# Patient Record
Sex: Female | Born: 1935 | Race: White | Hispanic: No | Smoking: Former smoker
Health system: Southern US, Community
[De-identification: ages and names within clinical notes are randomized; demographics above are authoritative.]

## PROBLEM LIST (undated history)

## (undated) DIAGNOSIS — I509 Heart failure, unspecified: Secondary | ICD-10-CM

## (undated) DIAGNOSIS — F419 Anxiety disorder, unspecified: Secondary | ICD-10-CM

## (undated) DIAGNOSIS — E039 Hypothyroidism, unspecified: Secondary | ICD-10-CM

## (undated) DIAGNOSIS — M1990 Unspecified osteoarthritis, unspecified site: Secondary | ICD-10-CM

## (undated) DIAGNOSIS — C4490 Unspecified malignant neoplasm of skin, unspecified: Secondary | ICD-10-CM

## (undated) DIAGNOSIS — J449 Chronic obstructive pulmonary disease, unspecified: Secondary | ICD-10-CM

## (undated) DIAGNOSIS — M109 Gout, unspecified: Secondary | ICD-10-CM

## (undated) DIAGNOSIS — I4891 Unspecified atrial fibrillation: Secondary | ICD-10-CM

## (undated) DIAGNOSIS — K219 Gastro-esophageal reflux disease without esophagitis: Secondary | ICD-10-CM

## (undated) DIAGNOSIS — N35919 Unspecified urethral stricture, male, unspecified site: Secondary | ICD-10-CM

## (undated) DIAGNOSIS — I10 Essential (primary) hypertension: Secondary | ICD-10-CM

## (undated) DIAGNOSIS — E785 Hyperlipidemia, unspecified: Secondary | ICD-10-CM

## (undated) DIAGNOSIS — F32A Depression, unspecified: Secondary | ICD-10-CM

## (undated) DIAGNOSIS — N390 Urinary tract infection, site not specified: Secondary | ICD-10-CM

## (undated) DIAGNOSIS — F329 Major depressive disorder, single episode, unspecified: Secondary | ICD-10-CM

## (undated) HISTORY — DX: Unspecified osteoarthritis, unspecified site: M19.90

## (undated) HISTORY — DX: Essential (primary) hypertension: I10

## (undated) HISTORY — DX: Major depressive disorder, single episode, unspecified: F32.9

## (undated) HISTORY — PX: OTHER SURGICAL HISTORY: SHX169

## (undated) HISTORY — DX: Hyperlipidemia, unspecified: E78.5

## (undated) HISTORY — DX: Unspecified malignant neoplasm of skin, unspecified: C44.90

## (undated) HISTORY — DX: Hypothyroidism, unspecified: E03.9

## (undated) HISTORY — DX: Anxiety disorder, unspecified: F41.9

## (undated) HISTORY — PX: PARTIAL HYSTERECTOMY: SHX80

## (undated) HISTORY — DX: Heart failure, unspecified: I50.9

## (undated) HISTORY — DX: Chronic obstructive pulmonary disease, unspecified: J44.9

## (undated) HISTORY — PX: CHOLECYSTECTOMY: SHX55

## (undated) HISTORY — DX: Depression, unspecified: F32.A

## (undated) HISTORY — DX: Gastro-esophageal reflux disease without esophagitis: K21.9

---

## 2004-10-16 ENCOUNTER — Ambulatory Visit: Payer: Self-pay | Admitting: Anesthesiology

## 2005-03-26 ENCOUNTER — Ambulatory Visit: Payer: Self-pay | Admitting: Internal Medicine

## 2006-12-02 ENCOUNTER — Emergency Department: Payer: Self-pay | Admitting: Unknown Physician Specialty

## 2006-12-02 ENCOUNTER — Other Ambulatory Visit: Payer: Self-pay

## 2009-02-13 ENCOUNTER — Ambulatory Visit: Payer: Self-pay | Admitting: Internal Medicine

## 2009-07-25 ENCOUNTER — Emergency Department: Payer: Self-pay | Admitting: Emergency Medicine

## 2011-01-08 ENCOUNTER — Ambulatory Visit: Payer: Self-pay | Admitting: Gastroenterology

## 2011-01-09 LAB — PATHOLOGY REPORT

## 2011-07-30 ENCOUNTER — Emergency Department: Payer: Self-pay | Admitting: Emergency Medicine

## 2011-08-03 ENCOUNTER — Ambulatory Visit: Payer: Self-pay | Admitting: Internal Medicine

## 2011-08-13 ENCOUNTER — Inpatient Hospital Stay: Payer: Self-pay | Admitting: Internal Medicine

## 2011-08-31 ENCOUNTER — Ambulatory Visit: Payer: Self-pay | Admitting: Internal Medicine

## 2011-09-03 ENCOUNTER — Ambulatory Visit: Payer: Self-pay | Admitting: Internal Medicine

## 2012-01-14 ENCOUNTER — Other Ambulatory Visit: Payer: Self-pay | Admitting: Gastroenterology

## 2012-01-14 LAB — CLOSTRIDIUM DIFFICILE BY PCR

## 2012-01-15 LAB — WBCS, STOOL

## 2012-03-10 ENCOUNTER — Ambulatory Visit: Payer: Self-pay | Admitting: Gastroenterology

## 2012-03-11 LAB — PATHOLOGY REPORT

## 2012-06-30 ENCOUNTER — Ambulatory Visit: Payer: Self-pay | Admitting: Internal Medicine

## 2012-07-15 ENCOUNTER — Ambulatory Visit: Payer: Self-pay | Admitting: Internal Medicine

## 2012-07-15 LAB — CBC CANCER CENTER
Basophil #: 0.1 x10 3/mm (ref 0.0–0.1)
Eosinophil #: 0.3 x10 3/mm (ref 0.0–0.7)
HCT: 31.7 % — ABNORMAL LOW (ref 35.0–47.0)
Lymphocyte #: 2.5 x10 3/mm (ref 1.0–3.6)
Lymphocyte %: 23 %
MCHC: 31.5 g/dL — ABNORMAL LOW (ref 32.0–36.0)
Monocyte %: 5 %
Neutrophil #: 7.4 x10 3/mm — ABNORMAL HIGH (ref 1.4–6.5)
Neutrophil %: 69.1 %
Platelet: 297 x10 3/mm (ref 150–440)
RBC: 3.59 10*6/uL — ABNORMAL LOW (ref 3.80–5.20)
RDW: 17.1 % — ABNORMAL HIGH (ref 11.5–14.5)
WBC: 10.8 x10 3/mm (ref 3.6–11.0)

## 2012-07-15 LAB — RETICULOCYTES: Absolute Retic Count: 0.0541 10*6/uL (ref 0.023–0.096)

## 2012-07-15 LAB — IRON AND TIBC
Iron Bind.Cap.(Total): 374 ug/dL (ref 250–450)
Iron: 33 ug/dL — ABNORMAL LOW (ref 50–170)
Unbound Iron-Bind.Cap.: 341 ug/dL

## 2012-08-02 ENCOUNTER — Ambulatory Visit: Payer: Self-pay | Admitting: Internal Medicine

## 2012-09-02 ENCOUNTER — Ambulatory Visit: Payer: Self-pay | Admitting: Internal Medicine

## 2012-12-31 ENCOUNTER — Ambulatory Visit: Payer: Self-pay | Admitting: Internal Medicine

## 2013-08-30 ENCOUNTER — Other Ambulatory Visit: Payer: Self-pay | Admitting: Gastroenterology

## 2013-08-31 ENCOUNTER — Ambulatory Visit: Payer: Self-pay | Admitting: Gastroenterology

## 2013-10-16 ENCOUNTER — Inpatient Hospital Stay: Payer: Self-pay | Admitting: Internal Medicine

## 2013-10-16 LAB — IRON AND TIBC
Iron Bind.Cap.(Total): 393 ug/dL (ref 250–450)
Iron Saturation: 7 %
Unbound Iron-Bind.Cap.: 367 ug/dL

## 2013-10-16 LAB — PRO B NATRIURETIC PEPTIDE: B-Type Natriuretic Peptide: 270 pg/mL (ref 0–450)

## 2013-10-16 LAB — COMPREHENSIVE METABOLIC PANEL
Albumin: 3.3 g/dL — ABNORMAL LOW (ref 3.4–5.0)
Anion Gap: 4 — ABNORMAL LOW (ref 7–16)
BUN: 19 mg/dL — ABNORMAL HIGH (ref 7–18)
Bilirubin,Total: 0.3 mg/dL (ref 0.2–1.0)
Calcium, Total: 9.7 mg/dL (ref 8.5–10.1)
Chloride: 99 mmol/L (ref 98–107)
Co2: 33 mmol/L — ABNORMAL HIGH (ref 21–32)
Creatinine: 1.24 mg/dL (ref 0.60–1.30)
EGFR (African American): 49 — ABNORMAL LOW
Glucose: 102 mg/dL — ABNORMAL HIGH (ref 65–99)
Osmolality: 274 (ref 275–301)
SGOT(AST): 14 U/L — ABNORMAL LOW (ref 15–37)
Sodium: 136 mmol/L (ref 136–145)
Total Protein: 7.9 g/dL (ref 6.4–8.2)

## 2013-10-16 LAB — CBC
HCT: 25 % — ABNORMAL LOW (ref 35.0–47.0)
HGB: 7.8 g/dL — ABNORMAL LOW (ref 12.0–16.0)
MCH: 26.1 pg (ref 26.0–34.0)
MCV: 83 fL (ref 80–100)
RDW: 17.3 % — ABNORMAL HIGH (ref 11.5–14.5)

## 2013-10-16 LAB — FERRITIN: Ferritin (ARMC): 9 ng/mL (ref 8–388)

## 2013-10-16 LAB — TROPONIN I: Troponin-I: <0.02 ng/mL

## 2013-10-17 LAB — CBC WITH DIFFERENTIAL/PLATELET
Basophil #: 0 10*3/uL (ref 0.0–0.1)
Eosinophil #: 0 10*3/uL (ref 0.0–0.7)
Eosinophil %: 0 %
HCT: 23.1 % — ABNORMAL LOW (ref 35.0–47.0)
HGB: 7.4 g/dL — ABNORMAL LOW (ref 12.0–16.0)
Lymphocyte #: 1.2 10*3/uL (ref 1.0–3.6)
MCH: 26.1 pg (ref 26.0–34.0)
MCHC: 32 g/dL (ref 32.0–36.0)
Monocyte #: 0.1 x10 3/mm — ABNORMAL LOW (ref 0.2–0.9)
Monocyte %: 0.6 %
Neutrophil #: 13.8 10*3/uL — ABNORMAL HIGH (ref 1.4–6.5)
Platelet: 353 10*3/uL (ref 150–440)
RDW: 17.7 % — ABNORMAL HIGH (ref 11.5–14.5)
WBC: 15.2 10*3/uL — ABNORMAL HIGH (ref 3.6–11.0)

## 2013-10-17 LAB — BASIC METABOLIC PANEL
Anion Gap: 7 (ref 7–16)
BUN: 19 mg/dL — ABNORMAL HIGH (ref 7–18)
Calcium, Total: 9.9 mg/dL (ref 8.5–10.1)
Co2: 30 mmol/L (ref 21–32)
EGFR (African American): 57 — ABNORMAL LOW
Glucose: 144 mg/dL — ABNORMAL HIGH (ref 65–99)
Osmolality: 273 (ref 275–301)
Potassium: 4.5 mmol/L (ref 3.5–5.1)
Sodium: 134 mmol/L — ABNORMAL LOW (ref 136–145)

## 2013-10-18 LAB — BASIC METABOLIC PANEL
Anion Gap: 4 — ABNORMAL LOW (ref 7–16)
Chloride: 94 mmol/L — ABNORMAL LOW (ref 98–107)
Co2: 32 mmol/L (ref 21–32)
EGFR (African American): 48 — ABNORMAL LOW
EGFR (Non-African Amer.): 41 — ABNORMAL LOW
Glucose: 164 mg/dL — ABNORMAL HIGH (ref 65–99)
Osmolality: 271 (ref 275–301)
Potassium: 4 mmol/L (ref 3.5–5.1)
Sodium: 130 mmol/L — ABNORMAL LOW (ref 136–145)

## 2013-10-18 LAB — CBC WITH DIFFERENTIAL/PLATELET
Eosinophil #: 0 10*3/uL (ref 0.0–0.7)
HCT: 22.8 % — ABNORMAL LOW (ref 35.0–47.0)
HGB: 6.8 g/dL — ABNORMAL LOW (ref 12.0–16.0)
Lymphocyte #: 1.8 10*3/uL (ref 1.0–3.6)
MCV: 82 fL (ref 80–100)
Monocyte #: 0.6 x10 3/mm (ref 0.2–0.9)
Neutrophil %: 86.5 %
RDW: 17.5 % — ABNORMAL HIGH (ref 11.5–14.5)
WBC: 18.3 10*3/uL — ABNORMAL HIGH (ref 3.6–11.0)

## 2013-10-18 LAB — IRON AND TIBC
Iron Bind.Cap.(Total): 331 ug/dL (ref 250–450)
Iron Saturation: 10 %
Iron: 32 ug/dL — ABNORMAL LOW (ref 50–170)
Unbound Iron-Bind.Cap.: 299 ug/dL

## 2013-10-18 LAB — FOLATE: Folic Acid: 10 ng/mL (ref 3.1–100.0)

## 2013-10-19 LAB — RETICULOCYTES
Absolute Retic Count: 0.069 10*6/uL (ref 0.019–0.186)
Reticulocyte: 2.18 % (ref 0.4–3.1)

## 2013-10-19 LAB — BASIC METABOLIC PANEL
Calcium, Total: 9.3 mg/dL (ref 8.5–10.1)
Chloride: 95 mmol/L — ABNORMAL LOW (ref 98–107)
Co2: 31 mmol/L (ref 21–32)
EGFR (African American): 51 — ABNORMAL LOW
Glucose: 172 mg/dL — ABNORMAL HIGH (ref 65–99)
Osmolality: 277 (ref 275–301)
Potassium: 3.9 mmol/L (ref 3.5–5.1)

## 2013-10-19 LAB — URINALYSIS, COMPLETE
Glucose,UR: NEGATIVE mg/dL (ref 0–75)
Nitrite: NEGATIVE
Protein: NEGATIVE
Specific Gravity: 1.006 (ref 1.003–1.030)
WBC UR: 8 /HPF (ref 0–5)

## 2013-10-19 LAB — CBC WITH DIFFERENTIAL/PLATELET
Basophil #: 0 10*3/uL (ref 0.0–0.1)
Basophil %: 0.1 %
Eosinophil #: 0 10*3/uL (ref 0.0–0.7)
HCT: 26 % — ABNORMAL LOW (ref 35.0–47.0)
HGB: 8.2 g/dL — ABNORMAL LOW (ref 12.0–16.0)
Lymphocyte %: 9.9 %
MCH: 25.9 pg — ABNORMAL LOW (ref 26.0–34.0)
MCHC: 31.5 g/dL — ABNORMAL LOW (ref 32.0–36.0)
MCV: 82 fL (ref 80–100)
Monocyte #: 0.7 x10 3/mm (ref 0.2–0.9)
Neutrophil #: 15.3 10*3/uL — ABNORMAL HIGH (ref 1.4–6.5)
Neutrophil %: 86.3 %
Platelet: 379 10*3/uL (ref 150–440)
RBC: 3.15 10*6/uL — ABNORMAL LOW (ref 3.80–5.20)
WBC: 17.7 10*3/uL — ABNORMAL HIGH (ref 3.6–11.0)

## 2013-10-19 LAB — T4, FREE: Free Thyroxine: 1.1 ng/dL (ref 0.76–1.46)

## 2013-10-20 LAB — CBC WITH DIFFERENTIAL/PLATELET
HCT: 28.7 % — ABNORMAL LOW (ref 35.0–47.0)
Lymphocytes: 17 %
MCH: 26 pg (ref 26.0–34.0)
MCV: 83 fL (ref 80–100)
Monocytes: 6 %
Myelocyte: 2 %
Platelet: 450 10*3/uL — ABNORMAL HIGH (ref 150–440)
RBC: 3.46 10*6/uL — ABNORMAL LOW (ref 3.80–5.20)
WBC: 25.6 10*3/uL — ABNORMAL HIGH (ref 3.6–11.0)

## 2013-10-20 LAB — BASIC METABOLIC PANEL
Anion Gap: 5 — ABNORMAL LOW (ref 7–16)
Calcium, Total: 9.4 mg/dL (ref 8.5–10.1)
Creatinine: 1.14 mg/dL (ref 0.60–1.30)
EGFR (Non-African Amer.): 47 — ABNORMAL LOW
Glucose: 130 mg/dL — ABNORMAL HIGH (ref 65–99)
Osmolality: 285 (ref 275–301)

## 2013-10-21 LAB — CBC WITH DIFFERENTIAL/PLATELET
Basophil #: 0.1 10*3/uL (ref 0.0–0.1)
Eosinophil #: 0 10*3/uL (ref 0.0–0.7)
Eosinophil %: 0.1 %
HCT: 28.3 % — ABNORMAL LOW (ref 35.0–47.0)
HGB: 8.6 g/dL — ABNORMAL LOW (ref 12.0–16.0)
Lymphocyte #: 4.4 10*3/uL — ABNORMAL HIGH (ref 1.0–3.6)
Lymphocyte %: 15.9 %
MCH: 25.3 pg — ABNORMAL LOW (ref 26.0–34.0)
MCHC: 30.2 g/dL — ABNORMAL LOW (ref 32.0–36.0)
MCV: 84 fL (ref 80–100)
Monocyte #: 2.2 x10 3/mm — ABNORMAL HIGH (ref 0.2–0.9)
Monocyte %: 7.8 %
Neutrophil #: 21.1 10*3/uL — ABNORMAL HIGH (ref 1.4–6.5)
Neutrophil %: 75.7 %
RBC: 3.37 10*6/uL — ABNORMAL LOW (ref 3.80–5.20)
WBC: 27.9 10*3/uL — ABNORMAL HIGH (ref 3.6–11.0)

## 2013-10-21 LAB — BASIC METABOLIC PANEL WITH GFR
Anion Gap: 4 — ABNORMAL LOW (ref 7–16)
BUN: 38 mg/dL — ABNORMAL HIGH (ref 7–18)
Calcium, Total: 9.1 mg/dL (ref 8.5–10.1)
Chloride: 100 mmol/L (ref 98–107)
Co2: 32 mmol/L (ref 21–32)
Creatinine: 1.16 mg/dL (ref 0.60–1.30)
EGFR (African American): 53 — ABNORMAL LOW
EGFR (Non-African Amer.): 46 — ABNORMAL LOW
Glucose: 126 mg/dL — ABNORMAL HIGH (ref 65–99)
Osmolality: 283 (ref 275–301)
Potassium: 3.9 mmol/L (ref 3.5–5.1)
Sodium: 136 mmol/L (ref 136–145)

## 2013-10-21 LAB — CULTURE, BLOOD (SINGLE)

## 2013-10-22 DIAGNOSIS — R079 Chest pain, unspecified: Secondary | ICD-10-CM

## 2013-10-22 LAB — TROPONIN I: Troponin-I: <0.02 ng/mL

## 2013-10-22 LAB — CK TOTAL AND CKMB (NOT AT ARMC): CK, Total: 41 U/L (ref 21–215)

## 2013-10-23 LAB — CK TOTAL AND CKMB (NOT AT ARMC)
CK, Total: 33 U/L (ref 21–215)
CK-MB: 0.8 ng/mL (ref 0.5–3.6)

## 2013-10-23 LAB — CBC WITH DIFFERENTIAL/PLATELET
Basophil #: 0.1 10*3/uL (ref 0.0–0.1)
Eosinophil #: 0.3 10*3/uL (ref 0.0–0.7)
Eosinophil %: 0.9 %
MCHC: 30.2 g/dL — ABNORMAL LOW (ref 32.0–36.0)
Monocyte #: 1.8 x10 3/mm — ABNORMAL HIGH (ref 0.2–0.9)
Neutrophil #: 22.3 10*3/uL — ABNORMAL HIGH (ref 1.4–6.5)
RDW: 17.6 % — ABNORMAL HIGH (ref 11.5–14.5)
WBC: 29.3 10*3/uL — ABNORMAL HIGH (ref 3.6–11.0)

## 2013-10-23 LAB — BASIC METABOLIC PANEL
Anion Gap: 2 — ABNORMAL LOW (ref 7–16)
Calcium, Total: 9.1 mg/dL (ref 8.5–10.1)
EGFR (African American): 58 — ABNORMAL LOW
Glucose: 99 mg/dL (ref 65–99)
Potassium: 3.6 mmol/L (ref 3.5–5.1)
Sodium: 136 mmol/L (ref 136–145)

## 2013-10-23 LAB — TROPONIN I
Troponin-I: <0.02 ng/mL
Troponin-I: <0.02 ng/mL

## 2013-10-24 LAB — CBC WITH DIFFERENTIAL/PLATELET
Eosinophil: 2 %
HCT: 24.8 % — ABNORMAL LOW (ref 35.0–47.0)
MCH: 26.6 pg (ref 26.0–34.0)
Monocytes: 5 %
Platelet: 342 10*3/uL (ref 150–440)
RDW: 18 % — ABNORMAL HIGH (ref 11.5–14.5)
Segmented Neutrophils: 78 %
WBC: 21.9 10*3/uL — ABNORMAL HIGH (ref 3.6–11.0)

## 2013-10-24 LAB — BASIC METABOLIC PANEL
Anion Gap: 3 — ABNORMAL LOW (ref 7–16)
Calcium, Total: 8.8 mg/dL (ref 8.5–10.1)
Chloride: 104 mmol/L (ref 98–107)
Co2: 30 mmol/L (ref 21–32)
EGFR (African American): >60
Glucose: 119 mg/dL — ABNORMAL HIGH (ref 65–99)
Osmolality: 280 (ref 275–301)

## 2013-11-16 ENCOUNTER — Inpatient Hospital Stay: Payer: Self-pay | Admitting: Internal Medicine

## 2013-11-16 LAB — COMPREHENSIVE METABOLIC PANEL
ANION GAP: 6 — AB (ref 7–16)
Albumin: 3.1 g/dL — ABNORMAL LOW (ref 3.4–5.0)
Alkaline Phosphatase: 133 U/L — ABNORMAL HIGH
BUN: 34 mg/dL — AB (ref 7–18)
Bilirubin,Total: 0.4 mg/dL (ref 0.2–1.0)
Calcium, Total: 9.8 mg/dL (ref 8.5–10.1)
Chloride: 102 mmol/L (ref 98–107)
Co2: 29 mmol/L (ref 21–32)
Creatinine: 1.44 mg/dL — ABNORMAL HIGH (ref 0.60–1.30)
EGFR (African American): 41 — ABNORMAL LOW
GFR CALC NON AF AMER: 35 — AB
Glucose: 107 mg/dL — ABNORMAL HIGH (ref 65–99)
OSMOLALITY: 282 (ref 275–301)
POTASSIUM: 3.8 mmol/L (ref 3.5–5.1)
SGOT(AST): 30 U/L (ref 15–37)
SGPT (ALT): 27 U/L (ref 12–78)
Sodium: 137 mmol/L (ref 136–145)
Total Protein: 7.5 g/dL (ref 6.4–8.2)

## 2013-11-16 LAB — CBC WITH DIFFERENTIAL/PLATELET
BASOS ABS: 0.1 10*3/uL (ref 0.0–0.1)
Basophil %: 0.8 %
EOS PCT: 3.1 %
Eosinophil #: 0.3 10*3/uL (ref 0.0–0.7)
HCT: 30.1 % — ABNORMAL LOW (ref 35.0–47.0)
HGB: 9.4 g/dL — ABNORMAL LOW (ref 12.0–16.0)
Lymphocyte #: 2.7 10*3/uL (ref 1.0–3.6)
Lymphocyte %: 24.5 %
MCH: 26.4 pg (ref 26.0–34.0)
MCHC: 31.2 g/dL — ABNORMAL LOW (ref 32.0–36.0)
MCV: 85 fL (ref 80–100)
Monocyte #: 0.7 x10 3/mm (ref 0.2–0.9)
Monocyte %: 6.4 %
NEUTROS ABS: 7.3 10*3/uL — AB (ref 1.4–6.5)
Neutrophil %: 65.2 %
Platelet: 436 10*3/uL (ref 150–440)
RBC: 3.56 10*6/uL — ABNORMAL LOW (ref 3.80–5.20)
RDW: 18.7 % — ABNORMAL HIGH (ref 11.5–14.5)
WBC: 11.2 10*3/uL — ABNORMAL HIGH (ref 3.6–11.0)

## 2013-11-16 LAB — TROPONIN I
Troponin-I: <0.02 ng/mL
Troponin-I: <0.02 ng/mL

## 2013-11-16 LAB — CK-MB
CK-MB: 0.7 ng/mL (ref 0.5–3.6)
CK-MB: 0.5 ng/mL (ref 0.5–3.6)

## 2013-11-17 LAB — URINALYSIS, COMPLETE
BILIRUBIN, UR: NEGATIVE
GLUCOSE, UR: NEGATIVE mg/dL (ref 0–75)
Hyaline Cast: 15
Ketone: NEGATIVE
LEUKOCYTE ESTERASE: NEGATIVE
Nitrite: NEGATIVE
Ph: 5 (ref 4.5–8.0)
Protein: NEGATIVE
SPECIFIC GRAVITY: 1.014 (ref 1.003–1.030)
Squamous Epithelial: 10
WBC UR: 2 /HPF (ref 0–5)

## 2013-11-17 LAB — CBC WITH DIFFERENTIAL/PLATELET
BASOS ABS: 0.1 10*3/uL (ref 0.0–0.1)
BASOS PCT: 0.7 %
Eosinophil #: 0.3 10*3/uL (ref 0.0–0.7)
Eosinophil %: 3 %
HCT: 25.3 % — AB (ref 35.0–47.0)
HGB: 7.8 g/dL — AB (ref 12.0–16.0)
Lymphocyte #: 3.5 10*3/uL (ref 1.0–3.6)
Lymphocyte %: 31.8 %
MCH: 25.8 pg — ABNORMAL LOW (ref 26.0–34.0)
MCHC: 30.7 g/dL — AB (ref 32.0–36.0)
MCV: 84 fL (ref 80–100)
MONO ABS: 0.9 x10 3/mm (ref 0.2–0.9)
MONOS PCT: 8.6 %
NEUTROS PCT: 55.9 %
Neutrophil #: 6.2 10*3/uL (ref 1.4–6.5)
Platelet: 357 10*3/uL (ref 150–440)
RBC: 3.01 10*6/uL — AB (ref 3.80–5.20)
RDW: 18.3 % — AB (ref 11.5–14.5)
WBC: 11 10*3/uL (ref 3.6–11.0)

## 2013-11-17 LAB — BASIC METABOLIC PANEL
Anion Gap: 5 — ABNORMAL LOW (ref 7–16)
BUN: 37 mg/dL — ABNORMAL HIGH (ref 7–18)
CALCIUM: 8.8 mg/dL (ref 8.5–10.1)
CHLORIDE: 104 mmol/L (ref 98–107)
Co2: 29 mmol/L (ref 21–32)
Creatinine: 1.84 mg/dL — ABNORMAL HIGH (ref 0.60–1.30)
EGFR (African American): 30 — ABNORMAL LOW
EGFR (Non-African Amer.): 26 — ABNORMAL LOW
Glucose: 95 mg/dL (ref 65–99)
OSMOLALITY: 284 (ref 275–301)
POTASSIUM: 3.5 mmol/L (ref 3.5–5.1)
SODIUM: 138 mmol/L (ref 136–145)

## 2013-11-17 LAB — TROPONIN I

## 2013-11-17 LAB — CK-MB: CK-MB: 0.7 ng/mL (ref 0.5–3.6)

## 2013-11-18 LAB — CBC WITH DIFFERENTIAL/PLATELET
Basophil #: 0.1 10*3/uL (ref 0.0–0.1)
Basophil %: 0.5 %
EOS ABS: 0.4 10*3/uL (ref 0.0–0.7)
Eosinophil %: 2.9 %
HCT: 21.9 % — ABNORMAL LOW (ref 35.0–47.0)
HGB: 7 g/dL — AB (ref 12.0–16.0)
LYMPHS PCT: 28.4 %
Lymphocyte #: 3.7 10*3/uL — ABNORMAL HIGH (ref 1.0–3.6)
MCH: 26.7 pg (ref 26.0–34.0)
MCHC: 31.9 g/dL — ABNORMAL LOW (ref 32.0–36.0)
MCV: 84 fL (ref 80–100)
MONOS PCT: 7.5 %
Monocyte #: 1 x10 3/mm — ABNORMAL HIGH (ref 0.2–0.9)
NEUTROS ABS: 7.8 10*3/uL — AB (ref 1.4–6.5)
Neutrophil %: 60.7 %
Platelet: 333 10*3/uL (ref 150–440)
RBC: 2.62 10*6/uL — ABNORMAL LOW (ref 3.80–5.20)
RDW: 18.3 % — ABNORMAL HIGH (ref 11.5–14.5)
WBC: 12.9 10*3/uL — ABNORMAL HIGH (ref 3.6–11.0)

## 2013-11-18 LAB — CREATININE, SERUM
Creatinine: 2.67 mg/dL — ABNORMAL HIGH (ref 0.60–1.30)
EGFR (Non-African Amer.): 17 — ABNORMAL LOW
GFR CALC AF AMER: 19 — AB

## 2013-11-18 LAB — URINE CULTURE

## 2013-11-19 LAB — BASIC METABOLIC PANEL
Anion Gap: 8 (ref 7–16)
BUN: 53 mg/dL — ABNORMAL HIGH (ref 7–18)
CALCIUM: 8.8 mg/dL (ref 8.5–10.1)
CHLORIDE: 101 mmol/L (ref 98–107)
Co2: 25 mmol/L (ref 21–32)
Creatinine: 1.96 mg/dL — ABNORMAL HIGH (ref 0.60–1.30)
EGFR (African American): 28 — ABNORMAL LOW
EGFR (Non-African Amer.): 24 — ABNORMAL LOW
GLUCOSE: 87 mg/dL (ref 65–99)
Osmolality: 282 (ref 275–301)
POTASSIUM: 4.4 mmol/L (ref 3.5–5.1)
Sodium: 134 mmol/L — ABNORMAL LOW (ref 136–145)

## 2013-11-19 LAB — CBC WITH DIFFERENTIAL/PLATELET
BASOS ABS: 0.1 10*3/uL (ref 0.0–0.1)
Basophil %: 0.5 %
Eosinophil #: 0.4 10*3/uL (ref 0.0–0.7)
Eosinophil %: 2.9 %
HCT: 24.8 % — ABNORMAL LOW (ref 35.0–47.0)
HGB: 8 g/dL — ABNORMAL LOW (ref 12.0–16.0)
Lymphocyte #: 3.7 10*3/uL — ABNORMAL HIGH (ref 1.0–3.6)
Lymphocyte %: 28.5 %
MCH: 26.9 pg (ref 26.0–34.0)
MCHC: 32.3 g/dL (ref 32.0–36.0)
MCV: 83 fL (ref 80–100)
MONOS PCT: 7.6 %
Monocyte #: 1 x10 3/mm — ABNORMAL HIGH (ref 0.2–0.9)
Neutrophil #: 7.9 10*3/uL — ABNORMAL HIGH (ref 1.4–6.5)
Neutrophil %: 60.5 %
Platelet: 337 10*3/uL (ref 150–440)
RBC: 2.97 10*6/uL — AB (ref 3.80–5.20)
RDW: 18.6 % — AB (ref 11.5–14.5)
WBC: 13.1 10*3/uL — ABNORMAL HIGH (ref 3.6–11.0)

## 2013-11-20 LAB — CBC WITH DIFFERENTIAL/PLATELET
BASOS ABS: 0.1 10*3/uL (ref 0.0–0.1)
Basophil %: 0.6 %
EOS PCT: 3.2 %
Eosinophil #: 0.4 10*3/uL (ref 0.0–0.7)
HCT: 26.2 % — ABNORMAL LOW (ref 35.0–47.0)
HGB: 8.1 g/dL — AB (ref 12.0–16.0)
Lymphocyte #: 2.6 10*3/uL (ref 1.0–3.6)
Lymphocyte %: 23.2 %
MCH: 26.4 pg (ref 26.0–34.0)
MCHC: 30.9 g/dL — ABNORMAL LOW (ref 32.0–36.0)
MCV: 86 fL (ref 80–100)
Monocyte #: 1 x10 3/mm — ABNORMAL HIGH (ref 0.2–0.9)
Monocyte %: 9 %
NEUTROS ABS: 7.2 10*3/uL — AB (ref 1.4–6.5)
NEUTROS PCT: 64 %
PLATELETS: 352 10*3/uL (ref 150–440)
RBC: 3.06 10*6/uL — ABNORMAL LOW (ref 3.80–5.20)
RDW: 18.6 % — ABNORMAL HIGH (ref 11.5–14.5)
WBC: 11.2 10*3/uL — ABNORMAL HIGH (ref 3.6–11.0)

## 2013-11-20 LAB — BASIC METABOLIC PANEL
ANION GAP: 4 — AB (ref 7–16)
BUN: 42 mg/dL — ABNORMAL HIGH (ref 7–18)
CREATININE: 1.46 mg/dL — AB (ref 0.60–1.30)
Calcium, Total: 9.6 mg/dL (ref 8.5–10.1)
Chloride: 105 mmol/L (ref 98–107)
Co2: 27 mmol/L (ref 21–32)
EGFR (African American): 40 — ABNORMAL LOW
EGFR (Non-African Amer.): 35 — ABNORMAL LOW
Glucose: 99 mg/dL (ref 65–99)
Osmolality: 282 (ref 275–301)
Potassium: 4.7 mmol/L (ref 3.5–5.1)
Sodium: 136 mmol/L (ref 136–145)

## 2013-11-21 LAB — BASIC METABOLIC PANEL
ANION GAP: 4 — AB (ref 7–16)
BUN: 31 mg/dL — ABNORMAL HIGH (ref 7–18)
Calcium, Total: 9.6 mg/dL (ref 8.5–10.1)
Chloride: 105 mmol/L (ref 98–107)
Co2: 27 mmol/L (ref 21–32)
Creatinine: 1.13 mg/dL (ref 0.60–1.30)
GFR CALC AF AMER: 55 — AB
GFR CALC NON AF AMER: 47 — AB
GLUCOSE: 100 mg/dL — AB (ref 65–99)
Osmolality: 279 (ref 275–301)
Potassium: 4.8 mmol/L (ref 3.5–5.1)
Sodium: 136 mmol/L (ref 136–145)

## 2013-11-21 LAB — CBC WITH DIFFERENTIAL/PLATELET
BASOS PCT: 0.5 %
Basophil #: 0.1 10*3/uL (ref 0.0–0.1)
EOS ABS: 0.3 10*3/uL (ref 0.0–0.7)
Eosinophil %: 3.4 %
HCT: 27.3 % — AB (ref 35.0–47.0)
HGB: 8.6 g/dL — AB (ref 12.0–16.0)
LYMPHS PCT: 24.9 %
Lymphocyte #: 2.6 10*3/uL (ref 1.0–3.6)
MCH: 27.1 pg (ref 26.0–34.0)
MCHC: 31.6 g/dL — AB (ref 32.0–36.0)
MCV: 86 fL (ref 80–100)
MONOS PCT: 10.1 %
Monocyte #: 1.1 x10 3/mm — ABNORMAL HIGH (ref 0.2–0.9)
NEUTROS ABS: 6.4 10*3/uL (ref 1.4–6.5)
NEUTROS PCT: 61.1 %
Platelet: 359 10*3/uL (ref 150–440)
RBC: 3.18 10*6/uL — ABNORMAL LOW (ref 3.80–5.20)
RDW: 18.4 % — AB (ref 11.5–14.5)
WBC: 10.4 10*3/uL (ref 3.6–11.0)

## 2014-04-05 ENCOUNTER — Ambulatory Visit: Payer: Self-pay | Admitting: Pain Medicine

## 2014-04-10 IMAGING — CR RIGHT HIP - COMPLETE 2+ VIEW
1 series · 5 of 5 positions shown · non-contrast
Comparison: None.

CLINICAL DATA: Pain status post fall

EXAM:
RIGHT HIP - COMPLETE 2+ VIEW

[Series 1: t pelvis ap · 0.14mm/px · 5 of 5 slices shown]
[im 1/5]
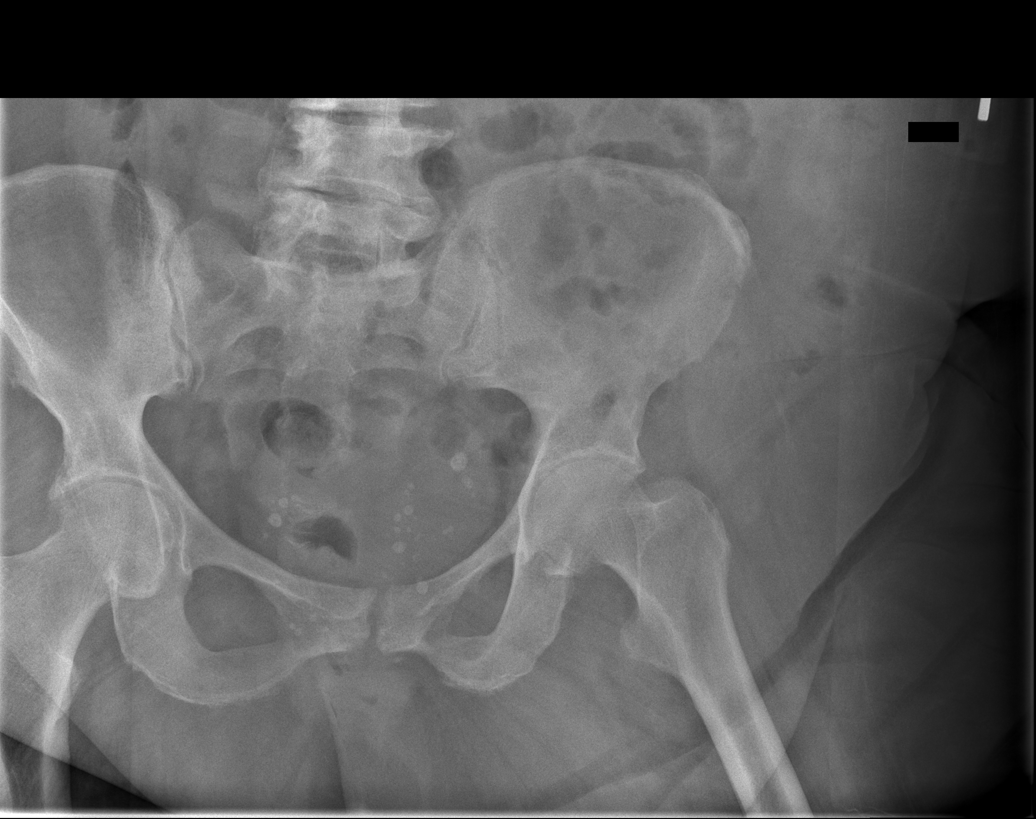
[im 2/5]
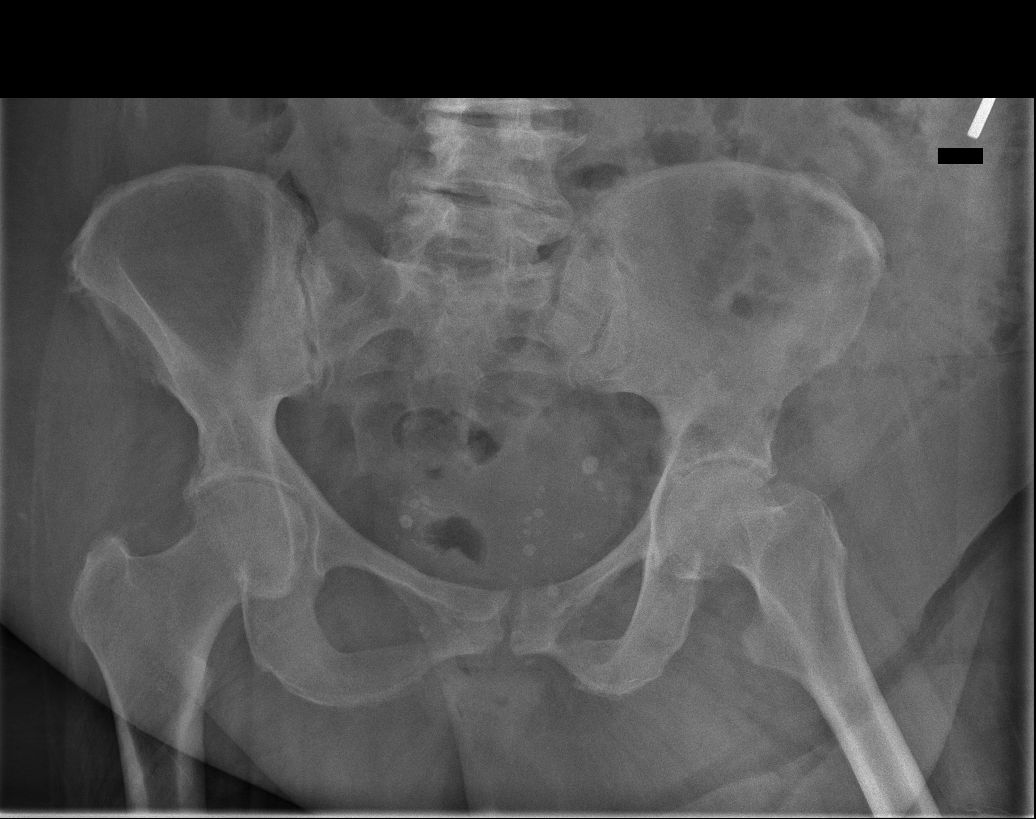
[im 3/5]
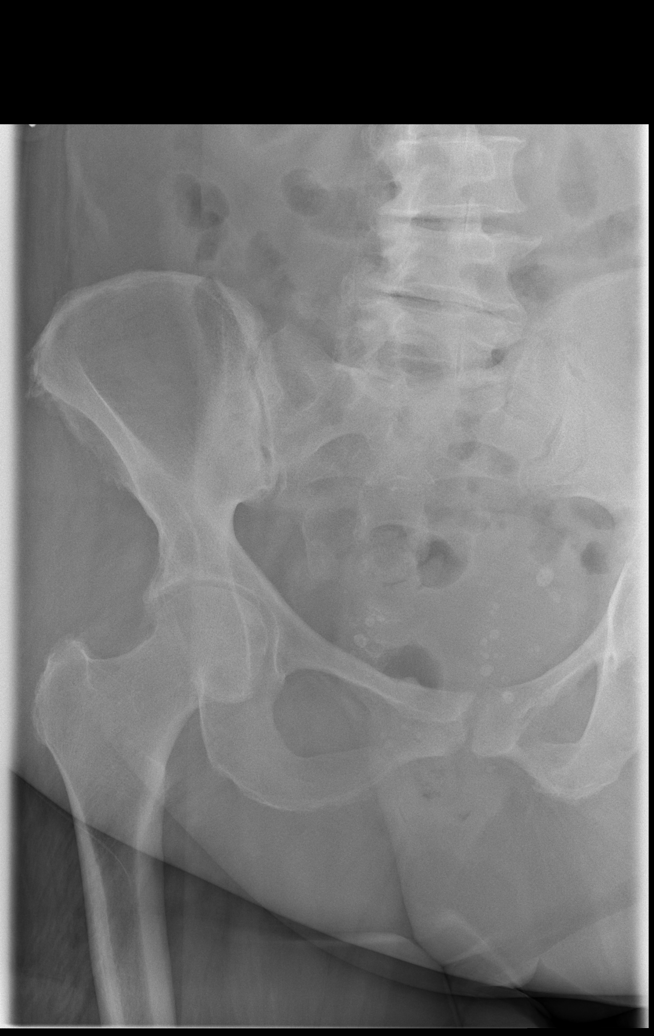
[im 4/5]
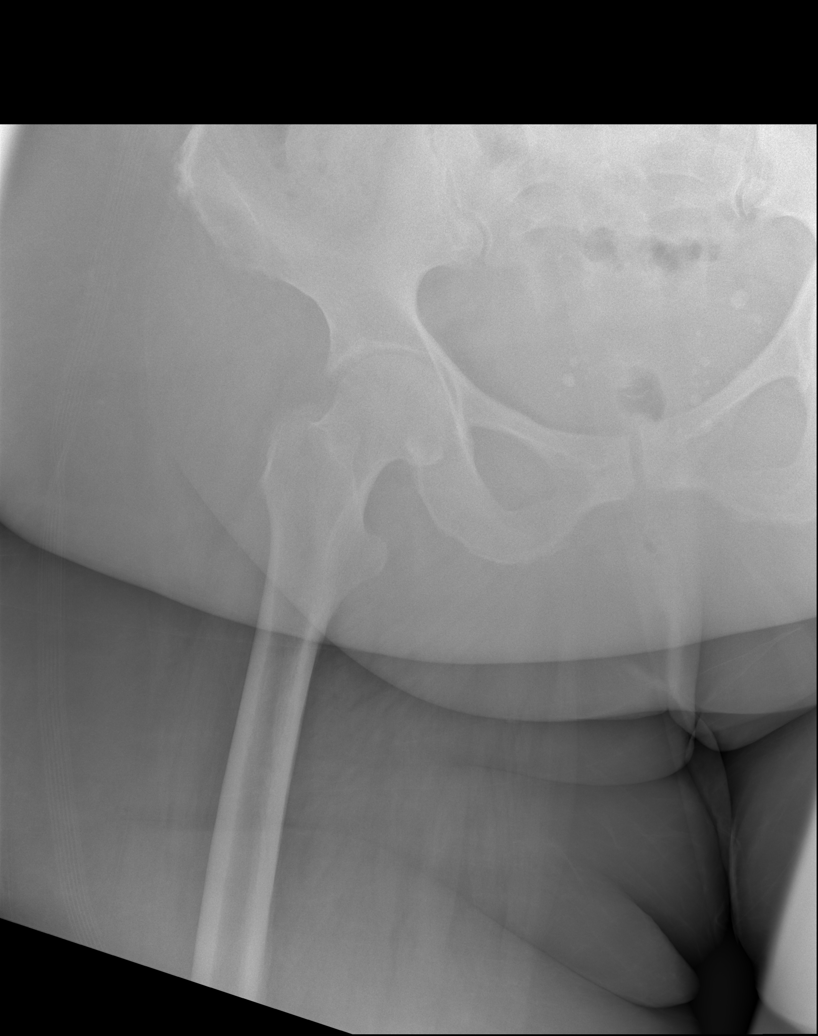
[im 5/5]
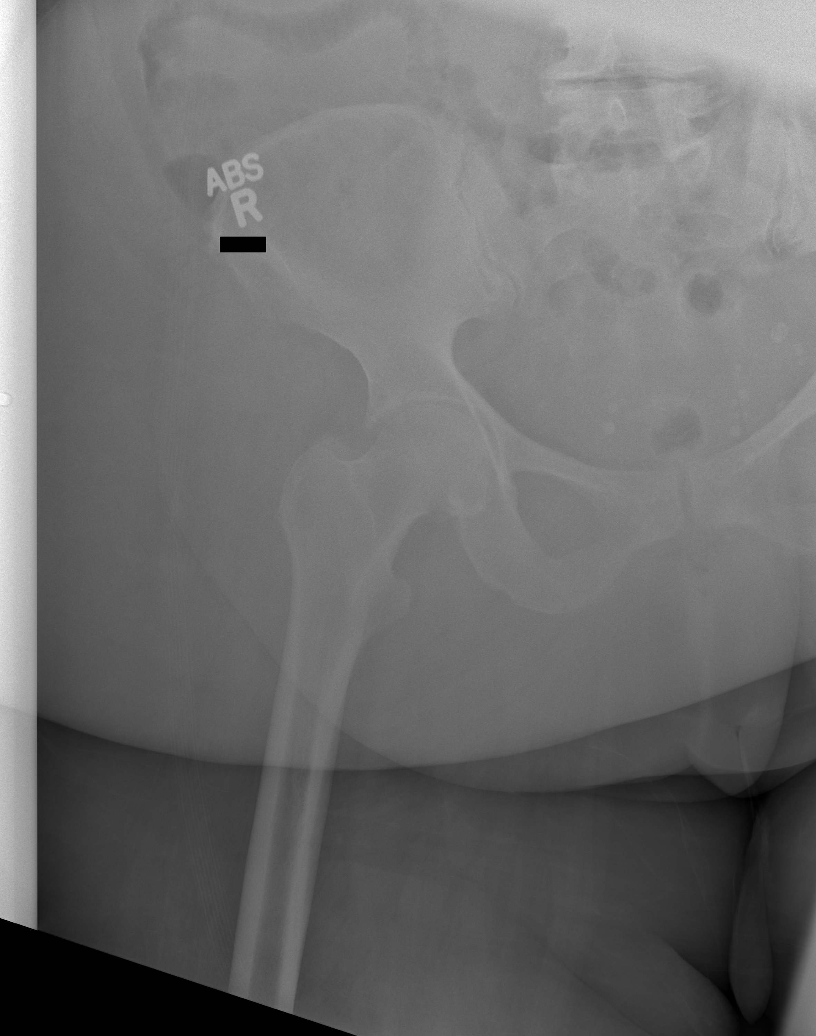

[5 of 5 positions shown; findings below may reference images not displayed]

FINDINGS: There is no evidence of hip fracture or dislocation. There is short
joint space narrowing, subchondral sclerosis and areas of
hypertrophic mild spurring.
IMPRESSION: No evidence of acute osseous abnormalities. Osteoarthritic changes
are appreciated.

## 2014-04-10 IMAGING — CR DG CHEST 1V
1 series · 1 of 1 positions shown · non-contrast
Comparison: Two-view chest x-ray 10/19/2013. Portable chest x-ray
10/16/2013. CTA chest 08/10/2011.

CLINICAL DATA: Recent fall.  Shortness of breath.

EXAM:
CHEST - 1 VIEW

[t chest supine]
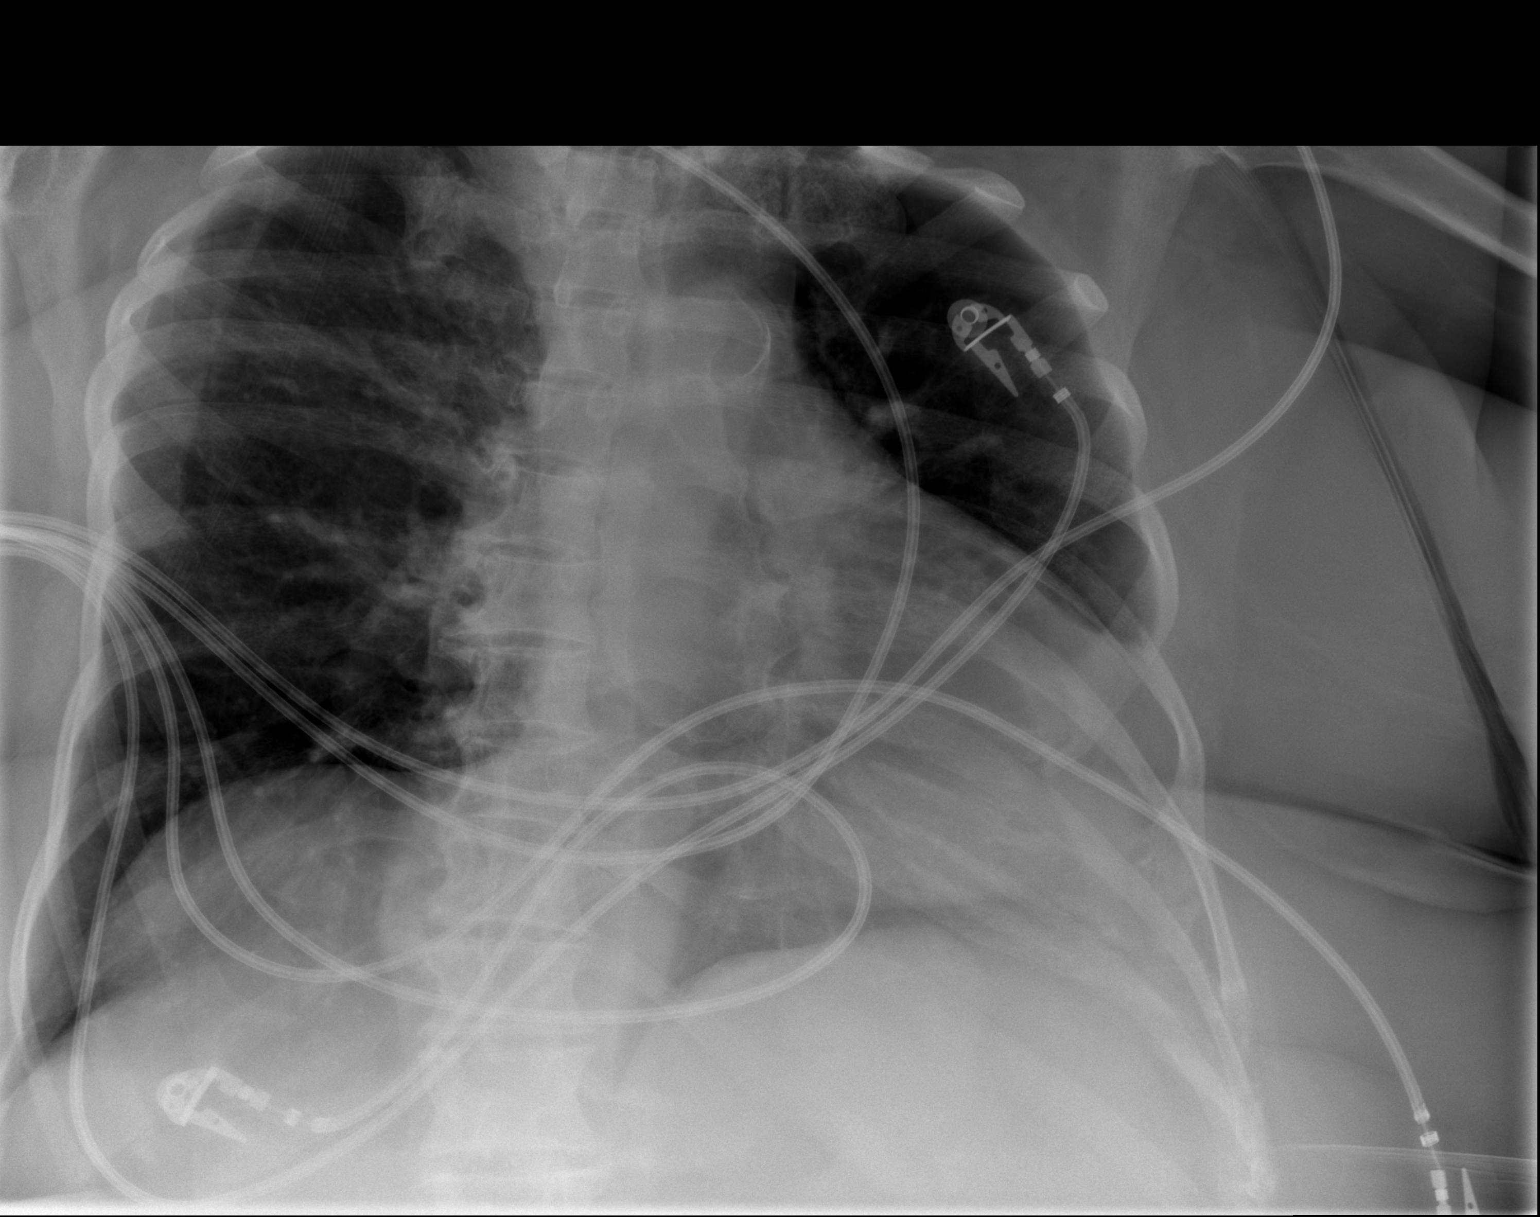

[1 of 1 positions shown; findings below may reference images not displayed]

FINDINGS: Suboptimal inspiration due to body habitus accounts for crowded
bronchovascular markings, especially in the bases, and accentuates
the cardiac silhouette. Taken is not account, cardiac silhouette
markedly enlarged but stable. Streaky opacities in the right upper
lobe. Lungs otherwise clear. Eventration of the right hemidiaphragm
as noted previously.
IMPRESSION: Suboptimal inspiration. Atelectasis versus bronchopneumonia
involving the right upper lobe. Stable marked cardiomegaly without
pulmonary edema.

## 2014-04-10 IMAGING — CR DG LUMBAR SPINE 2-3V
1 series · 4 of 4 positions shown · non-contrast
Comparison: None.

CLINICAL DATA: Pain status post fall

EXAM:
LUMBAR SPINE - 2-3 VIEW

[Series 1: t lumbar spine ap · 0.14mm/px · 4 of 4 slices shown]
[im 1/4]
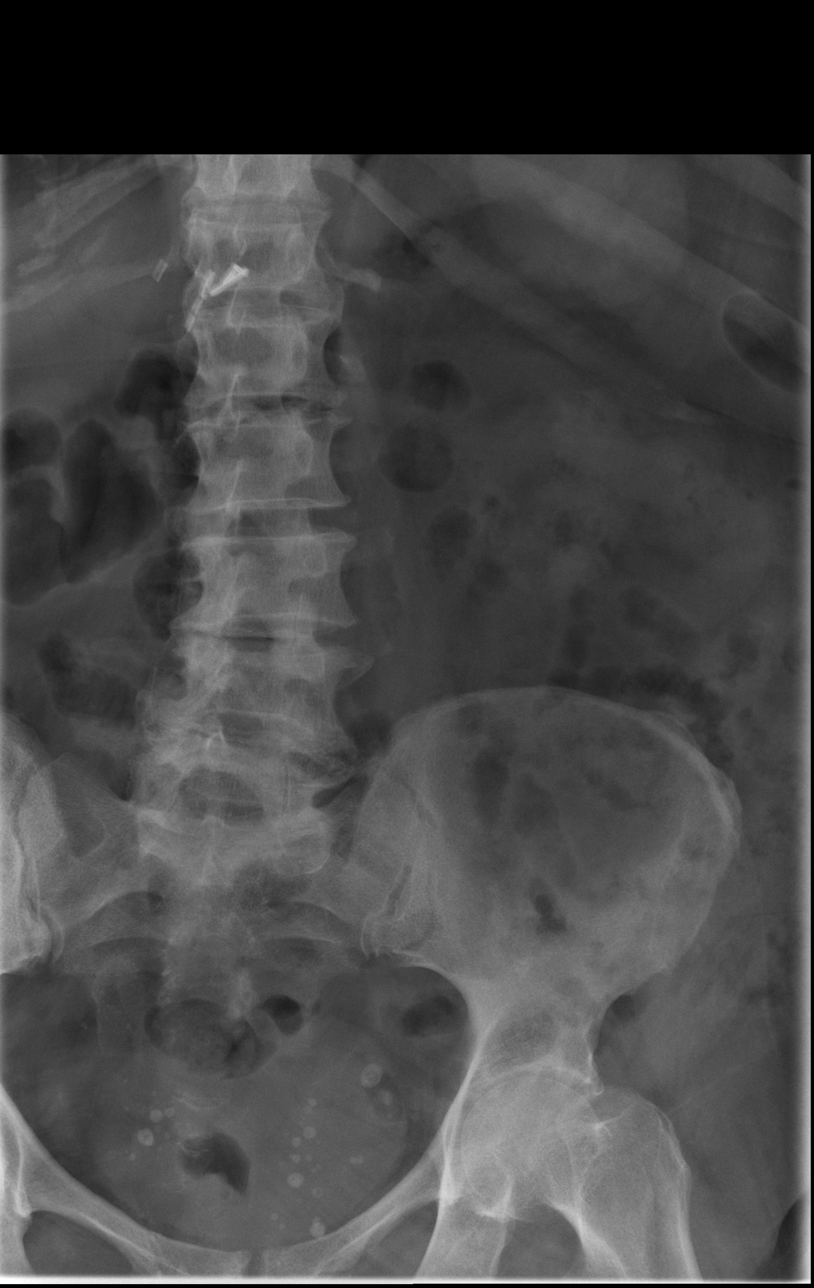
[im 2/4]
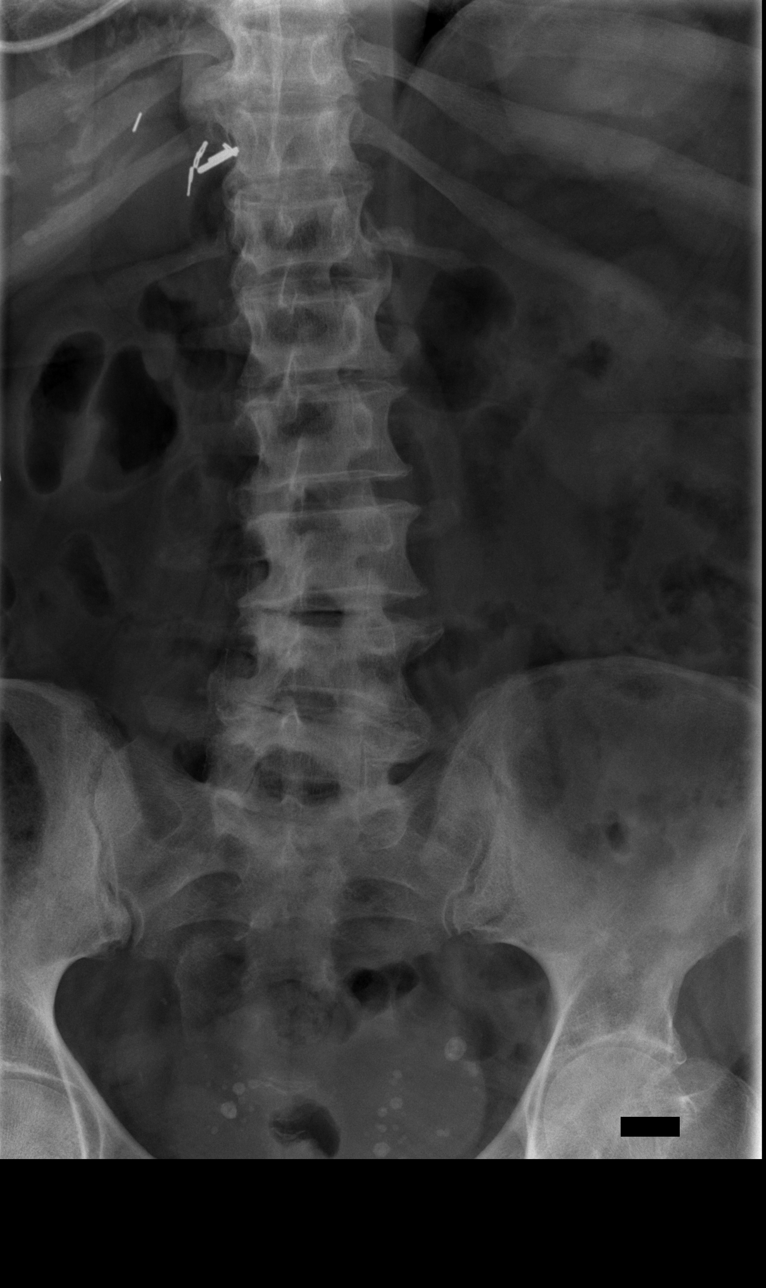
[im 3/4]
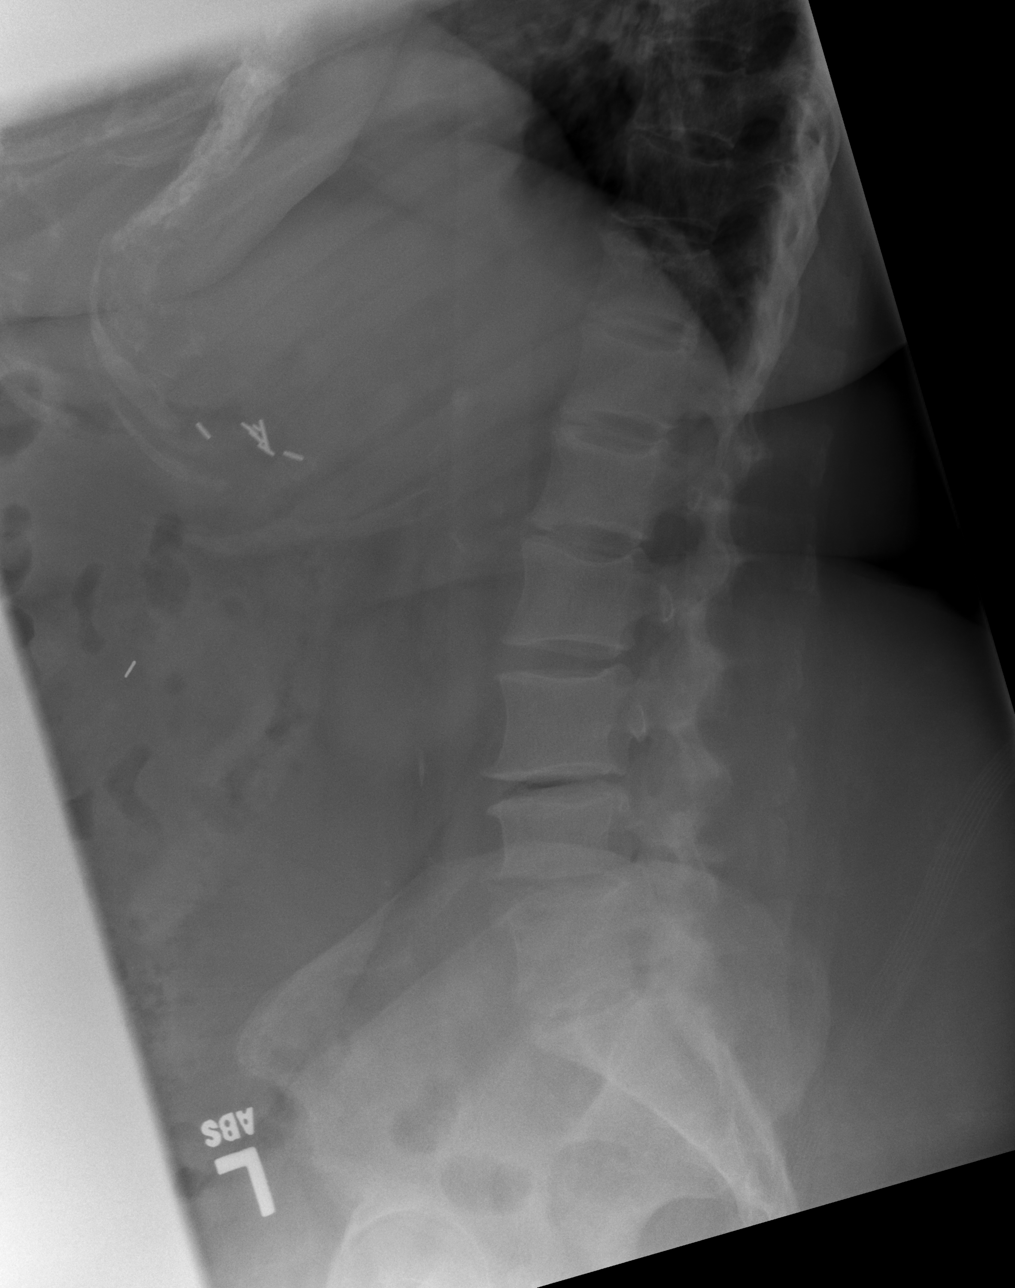
[im 4/4]
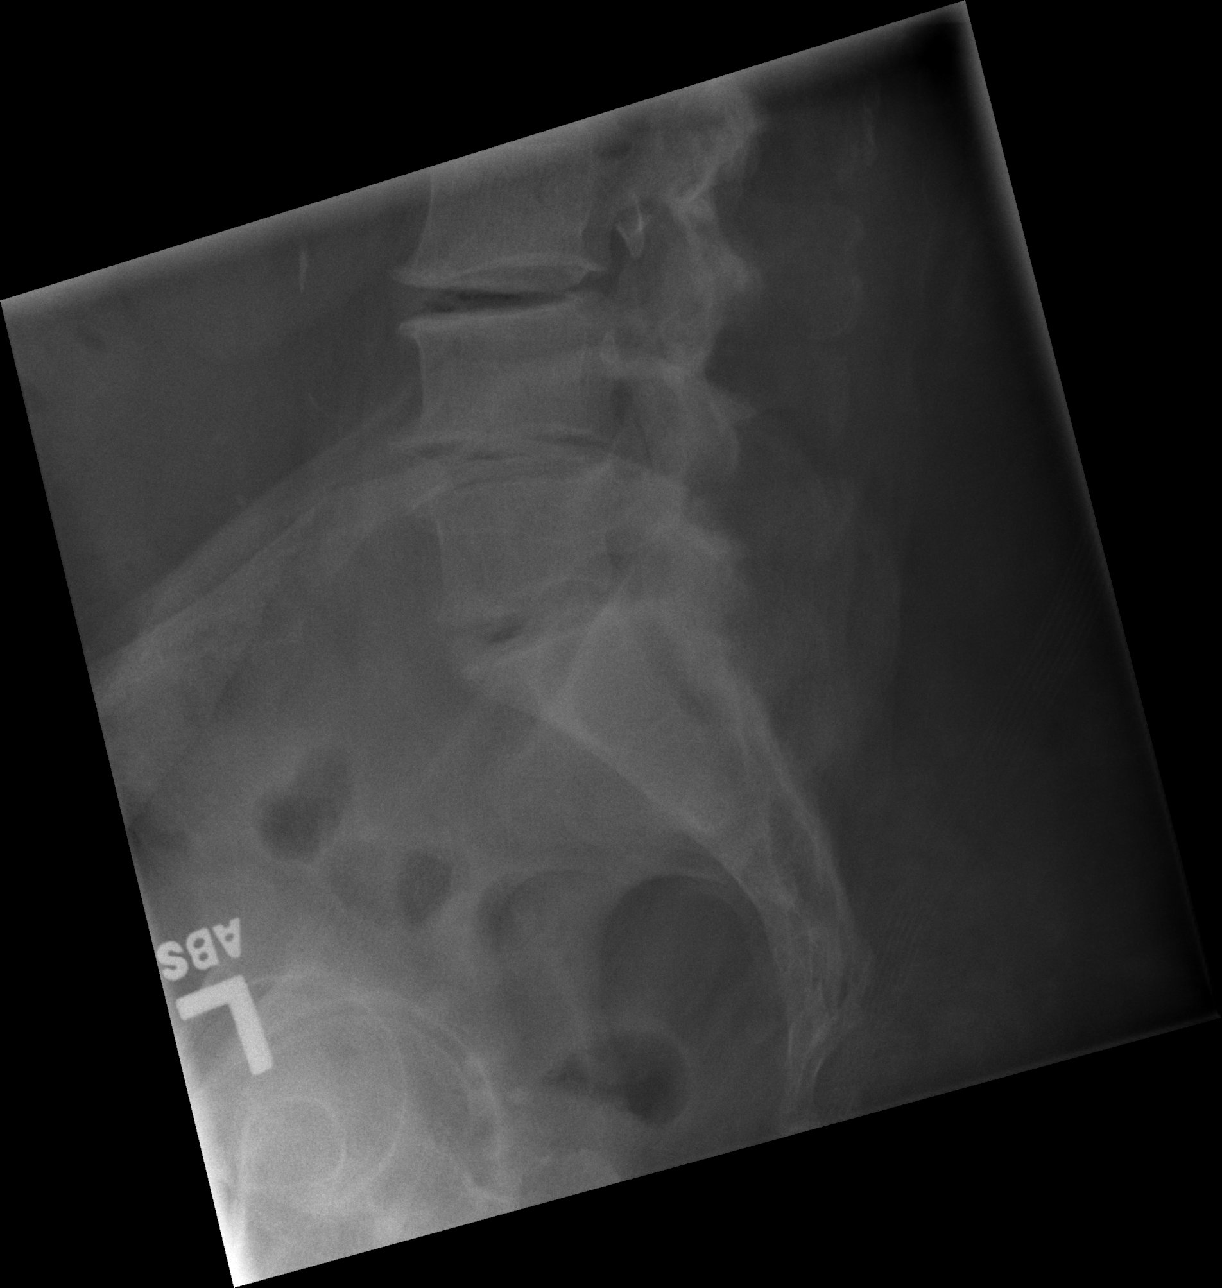

[4 of 4 positions shown; findings below may reference images not displayed]

FINDINGS: There is no evidence of acute fracture, dislocation. Mild
levoscoliosis is appreciated within the lumbar spine. Degenerative
disc disease changes identified within the lower lumbar spine most
severe at the L4-5 and L5-S1 levels.
IMPRESSION: No evidence of acute osseous abnormalities. Degenerative disc
disease changes within the lower lumbar spine.

## 2014-04-19 ENCOUNTER — Ambulatory Visit: Payer: Self-pay | Admitting: Pain Medicine

## 2014-07-03 ENCOUNTER — Inpatient Hospital Stay: Payer: Self-pay | Admitting: Internal Medicine

## 2014-07-03 LAB — CBC
HCT: 27.7 % — ABNORMAL LOW (ref 35.0–47.0)
HGB: 8.3 g/dL — AB (ref 12.0–16.0)
MCH: 27.4 pg (ref 26.0–34.0)
MCHC: 30 g/dL — ABNORMAL LOW (ref 32.0–36.0)
MCV: 92 fL (ref 80–100)
Platelet: 411 10*3/uL (ref 150–440)
RBC: 3.03 10*6/uL — ABNORMAL LOW (ref 3.80–5.20)
RDW: 15.7 % — ABNORMAL HIGH (ref 11.5–14.5)
WBC: 13.7 10*3/uL — ABNORMAL HIGH (ref 3.6–11.0)

## 2014-07-03 LAB — URINALYSIS, COMPLETE
Bilirubin,UR: NEGATIVE
Blood: NEGATIVE
Glucose,UR: NEGATIVE mg/dL (ref 0–75)
Ketone: NEGATIVE
Nitrite: NEGATIVE
Ph: 6 (ref 4.5–8.0)
Protein: NEGATIVE
RBC,UR: 1 /HPF (ref 0–5)
Specific Gravity: 1.005 (ref 1.003–1.030)
Squamous Epithelial: <1
WBC UR: 4 /HPF (ref 0–5)

## 2014-07-03 LAB — BASIC METABOLIC PANEL
ANION GAP: 5 — AB (ref 7–16)
Anion Gap: 7 (ref 7–16)
BUN: 36 mg/dL — ABNORMAL HIGH (ref 7–18)
BUN: 35 mg/dL — AB (ref 7–18)
CALCIUM: 9.5 mg/dL (ref 8.5–10.1)
CHLORIDE: 100 mmol/L (ref 98–107)
Calcium, Total: 9.9 mg/dL (ref 8.5–10.1)
Chloride: 101 mmol/L (ref 98–107)
Co2: 27 mmol/L (ref 21–32)
Co2: 30 mmol/L (ref 21–32)
Creatinine: 1.76 mg/dL — ABNORMAL HIGH (ref 0.60–1.30)
Creatinine: 1.72 mg/dL — ABNORMAL HIGH (ref 0.60–1.30)
EGFR (African American): 33 — ABNORMAL LOW
GFR CALC AF AMER: 32 — AB
GFR CALC NON AF AMER: 27 — AB
GFR CALC NON AF AMER: 28 — AB
Glucose: 90 mg/dL (ref 65–99)
Glucose: 94 mg/dL (ref 65–99)
OSMOLALITY: 276 (ref 275–301)
Osmolality: 280 (ref 275–301)
Potassium: 5.6 mmol/L — ABNORMAL HIGH (ref 3.5–5.1)
Potassium: 4.5 mmol/L (ref 3.5–5.1)
SODIUM: 136 mmol/L (ref 136–145)
Sodium: 134 mmol/L — ABNORMAL LOW (ref 136–145)

## 2014-07-03 LAB — CK TOTAL AND CKMB (NOT AT ARMC)
CK, TOTAL: 38 U/L
CK, Total: 58 U/L
CK-MB: 1.2 ng/mL (ref 0.5–3.6)
CK-MB: 1.3 ng/mL (ref 0.5–3.6)

## 2014-07-03 LAB — TSH: Thyroid Stimulating Horm: 0.979 u[IU]/mL

## 2014-07-03 LAB — CK-MB: CK-MB: 1 ng/mL (ref 0.5–3.6)

## 2014-07-03 LAB — TROPONIN I
Troponin-I: <0.02 ng/mL
Troponin-I: <0.02 ng/mL

## 2014-07-04 LAB — CBC WITH DIFFERENTIAL/PLATELET
BASOS ABS: 0.1 10*3/uL (ref 0.0–0.1)
Basophil %: 0.5 %
EOS ABS: 0.6 10*3/uL (ref 0.0–0.7)
EOS PCT: 6 %
HCT: 24.7 % — ABNORMAL LOW (ref 35.0–47.0)
HGB: 7.6 g/dL — ABNORMAL LOW (ref 12.0–16.0)
Lymphocyte #: 2.7 10*3/uL (ref 1.0–3.6)
Lymphocyte %: 25 %
MCH: 27.9 pg (ref 26.0–34.0)
MCHC: 30.7 g/dL — ABNORMAL LOW (ref 32.0–36.0)
MCV: 91 fL (ref 80–100)
Monocyte #: 1 x10 3/mm — ABNORMAL HIGH (ref 0.2–0.9)
Monocyte %: 9.1 %
Neutrophil #: 6.3 10*3/uL (ref 1.4–6.5)
Neutrophil %: 59.4 %
Platelet: 334 10*3/uL (ref 150–440)
RBC: 2.71 10*6/uL — ABNORMAL LOW (ref 3.80–5.20)
RDW: 15.5 % — AB (ref 11.5–14.5)
WBC: 10.7 10*3/uL (ref 3.6–11.0)

## 2014-07-04 LAB — COMPREHENSIVE METABOLIC PANEL
ALBUMIN: 2.8 g/dL — AB (ref 3.4–5.0)
ALK PHOS: 84 U/L
ALT: 11 U/L — AB
AST: 14 U/L — AB (ref 15–37)
Anion Gap: 7 (ref 7–16)
BILIRUBIN TOTAL: 0.3 mg/dL (ref 0.2–1.0)
BUN: 29 mg/dL — AB (ref 7–18)
CALCIUM: 9.4 mg/dL (ref 8.5–10.1)
CHLORIDE: 99 mmol/L (ref 98–107)
CREATININE: 1.91 mg/dL — AB (ref 0.60–1.30)
Co2: 30 mmol/L (ref 21–32)
EGFR (African American): 29 — ABNORMAL LOW
EGFR (Non-African Amer.): 25 — ABNORMAL LOW
GLUCOSE: 84 mg/dL (ref 65–99)
Osmolality: 277 (ref 275–301)
Potassium: 4.1 mmol/L (ref 3.5–5.1)
Sodium: 136 mmol/L (ref 136–145)
TOTAL PROTEIN: 6.8 g/dL (ref 6.4–8.2)

## 2014-07-05 LAB — CBC WITH DIFFERENTIAL/PLATELET
BASOS ABS: 0.1 10*3/uL (ref 0.0–0.1)
Basophil %: 1.1 %
EOS PCT: 7.5 %
Eosinophil #: 0.9 10*3/uL — ABNORMAL HIGH (ref 0.0–0.7)
HCT: 24.6 % — ABNORMAL LOW (ref 35.0–47.0)
HGB: 7.4 g/dL — AB (ref 12.0–16.0)
LYMPHS ABS: 3.5 10*3/uL (ref 1.0–3.6)
Lymphocyte %: 29.5 %
MCH: 27.6 pg (ref 26.0–34.0)
MCHC: 30.2 g/dL — AB (ref 32.0–36.0)
MCV: 91 fL (ref 80–100)
MONOS PCT: 9.2 %
Monocyte #: 1.1 x10 3/mm — ABNORMAL HIGH (ref 0.2–0.9)
NEUTROS ABS: 6.2 10*3/uL (ref 1.4–6.5)
Neutrophil %: 52.7 %
PLATELETS: 336 10*3/uL (ref 150–440)
RBC: 2.69 10*6/uL — AB (ref 3.80–5.20)
RDW: 15.7 % — ABNORMAL HIGH (ref 11.5–14.5)
WBC: 11.8 10*3/uL — AB (ref 3.6–11.0)

## 2014-07-05 LAB — BASIC METABOLIC PANEL
Anion Gap: 7 (ref 7–16)
BUN: 33 mg/dL — ABNORMAL HIGH (ref 7–18)
CALCIUM: 9.1 mg/dL (ref 8.5–10.1)
CREATININE: 1.69 mg/dL — AB (ref 0.60–1.30)
Chloride: 98 mmol/L (ref 98–107)
Co2: 31 mmol/L (ref 21–32)
GFR CALC AF AMER: 33 — AB
GFR CALC NON AF AMER: 29 — AB
Glucose: 96 mg/dL (ref 65–99)
Osmolality: 279 (ref 275–301)
POTASSIUM: 4.1 mmol/L (ref 3.5–5.1)
SODIUM: 136 mmol/L (ref 136–145)

## 2014-07-05 LAB — URINE CULTURE

## 2014-07-06 LAB — CBC WITH DIFFERENTIAL/PLATELET
BASOS ABS: 0 10*3/uL (ref 0.0–0.1)
BASOS PCT: 0.1 %
Eosinophil #: 1 10*3/uL — ABNORMAL HIGH (ref 0.0–0.7)
Eosinophil %: 8.3 %
HCT: 26.2 % — AB (ref 35.0–47.0)
HGB: 7.9 g/dL — AB (ref 12.0–16.0)
Lymphocyte #: 3.6 10*3/uL (ref 1.0–3.6)
Lymphocyte %: 30.2 %
MCH: 27.9 pg (ref 26.0–34.0)
MCHC: 30.3 g/dL — AB (ref 32.0–36.0)
MCV: 92 fL (ref 80–100)
MONO ABS: 0.9 x10 3/mm (ref 0.2–0.9)
Monocyte %: 7.9 %
Neutrophil #: 6.4 10*3/uL (ref 1.4–6.5)
Neutrophil %: 53.5 %
Platelet: 346 10*3/uL (ref 150–440)
RBC: 2.84 10*6/uL — AB (ref 3.80–5.20)
RDW: 15.6 % — ABNORMAL HIGH (ref 11.5–14.5)
WBC: 12 10*3/uL — AB (ref 3.6–11.0)

## 2014-07-06 LAB — BASIC METABOLIC PANEL
ANION GAP: 7 (ref 7–16)
BUN: 30 mg/dL — AB (ref 7–18)
CALCIUM: 9.3 mg/dL (ref 8.5–10.1)
Chloride: 97 mmol/L — ABNORMAL LOW (ref 98–107)
Co2: 31 mmol/L (ref 21–32)
Creatinine: 1.34 mg/dL — ABNORMAL HIGH (ref 0.60–1.30)
GFR CALC AF AMER: 44 — AB
GFR CALC NON AF AMER: 38 — AB
Glucose: 99 mg/dL (ref 65–99)
Osmolality: 276 (ref 275–301)
Potassium: 4.4 mmol/L (ref 3.5–5.1)
SODIUM: 135 mmol/L — AB (ref 136–145)

## 2014-07-26 ENCOUNTER — Ambulatory Visit: Payer: Self-pay | Admitting: Family

## 2014-08-30 ENCOUNTER — Observation Stay: Payer: Self-pay | Admitting: Internal Medicine

## 2014-08-30 LAB — URINALYSIS, COMPLETE
BILIRUBIN, UR: NEGATIVE
Glucose,UR: NEGATIVE mg/dL (ref 0–75)
Hyaline Cast: 3
KETONE: NEGATIVE
NITRITE: NEGATIVE
PH: 6 (ref 4.5–8.0)
PROTEIN: NEGATIVE
RBC,UR: 6 /HPF (ref 0–5)
SPECIFIC GRAVITY: 1.005 (ref 1.003–1.030)
WBC UR: 127 /HPF (ref 0–5)

## 2014-08-30 LAB — CK TOTAL AND CKMB (NOT AT ARMC)
CK, TOTAL: 31 U/L
CK, Total: 42 U/L
CK, Total: 40 U/L
CK-MB: 0.7 ng/mL (ref 0.5–3.6)
CK-MB: 0.6 ng/mL (ref 0.5–3.6)

## 2014-08-30 LAB — TROPONIN I
Troponin-I: <0.02 ng/mL
Troponin-I: <0.02 ng/mL

## 2014-08-30 LAB — CBC WITH DIFFERENTIAL/PLATELET
BASOS PCT: 0.7 %
Basophil #: 0.1 10*3/uL (ref 0.0–0.1)
Eosinophil #: 0.7 10*3/uL (ref 0.0–0.7)
Eosinophil %: 5.3 %
HCT: 30 % — ABNORMAL LOW (ref 35.0–47.0)
HGB: 8.9 g/dL — AB (ref 12.0–16.0)
LYMPHS ABS: 3.4 10*3/uL (ref 1.0–3.6)
Lymphocyte %: 26.5 %
MCH: 27 pg (ref 26.0–34.0)
MCHC: 29.8 g/dL — AB (ref 32.0–36.0)
MCV: 91 fL (ref 80–100)
MONO ABS: 0.7 x10 3/mm (ref 0.2–0.9)
MONOS PCT: 5.5 %
NEUTROS ABS: 8 10*3/uL — AB (ref 1.4–6.5)
Neutrophil %: 62 %
PLATELETS: 478 10*3/uL — AB (ref 150–440)
RBC: 3.3 10*6/uL — AB (ref 3.80–5.20)
RDW: 17 % — ABNORMAL HIGH (ref 11.5–14.5)
WBC: 13 10*3/uL — ABNORMAL HIGH (ref 3.6–11.0)

## 2014-08-30 LAB — BASIC METABOLIC PANEL
Anion Gap: 9 (ref 7–16)
BUN: 33 mg/dL — ABNORMAL HIGH (ref 7–18)
CHLORIDE: 99 mmol/L (ref 98–107)
CO2: 32 mmol/L (ref 21–32)
Calcium, Total: 9.5 mg/dL (ref 8.5–10.1)
Creatinine: 1.32 mg/dL — ABNORMAL HIGH (ref 0.60–1.30)
GFR CALC AF AMER: 50 — AB
GFR CALC NON AF AMER: 41 — AB
GLUCOSE: 104 mg/dL — AB (ref 65–99)
OSMOLALITY: 287 (ref 275–301)
POTASSIUM: 4.3 mmol/L (ref 3.5–5.1)
SODIUM: 140 mmol/L (ref 136–145)

## 2014-08-30 LAB — URIC ACID: Uric Acid: 4.9 mg/dL (ref 2.6–6.0)

## 2014-08-31 LAB — BASIC METABOLIC PANEL
Anion Gap: 7 (ref 7–16)
BUN: 36 mg/dL — AB (ref 7–18)
Calcium, Total: 9 mg/dL (ref 8.5–10.1)
Chloride: 101 mmol/L (ref 98–107)
Co2: 32 mmol/L (ref 21–32)
Creatinine: 1.31 mg/dL — ABNORMAL HIGH (ref 0.60–1.30)
GFR CALC AF AMER: 51 — AB
GFR CALC NON AF AMER: 42 — AB
Glucose: 85 mg/dL (ref 65–99)
Osmolality: 287 (ref 275–301)
Potassium: 4 mmol/L (ref 3.5–5.1)
Sodium: 140 mmol/L (ref 136–145)

## 2014-08-31 LAB — CBC WITH DIFFERENTIAL/PLATELET
Basophil #: 0.1 10*3/uL (ref 0.0–0.1)
Basophil %: 0.8 %
EOS ABS: 0.6 10*3/uL (ref 0.0–0.7)
Eosinophil %: 5.5 %
HCT: 25.6 % — ABNORMAL LOW (ref 35.0–47.0)
HGB: 8 g/dL — ABNORMAL LOW (ref 12.0–16.0)
LYMPHS ABS: 4 10*3/uL — AB (ref 1.0–3.6)
LYMPHS PCT: 36.5 %
MCH: 27.9 pg (ref 26.0–34.0)
MCHC: 31.1 g/dL — ABNORMAL LOW (ref 32.0–36.0)
MCV: 90 fL (ref 80–100)
MONOS PCT: 7.5 %
Monocyte #: 0.8 x10 3/mm (ref 0.2–0.9)
Neutrophil #: 5.5 10*3/uL (ref 1.4–6.5)
Neutrophil %: 49.7 %
Platelet: 406 10*3/uL (ref 150–440)
RBC: 2.85 10*6/uL — ABNORMAL LOW (ref 3.80–5.20)
RDW: 16.7 % — ABNORMAL HIGH (ref 11.5–14.5)
WBC: 11 10*3/uL (ref 3.6–11.0)

## 2014-08-31 LAB — IRON AND TIBC
Iron Bind.Cap.(Total): 318 ug/dL (ref 250–450)
Iron Saturation: 11 %
Iron: 35 ug/dL — ABNORMAL LOW (ref 50–170)
UNBOUND IRON-BIND. CAP.: 283 ug/dL

## 2014-08-31 LAB — FERRITIN: Ferritin (ARMC): 13 ng/mL (ref 8–388)

## 2014-09-01 LAB — CBC WITH DIFFERENTIAL/PLATELET
BASOS PCT: 0.6 %
Basophil #: 0.1 10*3/uL (ref 0.0–0.1)
Eosinophil #: 0.6 10*3/uL (ref 0.0–0.7)
Eosinophil %: 4.9 %
HCT: 26.1 % — AB (ref 35.0–47.0)
HGB: 8 g/dL — AB (ref 12.0–16.0)
LYMPHS ABS: 4 10*3/uL — AB (ref 1.0–3.6)
LYMPHS PCT: 35.8 %
MCH: 27.4 pg (ref 26.0–34.0)
MCHC: 30.6 g/dL — AB (ref 32.0–36.0)
MCV: 90 fL (ref 80–100)
MONOS PCT: 9.1 %
Monocyte #: 1 x10 3/mm — ABNORMAL HIGH (ref 0.2–0.9)
NEUTROS PCT: 49.6 %
Neutrophil #: 5.5 10*3/uL (ref 1.4–6.5)
Platelet: 433 10*3/uL (ref 150–440)
RBC: 2.91 10*6/uL — AB (ref 3.80–5.20)
RDW: 16.8 % — ABNORMAL HIGH (ref 11.5–14.5)
WBC: 11.2 10*3/uL — AB (ref 3.6–11.0)

## 2014-09-01 LAB — COMPREHENSIVE METABOLIC PANEL
ALT: 11 U/L — AB
ANION GAP: 8 (ref 7–16)
Albumin: 2.7 g/dL — ABNORMAL LOW (ref 3.4–5.0)
Alkaline Phosphatase: 100 U/L
BUN: 28 mg/dL — ABNORMAL HIGH (ref 7–18)
Bilirubin,Total: 0.3 mg/dL (ref 0.2–1.0)
CALCIUM: 9 mg/dL (ref 8.5–10.1)
Chloride: 103 mmol/L (ref 98–107)
Co2: 31 mmol/L (ref 21–32)
Creatinine: 1.09 mg/dL (ref 0.60–1.30)
EGFR (African American): >60
EGFR (Non-African Amer.): 52 — ABNORMAL LOW
GLUCOSE: 88 mg/dL (ref 65–99)
Osmolality: 288 (ref 275–301)
Potassium: 4 mmol/L (ref 3.5–5.1)
SGOT(AST): 14 U/L — ABNORMAL LOW (ref 15–37)
Sodium: 142 mmol/L (ref 136–145)
Total Protein: 6.6 g/dL (ref 6.4–8.2)

## 2014-09-02 LAB — BASIC METABOLIC PANEL
Anion Gap: 6 — ABNORMAL LOW (ref 7–16)
BUN: 26 mg/dL — ABNORMAL HIGH (ref 7–18)
CHLORIDE: 103 mmol/L (ref 98–107)
Calcium, Total: 9.1 mg/dL (ref 8.5–10.1)
Co2: 31 mmol/L (ref 21–32)
Creatinine: 0.99 mg/dL (ref 0.60–1.30)
EGFR (African American): >60
EGFR (Non-African Amer.): 58 — ABNORMAL LOW
Glucose: 86 mg/dL (ref 65–99)
Osmolality: 283 (ref 275–301)
POTASSIUM: 4.1 mmol/L (ref 3.5–5.1)
SODIUM: 140 mmol/L (ref 136–145)

## 2014-09-02 LAB — HEMATOCRIT: HCT: 26.7 % — AB (ref 35.0–47.0)

## 2014-09-03 LAB — CBC WITH DIFFERENTIAL/PLATELET
BASOS ABS: 0.1 10*3/uL (ref 0.0–0.1)
Basophil %: 1.1 %
EOS ABS: 0.6 10*3/uL (ref 0.0–0.7)
Eosinophil %: 6.2 %
HCT: 26.8 % — ABNORMAL LOW (ref 35.0–47.0)
HGB: 8.3 g/dL — AB (ref 12.0–16.0)
LYMPHS ABS: 3.5 10*3/uL (ref 1.0–3.6)
Lymphocyte %: 34.9 %
MCH: 28 pg (ref 26.0–34.0)
MCHC: 31 g/dL — ABNORMAL LOW (ref 32.0–36.0)
MCV: 91 fL (ref 80–100)
Monocyte #: 0.9 x10 3/mm (ref 0.2–0.9)
Monocyte %: 8.9 %
NEUTROS ABS: 4.9 10*3/uL (ref 1.4–6.5)
NEUTROS PCT: 48.9 %
Platelet: 407 10*3/uL (ref 150–440)
RBC: 2.96 10*6/uL — ABNORMAL LOW (ref 3.80–5.20)
RDW: 16.3 % — AB (ref 11.5–14.5)
WBC: 10.1 10*3/uL (ref 3.6–11.0)

## 2014-09-03 LAB — BASIC METABOLIC PANEL
ANION GAP: 4 — AB (ref 7–16)
BUN: 20 mg/dL — ABNORMAL HIGH (ref 7–18)
CALCIUM: 9.2 mg/dL (ref 8.5–10.1)
Chloride: 102 mmol/L (ref 98–107)
Co2: 31 mmol/L (ref 21–32)
Creatinine: 1.03 mg/dL (ref 0.60–1.30)
GLUCOSE: 93 mg/dL (ref 65–99)
OSMOLALITY: 276 (ref 275–301)
Potassium: 4 mmol/L (ref 3.5–5.1)
SODIUM: 137 mmol/L (ref 136–145)

## 2014-09-03 LAB — UR PROT ELECTROPHORESIS, URINE RANDOM

## 2014-09-03 LAB — PROTEIN ELECTROPHORESIS(ARMC)

## 2014-09-04 LAB — BASIC METABOLIC PANEL
Anion Gap: 4 — ABNORMAL LOW (ref 7–16)
BUN: 17 mg/dL (ref 7–18)
Calcium, Total: 9.2 mg/dL (ref 8.5–10.1)
Chloride: 102 mmol/L (ref 98–107)
Co2: 31 mmol/L (ref 21–32)
Creatinine: 0.98 mg/dL (ref 0.60–1.30)
EGFR (African American): >60
EGFR (Non-African Amer.): 58 — ABNORMAL LOW
GLUCOSE: 92 mg/dL (ref 65–99)
OSMOLALITY: 275 (ref 275–301)
Potassium: 4.1 mmol/L (ref 3.5–5.1)
Sodium: 137 mmol/L (ref 136–145)

## 2014-09-04 LAB — CBC WITH DIFFERENTIAL/PLATELET
BASOS PCT: 0.4 %
Basophil #: 0 10*3/uL (ref 0.0–0.1)
Eosinophil #: 0.6 10*3/uL (ref 0.0–0.7)
Eosinophil %: 5.8 %
HCT: 26.6 % — AB (ref 35.0–47.0)
HGB: 8.1 g/dL — AB (ref 12.0–16.0)
Lymphocyte #: 3.6 10*3/uL (ref 1.0–3.6)
Lymphocyte %: 33.2 %
MCH: 27.7 pg (ref 26.0–34.0)
MCHC: 30.6 g/dL — AB (ref 32.0–36.0)
MCV: 91 fL (ref 80–100)
MONOS PCT: 8.3 %
Monocyte #: 0.9 x10 3/mm (ref 0.2–0.9)
NEUTROS ABS: 5.6 10*3/uL (ref 1.4–6.5)
Neutrophil %: 52.3 %
Platelet: 388 10*3/uL (ref 150–440)
RBC: 2.93 10*6/uL — AB (ref 3.80–5.20)
RDW: 16.6 % — ABNORMAL HIGH (ref 11.5–14.5)
WBC: 10.8 10*3/uL (ref 3.6–11.0)

## 2014-09-07 LAB — URINE CULTURE

## 2015-01-22 IMAGING — CR DG CHEST 1V PORT
1 series · 1 of 1 positions shown · non-contrast
Comparison: 07/03/2014

CLINICAL DATA: Cough, shortness of breath, COPD

EXAM:
PORTABLE CHEST - 1 VIEW

[ap]
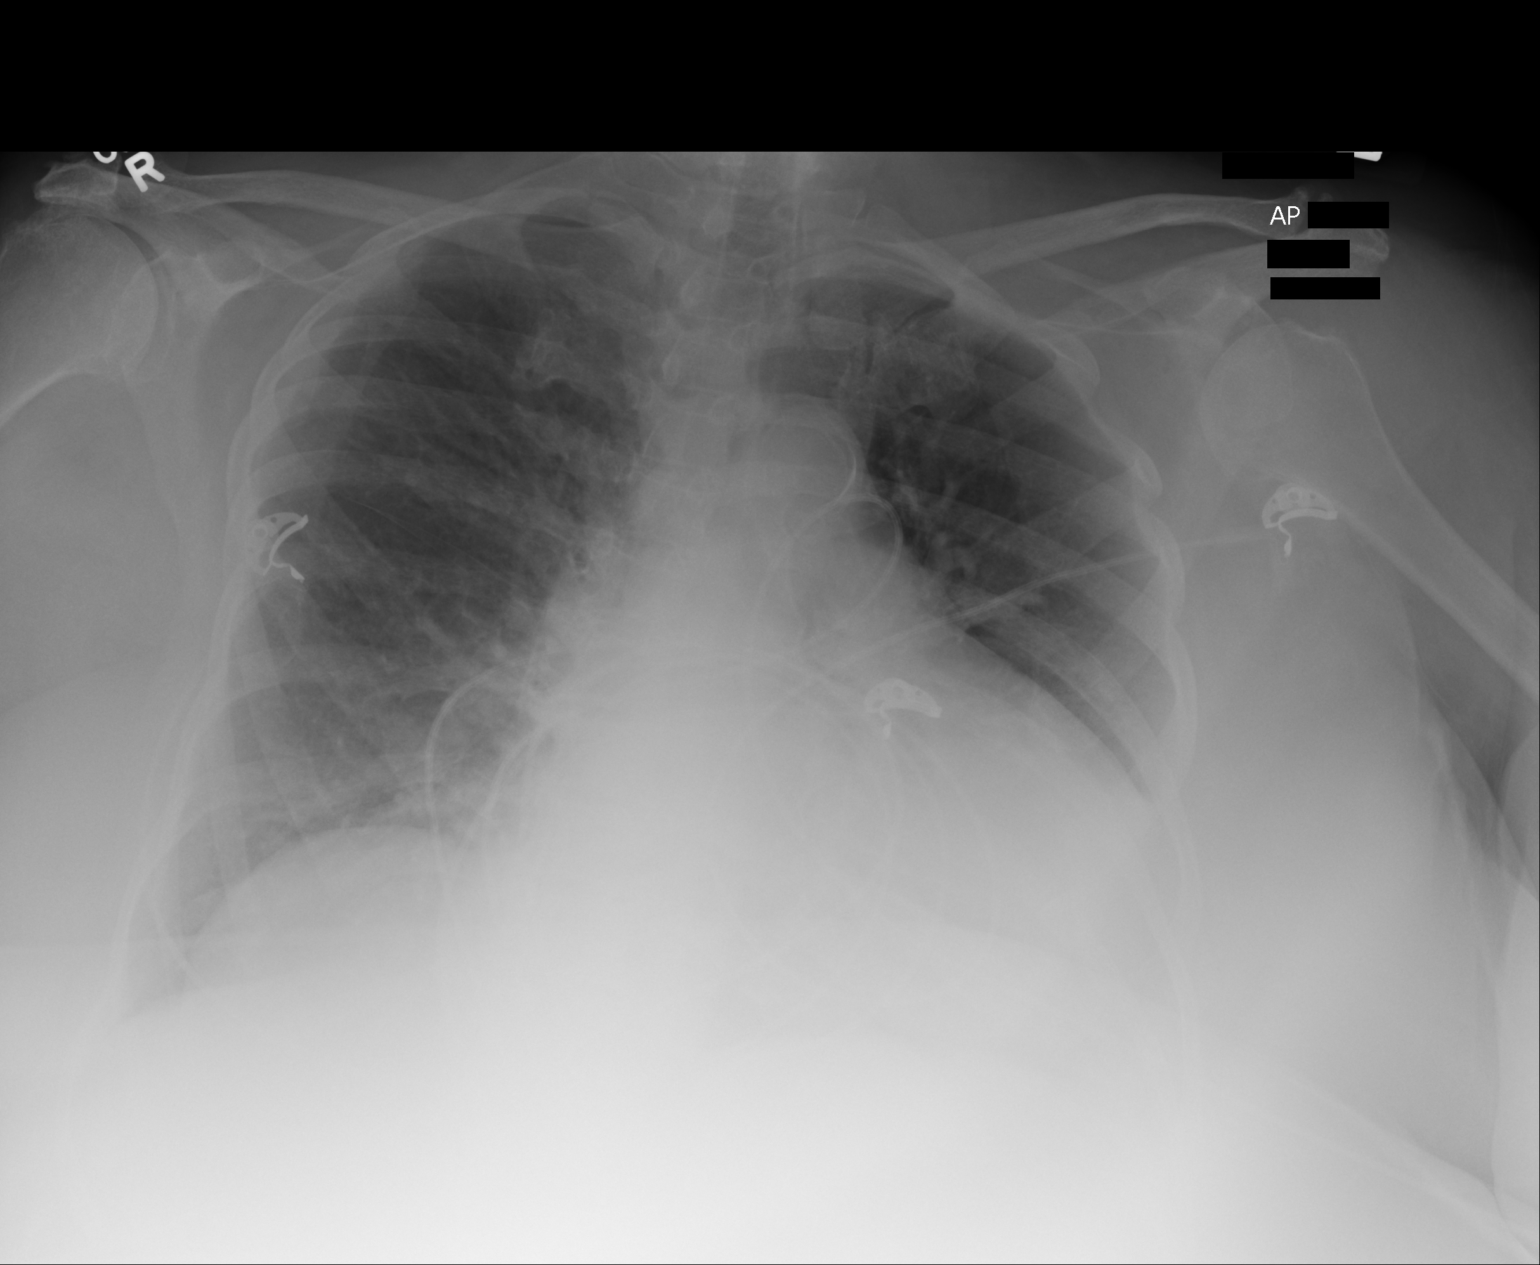

[1 of 1 positions shown; findings below may reference images not displayed]

FINDINGS: Cardiomegaly is noted. Study is limited by patient's large body
habitus. Mild interstitial prominence bilaterally without convincing
pulmonary edema. No segmental infiltrate. Mild left basilar
atelectasis.
IMPRESSION: Limited study by patient's large body habitus. Cardiomegaly. Mild
interstitial prominence bilaterally without convincing pulmonary
edema. No segmental infiltrate.

## 2015-02-22 NOTE — H&P (Signed)
PATIENT NAME:  Kathy Guzman, VANALSTINE MR#:  [id number] DATE OF BIRTH:  23-Feb-1935  DATE OF ADMISSION:  10/16/2013  PRIMARY CARE PHYSICIAN: Tracie Harrier, MD  CHIEF COMPLAINT: Shortness of breath.   HISTORY OF PRESENT ILLNESS: This is a 80 year old female with morbid obesity, COPD on 3 liters of oxygen with chronic respiratory failure, diastolic heart failure with a normal ejection fraction, who presents with the above complaint. The patient says that over the past 2 to 3 days, she has not been feeling well. She has suffered some chills, coughing some kind of frothy and brown sputum. She is having increasing shortness of breath and wheezing. She has received 3 nebs here in the ER. She is feeling slightly better after the antibiotics and nebs have been given. She was also feeling dizzy and lightheaded earlier today. She is basically bedbound. She does use a walker to use her bedside commode.   REVIEW OF SYSTEMS:  CONSTITUTIONAL: No fever. Positive chills. Positive fatigue. She does not know if she has had weight gain or weight loss. EYES: No blurred or double vision or glaucoma.  EARS, NOSE, THROAT: No ear pain, hearing loss, seasonal allergies. Positive snoring.  RESPIRATORY: Positive cough. Positive wheezing. No hemoptysis. Positive dyspnea. No painful respirations. Has a history of COPD with chronic respiratory failure on 3 liters of oxygen.  CARDIOVASCULAR: No chest pain, palpitations, or orthopnea. No lower extremity edema. No syncope, arrhythmia, dyspnea on exertion because she does not ambulate.  GASTROINTESTINAL: No nausea, vomiting, diarrhea, abdominal pain, melena, or ulcers. GENITOURINARY: No dysuria or hematuria.  ENDOCRINE: No polyuria or polydipsia.  HEMATOLOGIC AND LYMPHATIC: No easy bruising or bleeding.  SKIN: No rash or lesions.  MUSCULOSKELETAL: Limited activity due to her body habitus, and she is basically bedbound. NEUROLOGIC: No history of CVA, TIA. PSYCHIATRIC: Positive history  of anxiety. No depression.   PAST MEDICAL HISTORY:  1.  Diastolic heart failure with a normal ejection fraction of 55% by last echocardiogram.  2.  History of COPD with chronic respiratory failure on 3 liters of oxygen. 3.  Hypothyroidism.  4.  Gout.  5.  Arthritis.  6.  Hypertension.   MEDICATIONS:  1.  Valsartan 80 mg daily.  2.  Symbicort 2 puffs b.i.d.  3.  Paxil 37.5 mg daily.  4.  Omeprazole 20 mg daily.  5.  Synthroid 75 mcg daily.  6.  Lasix 40 mg daily.  7.  Hydroxyzine 1 tablet daily.  8.  Gabapentin 200 mg t.i.d.  9.  Felodipine 5 mg daily.  10.  Clonazepam 0.5 mg b.i.d. p.r.n.  11.  Allopurinol 200 mg daily  ALLERGIES: TRAMADOL.   SOCIAL HISTORY: No tobacco, alcohol, or drug use. She has home health care, and her daughter stays with her at night.   PAST SURGICAL HISTORY: None.   PHYSICAL EXAMINATION: VITAL SIGNS: Temperature 98. Pulse is 95, respirations 23, blood pressure 159/79, 99% on 3 liters.  GENERAL: The patient is alert, oriented. Does not appear to be in acute distress. She is lying in her bed.  HEENT: Head is atraumatic. Pupils are round and reactive. Sclerae anicteric. Mucous membranes are dry. Oropharynx is clear without any exudate.  NECK: The patient has a very small neck, hard to appreciate any lymphadenopathy or thyroid issues.  CARDIOVASCULAR: Distant heart sounds. No appreciable murmurs, gallops, or rubs. Cannot palpate PMI due to body habitus.  LUNGS: Anteriorly are clear to auscultation. I do not hear any audible wheezing, rales, or rhonchi. I did not percuss,  as the patient is bedbound and unable to turn.  ABDOMEN: Morbidly obese with hypoactive bowel sounds. Hard to appreciate organomegaly due to body habitus. There is no rebound, guarding, or ecchymosis. EXTREMITIES: No clubbing, cyanosis, or edema.  NEUROLOGIC: Cranial nerves II through XII are grossly intact. There are no focal deficits.  SKIN: Without rash or lesions.   LABORATORY AND  RADIOLOGICAL DATA: BNP is 270. CK 2; CK-MB is less than 0.5. White blood cells 14.6, hemoglobin 10.8, hematocrit 25, platelets 395. Sodium 136, potassium 4.1, chloride 99, bicarbonate 33, BUN 19, creatinine 1.24, glucose 102, calcium 9.7, bilirubin 0.3, alkaline phosphatase 118, ALT 13, AST 14, total protein 7.9, albumin 3.3. Troponin less than 0.02. Chest x-ray shows bilateral pneumonia.   ASSESSMENT AND PLAN: This is a 80 year old. morbidly obese woman with a history of chronic obstructive pulmonary disease with chronic respiratory failure on 3 liters of oxygen, who presents with acute on chronic respiratory failure secondary to pneumonia. 1.  Acute on chronic respiratory failure secondary to bilateral community-acquired pneumonia with probable mild chronic obstructive pulmonary disease exacerbation as outlined below.  2.  Community-acquired pneumonia. Chest x-ray shows bilateral community-acquired pneumonia. Blood cultures were ordered in the ER. We will start Rocephin and azithromycin, follow the patient closely. She is on 3 liters of oxygen at home, which she is on currently.  3.  Acute  chronic obstructive pulmonary disease exacerbation. The patient does not have any obvious wheezing; however, earlier according to the ER physician and per EMS, she did have wheezing, so we will start IV steroids, continue her outpatient inhalers and DuoNebs along with the antibiotics as mentioned above.  4.  Morbid obesity. We encouraged the patient to try to lose weight; however, she is bedbound and this may be difficult for her to do.  5.  Anemia. This is significant anemia from her last hemoglobin about a year ago. I have ordered some iron studies, will continue to monitor. I will also order stool guaiacs.  6.  The patient is a DO NOT RESUSCITATE status.    TIME SPENT: Approximately 40 minutes.   ____________________________ Donell Beers. Benjie Karvonen, MD spm:jcm D: 10/16/2013 15:18:34 ET T: 10/16/2013 15:47:14  ET JOB#: [id number]  cc: Tylena Prisk P. Benjie Karvonen, MD, <Dictator> Tracie Harrier, MD Donell Beers Leather Estis MD ELECTRONICALLY SIGNED 10/16/2013 17:06

## 2015-02-22 NOTE — Consult Note (Signed)
Brief Consult Note: Diagnosis: Anemia.   Patient was seen by consultant.   Consult note dictated.   Comments: Kathy Guzman is a pleasant 80 y/o caucasian female with chronic iron deficiency anemia (low serum iron /low normal ferritin) admitted with pneumonia.  Hgb was 9.6 in Sept 2012, now 7.8 upon admission with 1 gram drop since admission.  She is hemoccult negative today with no GI complaints or gross bleeding.  Colonoscopy last year by Dr Candace Cruise showed diverticulosis & a lipoma.  EGD in 2012 was normal by Dr Candace Cruise.  There is no acute indication for endoscopic evaluation at this time.  She should follow up with her gastroenterologist or [place] to complete her anemia work-up as an outpatient unless there is a change in her status this admission.  I have discussed her care with Dr Lucilla Lame.    Will sign off.  Thanks for consult.  Please see full dictated note. #[id number].  Electronic Signatures: Andria Meuse (NP)  (Signed 17-Dec-14 14:16)  Authored: Brief Consult Note   Last Updated: 17-Dec-14 14:16 by Andria Meuse (NP)

## 2015-02-22 NOTE — Consult Note (Signed)
EGD showed mild duodenitis with superficial erosions. Start protonix 40mg  daily po x 1 month. Will sign off. Thanks  Electronic Signatures: Verdie Shire (MD)  (Signed on 22-Dec-14 12:31)  Authored  Last Updated: 22-Dec-14 12:31 by Verdie Shire (MD)

## 2015-02-22 NOTE — Discharge Summary (Signed)
PATIENT NAME:  Kathy Guzman MR#:  [id number] DATE OF BIRTH:  02-17-1935  DATE OF ADMISSION:  10/16/2013 DATE OF DISCHARGE:  10/24/2013  DIAGNOSES AT TIME OF DISCHARGE: 1. Acute on chronic respiratory failure secondary to community-acquired pneumonia.  2. Chronic obstructive pulmonary disease exacerbation.  3. Morbid obesity.  4. Anemia.  5. Erythematous duodenopathy on an esophagogastroduodenoscopy.  6. Hypothyroidism.  7. Hypertension. 8. History of gout.  9. Anxiety.   CHIEF COMPLAINT: Shortness of breath.   HISTORY OF PRESENT ILLNESS: Kathy Guzman is a 80 year old female with history of morbid obesity, COPD, chronic respiratory failure, diastolic heart failure with normal ejection fraction presented to the ER complaining of increasing shortness of breath for about 2 to 3 days associated with chills, cough, productive of frothy brown sputum. The patient received a nebulizer treatment in the ER which made her feel better, but continued to be symptomatic with dizziness and lightheadedness.   PAST MEDICAL HISTORY: Significant for diastolic heart failure, history of COPD, hypothyroidism, gout, arthritis, hypertension.   PHYSICAL EXAMINATION: VITAL SIGNS: Temperature was 98, pulse was 95, respirations 20, blood pressure 159/79, oxygen saturation 99% on 3 liters nasal cannula.  GENERAL: She is alert and oriented and not in distress.  HEENT: [place]/AT NECK: Supple, short.  HEART: S1, S2.  LUNGS: :Rhonchi present bilaterally.   ABDOMEN: Soft, nontender, obese.  EXTREMITIES: 1+ edema.  NEUROLOGIC: Nonfocal.   She also had pressure sore in the sacral area.   LABORATORY, DIAGNOSTIC AND RADIOLOGICAL DATA: WBC count 14.6, hemoglobin 10.8, hematocrit 25, platelets 395, sodium 136, potassium 4.1, chloride 99, bicarbonate 33, BUN 19, creatinine 1.24, glucose 102. His calcium 9.7, alkaline phosphatase 118. ALT 13, AST 14.   HOSPITAL COURSE: The patient was admitted to [facility] and received nebulized bronchodilator therapy as well as IV antibiotics. She is also seen in consultation by GI and underwent an EGD which showed normal-appearing stomach, and normal esophagus but evidence of erythematous duodenopathy. She did receive 1 unit of blood transfusion for low hemoglobin and she is also seen by physical therapy. She did have a significant leukocytosis and her prednisone was gradually reduced and her WBC count started to come down. She was stable at the time of discharge and was discharged on the following medications.   DISCHARGE MEDICATIONS: Omeprazole 20 mg b.i.d. Lasix 40 mg once a day, gabapentin 100 mg 2 capsules t.i.d., levothyroxine 75 mcg a day, prednisone taper 20 mg a day for three days, decrease to 10 mg a day for three days and stop. Losartan 80 mg once a day, felodipine 5 mg once a day, allopurinol 100 mg 2 times a day, paroxetine 37.5 mg 1 tablet,  cefuroxime 500 mg p.o. b.i.d. for five days, nystatin topical powder t.i.d. Oxygen at 2 liters nasal cannula.   FOLLOWUP: The patient was advised a low sodium diet and follow up with me, Dr. Ginette Pitman in 1 to 2 weeks' time. The patient has been advised to call back if there are any questions or concerns.   TOTAL TIME SPENT ON IN DISCHARGE OF THIS PATIENT: 40 minutes.   ____________________________ Tracie Harrier, MD vh:sg D: 10/24/2013 12:57:27 ET T: 10/24/2013 13:13:34 ET JOB#: [id number]  cc: Tracie Harrier, MD, <Dictator> Tracie Harrier MD ELECTRONICALLY SIGNED 10/24/2013 17:22

## 2015-02-23 NOTE — Consult Note (Signed)
Brief Consult Note: Diagnosis: Subluxation left shoulder.   Patient was seen by consultant.   Recommend to proceed with surgery or procedure.   Recommend further assessment or treatment.   Orders entered.   Comments: 80 year old morbidly obese female injured the left shoulder several days ago at home.  Admitted for medical problems.  X-rays last night showed possible subluxation of left shoulder.  Still painful today.   Exam:  Pleasant female lying in bed.  Left shoulder painful to movement.  Too obese to palpate landmarks.  With adduction applied across chest the humeral head was felt to reduce into the joint.  range of motion better following this.  Imp:  Subluxation left shoulder  Rx:  Sling        Repeat X-rays.  Electronic Signatures: Park Breed (MD)  (Signed 30-Oct-15 15:28)  Authored: Brief Consult Note   Last Updated: 30-Oct-15 15:28 by Park Breed (MD)

## 2015-02-23 NOTE — Consult Note (Signed)
I have ordered left shoulder X-rays and will see patient in the AM.    Electronic Signatures: Park Breed (MD)  (Signed on 29-Oct-15 18:13)  Authored  Last Updated: 29-Oct-15 18:13 by Park Breed (MD)

## 2015-02-23 NOTE — Consult Note (Signed)
PATIENT NAME:  Kathy Guzman, Kathy Guzman MR#:  [id number] DATE OF BIRTH:  1935-06-06  DATE OF CONSULTATION:  07/04/2014  REQUESTING PHYSICIAN: Dion Body, MD   CONSULTING PHYSICIAN:  Otelia Limes. Yves Dill, MD  REASON FOR CONSULTATION: Urinary retention.   HISTORY OF PRESENT ILLNESS: Mrs. Osmer is a 80 year old Caucasian female admitted to the hospital with respiratory failure. She was also noted to have urinary  retention and catheter was placed. She has a relatively long history of difficulty with voiding. She states that she had a thorough evaluation at [facility], including urodynamics, and was told that there was nothing further that could be done to improve her bladder function. The patient also reports some degree of incontinence. She is essentially bedridden due to severe obesity and COPD. She states that she cannot perform self-catheterization.   ALLERGIES: TRAMADOL, IRON SULFATE AND NON-STEROIDAL ANTI-INFLAMMATORY DRUGS.    CHRONIC MEDICATIONS: Included Tylenol, allopurinol, Celebrex, gabapentin, hydroxyzine, Lasix, levothyroxine, omeprazole, paroxetine, Symbicort, losartan, vitamin B12, vitamin D.   PAST SURGICAL HISTORY:  Include:   1.  Possible urethral dilatation and some type of bladder surgery elsewhere.  2.  Colonoscopy.  3.  Skin cancer removal.   PAST AND CURRENT MEDICAL CONDITIONS:   1.  COPD.   2.  Gout.  3.  Congestive heart failure.  4.  Lumbar degenerative spine disease.  5.  Osteoarthritis.  6.  Hypothyroidism.  7.  Diverticulosis. 8.  Morbid obesity.   REVIEW OF SYSTEMS: The patient has no history of kidney stones or hematuria.   PHYSICAL EXAMINATION: Deferred.   PERTINENT LABORATORY STUDIES: Include a BUN of 29 and a creatinine of 1.91.   IMPRESSION: 1.  Urinary retention.  2.  Urinary incontinence.  3.  Morbid obesity.  4.  Congestive heart failure.   SUGGESTIONS: 1.  The patient was informed (after a thorough evaluation at [facility]) that there were no surgical or medical options for her bladder issues.  2.  Suggest continuing with Foley catheter and have home health nursing exchange the catheter monthly.  The patient is satisfied with this option. She states that she cannot perform self-catheterization.    ____________________________ Otelia Limes. Yves Dill, MD mrw:lr D: 07/04/2014 18:56:55 ET T: 07/04/2014 19:37:07 ET JOB#: [id number]  cc: Otelia Limes. Yves Dill, MD, <Dictator> Royston Cowper MD ELECTRONICALLY SIGNED 07/04/2014 21:25

## 2015-02-23 NOTE — Discharge Summary (Signed)
PATIENT NAME:  Kathy Guzman, Kathy Guzman MR#:  [id number] DATE OF BIRTH:  14-May-1935  DATE OF ADMISSION:  08/30/2014 DATE OF DISCHARGE:  09/04/2014  DIAGNOSES AT TIME OF DISCHARGE: 1.  Left shoulder dislocation with avulsion fracture of the anterior/inferior aspect of the rim of the glenoid and small avulsion of the greater tuberosity of the proximal humerus, possible fracture of the base of the coracoid process.  2.  Morbid obesity.  3.  Generalized weakness.  4.  History of bladder incontinence.  5.  History of hypertension.  6.  History of depression.  7.  History of thyroid disease.   CHIEF COMPLAINT:  Weakness and pain in the left upper extremity, difficulty moving left arm, and also shortness of breath and chest pain.   HISTORY OF PRESENT ILLNESS:  Kathy Guzman is a 80 year old female who presented to the [facility] accompanied by family complaining of generalized weakness. The patient reportedly had tried to get out of bed the previous day and had fallen and sustained a bruise on the right upper extremity and also left leg. In addition, she had been complaining of pain in the left upper arm and had been taking hydrocodone without much relief of symptoms.   PAST MEDICAL HISTORY:  Significant for morbid obesity, bladder incontinence, congestive heart failure, arthritis, anemia, anxiety, depression, COPD, obesity, hypoventilation syndrome, hypertension, and thyroid disease. Please see H and P for full details.   HOSPITAL COURSE:  The patient was seen in consultation by orthopedics and also underwent a CT of the left shoulder without contrast, which showed evidence of an anterior/inferior dislocation of the left humeral head, extensor avulsion fracture of the anterior/inferior aspect of the rim of the glenoid, small avulsion fracture of the greater tuberosity of the proximal humerus, and possible fracture of the base of the coracoid. She was seen in consultation by Dr. Sabra Heck, orthopedist, who felt  that the patient was too high risk for major shoulder surgery. She was seen by physical therapy and felt that it would be best for her to be managed in skilled nursing for a few weeks before being reassessed by orthopedics.   LABORATORY DATA:  On admission, hemoglobin was 8 grams, hematocrit 26.1, Glucose was 88, BUN 28, creatinine 1.09, and EGFR was greater than 60.   During her stay in the hospital, the patient did have low blood pressure, and her felodipine was therefore discontinued. She also had evidence for an enterococcus UTI, for which she received antibiotics, and was discharged in stable condition on the following medications.   DISCHARGE MEDICATIONS:  Omeprazole 20 mg p.o. b.i.d., Lasix 40 mg once a day, Paxil 37.5 mg once a day, allopurinol 100 mg b.i.d., Symbicort 160/4.5 two puffs b.i.d., valsartan 80 mg once a day, levothyroxine 75 mcg once a day, nystatin powder one application b.i.d., clonazepam 0.5 mg p.o. b.i.d., albuterol inhaler 2 puffs q.i.d. p.r.n., gabapentin 300 mg p.o. t.i.d., hydrocodone/acetaminophen 5/325 one tablet q. 8 hours p.r.n., amoxicillin 875 mg p.o. b.i.d. for 7 days.   DISPOSITION:  The patient has been advised to follow up with Dr. Sabra Heck of orthopedics and also follow up with me, Dr. Ginette Pitman, in 2 to 4 weeks' time. The patient was stable at time of transfer. She was transferred to skilled nursing facility, and family was kept informed of her overall condition.    Total time spent in discharge of patient: 35 minutes ____________________________ Tracie Harrier, MD vh:nb D: 09/04/2014 12:34:22 ET T: 09/04/2014 21:57:03 ET JOB#: [id number]  cc: Cherlyn Labella  Ginette Pitman, MD, <Dictator> Tracie Harrier MD ELECTRONICALLY SIGNED 10/02/2014 17:44

## 2015-02-23 NOTE — H&P (Signed)
PATIENT NAME:  Kathy Guzman, RAFTERY MR#:  [id number] DATE OF BIRTH:  1934/12/25  DATE OF ADMISSION:  08/30/2014  CHIEF COMPLAINT:  1.  Weakness. 2.  Pain in the left upper extremity with difficulty in moving left arm.  3.  Chest pain.  4.  Shortness of breath.   HISTORY OF PRESENT ILLNESS: Kathy Guzman is a 80 year old female who presented to the clinic accompanied by family complaining of generalized weakness. The patient states that she was trying to get out of bed and sustained a bruise on the right upper extremity and also left leg. She also has been complaining of pain in the left arm and has been taking hydrocodone without much relief. The patient also states that she is short of breath and has been complaining of chest pain. The patient has lost her in home care help and family is unable to take care of her and at this point they are actively considering placing her in a nursing home. The patient denies any fevers or chills.   PAST MEDICAL HISTORY: Significant for morbid obesity, bladder incontinence, congestive heart failure, arthritis, anemia, anxiety, depression, COPD, obesity hypoventilation syndrome, hypertension, and thyroid disease.   PAST SURGICAL HISTORY: Significant for cholecystectomy, dilatation of urethral stricture, bladder tack surgery, left radius fracture surgery, skin cancer surgery.  FAMILY HISTORY: Positive for colon cancer in her brother. Mother had a stroke and heart attack, father had MI and son had cancer.   ALLERGIES: TRAMADOL, FERROUS SULFATE AND NONSTEROIDAL ANTI-INFLAMMATORY AGENTS.   CURRENT MEDICATIONS: Valsartan 80 mg once a day, Paxil 37.5 mg a day, oxybutynin 5 mg daily, omeprazole 20 mg twice a day, levothyroxine 75 mcg once a day, hydrocodone 1 tablet p.o. b.i.d. p.r.n., furosemide 40 mg once a day, felodipine 5 mg once a day, diclofenac gel p.r.n., clonazepam 0.5 mg b.i.d., Celebrex 200 mg a day, Symbicort 160/4.5 one puff b.i.d., allopurinol 100 mg once a  day, albuterol MDI 2 puffs q.i.d. p.r.n. and Tylenol as needed.   PHYSICAL EXAMINATION:  GENERAL: The patient was in obvious pain, depressed at times. She is morbidly obese. VITAL SIGNS: Blood pressure 122/78, pulse 78.  HEENT: Conjunctivae clear.  HEART: S1, S2.  LUNGS: Bibasilar crackles heard.  ABDOMEN: Soft, obese, nontender.  EXTREMITIES: 2+ edema.  NEUROLOGIC: Alert and oriented x3. No obvious focal deficits. Cranial nerves were intact. MUSCULOSKELETAL: The patient had tenderness in the left upper arm with decreased range of motion secondary to pain and also tenderness in the anterior and posterior aspect of the left shoulder.   IMPRESSION: 1.  Recent fall with generalized weakness and pain in the left upper extremity.  2.  Chest pain.  3.  Hypertension.  4.  Chronic respiratory failure with obesity hypoventilation syndrome and chronic obstructive pulmonary disease, on chronic oxygen. 5.  History of hypothyroidism. 6.  Gastroesophageal reflux disease. 7.  Depression and anxiety.   PLAN: I advised the patient to be admitted to [facility]. Family at this point is not able to take care of her and would prefer that she is placed in a nursing home, and we will obtain orthopedic consult regarding left shoulder pain and consider getting a MRI if necessary. We will continue home medications. Will also rule her out for myocardial infarction by doing cardiac enzymes and continue pain management and IV Lasix. Family was in agreement with plan.   TOTAL TIME SPENT IN ADMITTING THIS PATIENT: 40 minutes  ____________________________ Tracie Harrier, MD vh:sb D: 08/30/2014 11:44:00 ET T: 08/30/2014 12:00:17 ET  JOB#: [id number]  cc: Tracie Harrier, MD, <Dictator> Tracie Harrier MD ELECTRONICALLY SIGNED 10/02/2014 17:43

## 2015-02-23 NOTE — Consult Note (Signed)
PATIENT NAME:  Kathy Guzman, Kathy Guzman MR#:  [id number] DATE OF BIRTH:  April 12, 1935  DATE OF CONSULTATION:  07/03/2014  REFERRING PHYSICIAN:   Theodoro Grist, MD   CONSULTING PHYSICIAN:  Isaias Cowman, MD  CHIEF COMPLAINT: Shortness of breath and urinary retention.   HISTORY OF PRESENT ILLNESS: The patient is a 80 year old female with history of urinary retention and hypertension. The patient apparently has been in his usual state of health until the past several weeks when she has had increase in fluid retention and shortness of breath.  She presented to [facility] Emergency Room where a chest x-ray did reveal evidence for pulmonary edema.  The patient has been treated with intravenous furosemide with diuresis and overall initial clinical improvement.  Admission labs were notable for a BUN and creatinine of 36 and 1.76, respectively. The patient has ruled out for myocardial infarction by CPK, isoenzymes and troponin. The patient is also noted to be anemic with a hemoglobin and hematocrit of 8.3 and 27.7, respectively. White count was elevated at 13,700.   PAST MEDICAL HISTORY:  1.  Hypertension.  2.  Chronic kidney disease.  3.  Urinary retention and recurrent UTIs. 4.  Chronic kidney disease.  5.  Obesity.   MEDICATIONS: Valsartan 80 mg daily, furosemide 40 mg daily, vitamin B12 at 500 mcg b.i.d., Symbicort 2 puffs b.i.d., paroxetine 37.5 mg daily, omeprazole 20 mg daily, levothyroxine 75 mcg daily, hydroxyzine 1 tab daily, gabapentin 200 mg t.i.d., allopurinol 100 mg b.i.d., acetaminophen p.r.n.   SOCIAL HISTORY: The patient currently lives with her daughter. She has a remote tobacco abuse history.   FAMILY HISTORY: No immediate family history of coronary artery disease or myocardial infarction.   REVIEW OF SYSTEMS:  CONSTITUTIONAL: No fever or chills.  EYES: No blurry vision.  EARS: No hearing loss.  RESPIRATORY: The patient has had shortness of breath.  CARDIOVASCULAR: The patient denies  chest pain.  GASTROINTESTINAL: No nausea, vomiting, or diarrhea.  GENITOURINARY: No dysuria or hematuria.  ENDOCRINE: No polyuria or polydipsia.  MUSCULOSKELETAL: No arthralgias or myalgias.  NEUROLOGICAL: No focal muscle weakness or numbness.  PSYCHOLOGICAL: No depression or anxiety.   PHYSICAL EXAMINATION:  VITAL SIGNS: Blood pressure 119/89, pulse 97, respirations 22, temperature 97.6, pulse oximetry 100%.  HEENT: Pupils equal, reactive to light and accommodation.  NECK: Supple without thyromegaly.  LUNGS: Decreased breath sounds in both bases.  CARDIOVASCULAR: Normal JVP. Normal PMI. Regular rate and rhythm. Normal S1, S2. No appreciable gallop, murmur, or rub.  ABDOMEN: Soft and nontender.  EXTREMITIES: There was 1+ bilateral pedal edema.  MUSCULOSKELETAL: Normal muscle tone.  NEUROLOGIC: The patient is alert and oriented x 3. Motor and sensory both grossly intact.   IMPRESSION:  A 80 year old female who presents with respiratory failure, congestive heart failure, very likely multifactorial secondary to diastolic congestive heart failure, as well as acute on chronic renal failure, urinary retention, and dietary noncompliance.   RECOMMENDATIONS:  1.  Agree with overall current therapy.  2.  Continue diuresis.  3.  Defer full dose anticoagulation.  4.  Review 2-D echocardiogram.  5.  Further recommendations pending echocardiogram results.     ____________________________ Isaias Cowman, MD ap:DT D: 07/03/2014 16:54:24 ET T: 07/03/2014 19:39:32 ET JOB#: [id number]  cc: Isaias Cowman, MD, <Dictator> Isaias Cowman MD ELECTRONICALLY SIGNED 07/05/2014 16:02

## 2015-02-23 NOTE — Discharge Summary (Signed)
PATIENT NAME:  Kathy Guzman, Kathy Guzman MR#:  [id number] DATE OF BIRTH:  [birth year]  DATE OF ADMISSION:  11/16/2013 DATE OF DISCHARGE:  11/21/2013  DISCHARGE DIAGNOSES:  1.  Intractable low back pain, right hip pain.  2.  Right upper lobe pneumonia.  3.  Morbid obesity.  4.  Symtomatic Anemia requiring transfusion 5.  Hypothyroidism.  6.  Depression.  7.  Acute renal failure, resolved.  8.  Generalized weakness.  9.  Chronic respiratory failure with chronic obstructive pulmonary disease.   CHIEF COMPLAINT: Pain in the right buttock and low back area, generalized weakness, recent fall, difficulty with urination.   HISTORY OF PRESENT ILLNESS: Kathy Guzman is a 80 year old female with a history of hypertension, morbid obesity, anxiety, depression, hypothyroidism, GERD, chronic respiratory failure, COPD, gout, and diastolic congestive heart failure who was seen in the clinic accompanied by family complaining of generalized weakness. The patient reportedly fell as she was trying to get into her bed and has been complaining of pain in the low back as well as right hip. She also has been having some difficulty with urination associated with dysuria and cold chills. In addition, the patient has noticed a rash on the medial aspect of her left thigh and groin rash as well.   PAST MEDICAL HISTORY: Significant for COPD with chronic respiratory failure on 2 liters nasal cannula oxygen, hypothyroidism, gout, diastolic congestive heart failure, history of osteoarthritis, hypertension, diverticulosis, hypothyroidism, and history of urethral stricture. She has had previous dilatation of urethral stricture with Dr. Bernardo Heater, history of previous cholecystectomy, history of bladder tack surgery, left radius fracture, and left arm skin cancer removal.   PHYSICAL EXAMINATION: GENERAL: She was anxious, pale-looking, morbidly obese, appeared to be in significant pain and was tearful at times. Marland Kitchen VITAL SIGNS: Pulse was 98,  blood pressure 112/66, O2 sat 100% on 2 liters nasal cannula.  HEENT: NCAT. LUNGS: Diminished air entry bilaterally with no use of accessory muscles of respirations. Lungs were clear.  HEART: S1, S2.  ABDOMEN: Soft and obese with mild tenderness in the lower quadrant area.  EXTREMITIES: 2+ edema.  SKIN: Evidence of an erythematous rash on the left upper thigh and inguinal region.  NEUROLOGIC: Nonfocal.   HOSPITAL COURSE: The patient was admitted to [facility] for pain control. She was initially started on IV Lasix and subsequently this was DC'd because of worsening renal function and her creatinine went up as high as 2.67 and was subsequently starting to trend downwards. She was ruled out for a MI with negative troponins. Chest x-ray did show evidence of suboptimal inspiration with atelectasis or bronchopneumonia of the right upper lobe. There was stable marked cardiomegaly without pulmonary edema. X-ray of the right hip did not show any osseous abnormalities. Osteoarthritic changes were appreciated and x-ray of the lumbar spine did not show any evidence of osseous abnormality. There was degenerative disk disease in the lower lumbar spine. During her stay in the hospital, the patient was also noted to be anemic with hemoglobin dropping to 7 and responded to blood transfusion. With 1 unit her hemoglobin came up to 8.6. She was also seen by physical therapy and it was felt that she would benefit from rehab and she was discharged in stable condition to rehab on the following medications.   DISCHARGE MEDICATIONS: 1.  Levaquin 250 mg p.o. daily for 5 days. 2.  Gabapentin 200 mg t.i.d. 3.  Nystatin 1 application topically 3 times a day on affected area for 2 weeks.  4.  Hydrocodone/acetaminophen 5/325 mg 1 tablet every 6 hours p.r.n. for pain. 5.  Hydroxyzine 25 mg 1 tablet at bedtime. 6.  Paroxetine 37.5 mg once a day. 7.  Allopurinol 200 mg once a day. 8.  Levothyroxine 75 mcg once a day. 9.  Lasix 40 mg  once a day.  10.  Omeprazole 20 mg once a day. 11.  Valsartan 40 mg once a day.   DISCHARGE INSTRUCTIONS: The patient was advised oxygen 2 liters via nasal cannula and advised physical therapy and follow up with me, Dr. Ginette Pitman, in 2 week.   TOTAL TIME SPENT IN DISCHARGING THE PATIENT: 35 minutes.  ____________________________ Tracie Harrier, MD vh:sb D: 11/21/2013 13:05:20 ET T: 11/21/2013 14:15:33 ET JOB#: [id number]  cc: Tracie Harrier, MD, <Dictator> Tracie Harrier MD ELECTRONICALLY SIGNED 11/30/2013 13:23

## 2015-02-23 NOTE — H&P (Signed)
PATIENT NAME:  Kathy Guzman, Kathy Guzman MR#:  [id number] DATE OF BIRTH:  [birth year]  DATE OF ADMISSION:  11/16/2013  CHIEF COMPLAINT: 1.  Pain in the right buttock and low back area.  2.  Generalized weakness.  3.  A recent fall.  4.  Difficulty in urination.   HISTORY OF PRESENT ILLNESS:  Kathy Guzman is a 80 year old female with history of hypertension, morbid obesity, anxiety, depression, hypothyroidism, GERD, chronic respiratory failure, diastolic heart failure, COPD, gout, who presented to the clinic complaining of generalized weakness. She also has been complaining of pain in the right low back area and right hip and both lower extremities. The patient states that she was trying to get up into her bed and fell approximately 4 days back and has been having increasing pain since, denies any fevers, but has been also experiencing cold chills associated with difficulty in urination and dysuria. In addition, the patient has noticed an itchy rash on her left thigh and also has a rash on the medial aspect of both thighs. The patient was recently admitted to [facility] with acute shortness of breath, congestive heart failure, pneumonia and also anemia.   REVIEW OF SYSTEMS:  No fever. She does experience a significant degree of fatigue and chills. Vision is normal. No chest pain. No palpitations, but she has experienced significant lower extremity edema. No nausea, vomiting or diarrhea. Does have dysuria, but denies any hematuria.   PAST MEDICAL HISTORY:   1.  Diastolic congestive heart failure.  2.  COPD with chronic respiratory failure on 3 L nasal cannula oxygen.  3.  Hypothyroidism.  4.  Gout.  5.  Arthritis. 6.  Hypertension.  7.  Positive old right fracture. 8.  Diverticulosis,  9.  Hypothyroidism.  10.  A history of urethral stricture.   PAST SURGICAL HISTORY:  1.  Dilatation of urethral stricture by Dr. Bernardo Heater.  2.  A history of cholecystectomy.  3.  Bladder tack surgery.  4.  Colonoscopy  showing lipoma in the cecum and ascending colon with diverticulitis.  5.  Left radius fracture.   6.  Left arm skin cancer removal.  7.  Most recent EGD done on 10/23/2013 showed normal esophagus and there was localized mild erythematous mucosa in the second part of the duodenum.   CURRENT MEDICATIONS: 1.  Paxil CR 37.5 mg once a day.  2.  Oxygen 2 L nasal cannula.  3.  Gabapentin 200 mg t.i.d.  4.  Symbicort inhaler 160/4.5, 2 puffs b.i.d. 5.  Clonazepam 0.5 mg b.i.d. as needed. 6.  Allopurinol 100 mg 2 tablets once a day.  7.  Levothyroxine 75 mcg once a day.  8.  Felodipine 5 mg once a day. 9.  Omeprazole 20 mg twice a day.  10.  Diovan 80 mg once a day.  11.  Lasix 40 mg once a day.  12.  Albuterol inhaler 2 puffs q.6 p.r.n. 13.  Levothyroxine 75 mcg once a day.  14.  Tylenol 325 mg 2 tabs every 6 hours p.r.n.   ALLERGIES:  TRAMADOL CAUSES ITCHING. FERROUS SULFATE MADE HER SICK AND SHE ALSO HAS NONSPECIFIC ALLERGIES TO NONSTEROIDAL ANTI-INFLAMMATORIES.   PHYSICAL EXAMINATION:  GENERAL:  Anxious and pale-looking, pulse 98, blood pressure 112/66, O2 sat 100% on 2 L nasal cannula. She is morbidly obese, sitting in a wheelchair and appeared to be in significant pain and discomfort and tearful at times.  HEENT:  NCAT.  RESPIRATORY:  No use of accessory muscles of respirations.  NECK:  No thyromegaly.  CHEST WALL:  Normal.  LUNGS:  Clear to auscultation.  HEART:  S1, S2.  ABDOMEN:  Obese, soft. Mild tenderness detected in both lower quadrants.  EXTREMITIES:  2+ edema with decreased range of motion in both shoulders. The patient also had erythematous rash in the left thigh and inguinal area.  NEUROLOGIC:  Alert and oriented. Cranial nerves were intact. Full exam not possible with the patient in the wheelchair.   IMPRESSIONS:  1.  Intractable low back and right hip pain with generalized weakness and difficulty in ambulation.  2.  Morbid obesity.  3.  Anemia.  4.  Dysuria with  difficulty in urination.   PLAN: 1.  The patient admitted to [facility] and we will obtain CBC, metabolic panel, urinalysis, urine culture. We will also obtain x-rays of the lumbar spine and right hip and pelvis.  2.  Hypertension. Continue Diovan.  3.  Hypothyroidism. Continue levothyroxine.  4.  Depression. Continue Paxil. 5.  Generalized weakness with anemia-If hemoglobin is  below 7.5, she will need  blood transfusion. 6.  For intractable pain, we will start her on hydrocodone and morphine p.r.n.   Discussed plan with the patient and family, who are in agreement.   CODE STATUS:  THE PATIENT IS A DO NOT RESUSCITATE STATUS.     Total time spent in admission of patient 55 minutes ____________________________ Tracie Harrier, MD vh:jm D: 11/16/2013 16:46:51 ET T: 11/16/2013 18:03:41 ET JOB#: [id number]  cc: Tracie Harrier, MD, <Dictator> Tracie Harrier MD ELECTRONICALLY SIGNED 11/30/2013 13:21

## 2015-02-23 NOTE — Consult Note (Signed)
Chief Complaint:  Subjective/Chief Complaint Left shoulder pain   Radiology Results: CT:    31-Oct-15 18:46, CT Shoulder Left Without Contrast  CT Shoulder Left Without Contrast   REASON FOR EXAM:    Patient complains of persistent pain and instability   of left shoulder.  CT order  COMMENTS:       PROCEDURE: CT  - CT SHOULDER LEFT WO  - Sep 01 2014  6:46PM     CLINICAL DATA:  Persistent pain and instability of the left shoulder  since a fall 3 days ago. Limited range of motion.    EXAM:  CT OF THE LEFT SHOULDER WITHOUT CONTRAST    TECHNIQUE:  Multidetector CT imaging was performed according to the standard  protocol. Multiplanar CT image reconstructions were also generated.  COMPARISON:  Radiographs dated 08/30/2014 and 08/31/2014    FINDINGS:  There is an anterior inferior aspect of the left humeral head. There  is an extensive avulsion fracture of the glenoid rim extending from  5:00 to 10:00 position. Fragments aredisplaced anterior medially.  There also small a avulsed fragments of bone from the greater  tuberosity. There is a hemarthrosis.    There is a small Hill-Sachs impaction fracture of the humeral head.    There is moderate osteoarthritis of the acromioclavicular joint.    On image 173 of series 11 there appears to be a nondisplaced  fracture of the base of the coracoid. This is not definitive.   IMPRESSION:  1. Anterior inferior dislocation of the left humeral head.  2. Extensive avulsion fractureof the anterior inferior aspects of  the rim of the glenoid.  3. Small avulsion is from the greater tuberosity of the proximal  humerus.  4. Possible fracture of the base of the coracoid.      Electronically Signed    By: Rozetta Nunnery M.D.    On: 09/01/2014 21:11         Verified By: Larey Seat, M.D.,   Assessment/Plan:  Assessment/Plan:  Assessment Persistent left shoulder fracture dislocation   Plan Discussed treatment with Dr Ginette Pitman.  He feels  that she is at high risk for major surgery with poor motivation.  Dr Mack Guise and I have reviewed the ct scan and X-rays and feel that she would need a major operation by a dedicated shoulder specialist in [facility] or [place].  Currently she does not seem to be a candidate for that and thus we will keep her in a sling and reevalute after she goes to rehabilitation facility.   Electronic Signatures: Park Breed (MD)  (Signed [phone] 15:01)  Authored: Chief Complaint, Radiology Results, Assessment/Plan   Last Updated: 02-Nov-15 15:01 by Park Breed (MD)

## 2015-02-23 NOTE — Consult Note (Signed)
PATIENT NAME:  Kathy Guzman, Kathy Guzman MR#:  [id number] DATE OF BIRTH:  August 07, 1935  DATE OF CONSULTATION:  10/18/2013  REFERRING PHYSICIAN:  Tracie Harrier, MD CONSULTING PHYSICIAN:  Lucilla Lame, MD, Andria Meuse, NP PRIMARY GASTROENTEROLOGIST:  Lupita Dawn. Candace Cruise, MD  REASON FOR CONSULTATION: Anemia.   HISTORY OF PRESENT ILLNESS: Kathy Guzman is a 80 year old Caucasian female who was admitted to [facility] December 15 with shortness of breath, found to have pneumonia. She has COPD and is on 3 liters of oxygen with chronic respiratory failure, morbid obesity, diastolic heart failure, and thyroid disease. She has a chronic anemia. Her hemoglobin was 9.6 back in September 2012. Her hemoglobin was 7.8 upon admission, and it has dropped to 6.8 since admission. A unit of packed RBCs has been ordered. She denies any gross GI bleeding or melena. She denies any heartburn, indigestion, significant dysphagia, or odynophagia. She reports an EGD years ago where she had her esophagus dilated. In the records, there is an EGD in 2012 by Dr. Candace Cruise which was normal. She had a colonoscopy last year by Dr. Candace Cruise which showed diverticulosis and a lipoma. She denies any constipation or diarrhea at this time, although she does have a history of chronic diarrhea. Her weight has remained stable. Her appetite is fine. She denies any chest pain, palpitations, or dizziness. She denies any history of transfusion. She does believe she has had anemia for several years now. Her iron is low at 32. Her ferritin is 9 (low normal). Her iron saturation is 10. Her UIBC is 299. Her TIBC is 331. Her folic acid is 10. Her creatinine is normal at 1.26.   PAST MEDICAL AND SURGICAL HISTORY: COPD, [id number], diastolic congestive heart failure with a normal ejection fraction, hypothyroidism, chronic anemia, chronic diarrhea, diverticulosis and diverticulitis, morbid obesity, hypertension, gout, arthritis. She is status post cholecystectomy, hysterectomy, benign  breast surgery, bladder surgery, and lesion removed from her right arm which was benign.   MEDICATIONS PRIOR TO ADMISSION: Valsartan 80 mg daily, Symbicort 2 puffs b.i.d., Paxil 37.5 mg daily, omeprazole 20 mg daily, Synthroid 75 mcg daily, Lasix 40 mg daily, hydroxyzine 1 tablet daily, gabapentin 200 mg t.i.d., felodipine 5 mg daily, clonazepam 0.5 mg b.i.d. p.r.n., allopurinol 200 mg daily.   ALLERGIES: TRAMADOL.   FAMILY HISTORY: There is no family history of colorectal carcinoma or other GI problems.   SOCIAL HISTORY: She denies any tobacco, alcohol, or illicit drug use. She has a daughter who spends the night with her, and she has home health aides during the day.  REVIEW OF SYSTEMS:    PULMONARY: She has had shortness of breath, chills, brown sputum.   Otherwise negative 10-point review of systems.   PHYSICAL EXAMINATION: VITAL SIGNS: Temperature 97.5, pulse 87, respirations 18, blood pressure 158/64, O2 sat 100% on 2 liters via nasal cannula.  GENERAL: She is a morbidly obese Caucasian female who is alert, oriented, pleasant and cooperative, in no acute distress. Her daughter is at the bedside.  HEENT: Sclerae clear, anicteric. Conjunctivae pale. Oropharynx pink and moist, edentulous.  NECK: Supple without any mass or thyromegaly.  CHEST: Heart regular rate and rhythm with normal S1, S2, without murmurs, clicks, rubs, or gallops.  LUNGS: With expiratory wheezes bilaterally and changes of chronic emphysema. No acute distress.  ABDOMEN: Protuberant. Positive bowel sounds x 4. No bruits auscultated. Exam is extremely limited given patient's body habitus. No rebound, tenderness, or guarding. Unable to palpate hepatosplenomegaly or mass.  EXTREMITIES: She has 1+ pretibial edema  bilaterally. No cyanosis or clubbing.  NEUROLOGIC: Grossly intact.  MUSCULOSKELETAL: Good equal movement, overall deconditioned, with good equal strength bilaterally.  SKIN: Intact. Pale, warm and dry.    IMPRESSION: Kathy Guzman is a 80 year old Caucasian female with chronic iron deficiency anemia (a low serum iron and low-normal ferritin) admitted with pneumonia and acute on chronic lung disease. She is oxygen-dependent with chronic obstructive pulmonary disease. Her hemoglobin was 9.6 in September 2012. It was now 7.8 upon admission and dropped 1 gram since admission. She is receiving a 1-unit transfusion of packed RBCs. She is Hemoccult negative today with no gastrointestinal complaints or gross bleeding. She is on PPI at home. Colonoscopy last year by Dr. Candace Cruise showed diverticulosis and a lipoma. EGD in 2012 was normal by Dr. Candace Cruise. There is no acute indication for endoscopic evaluation at this time. She should follow up with her gastroenterologist, Dr. Candace Cruise, or [place] to complete her anemia work-up as an outpatient, unless there is a change in her status this admission. I have discussed this case with Dr. Lucilla Lame.   PLAN: 1.  Remain on PPI daily.  2.  Follow up outpatient for complete work-up of anemia, unless she is unable to maintain her hemoglobin or has gross bleeding this admission.   We will sign off. Thank you for the consult and allowing [place] to participate in the care of Kathy Guzman.   ____________________________ Andria Meuse, NP klj:jcm D: 10/18/2013 14:16:48 ET T: 10/18/2013 14:59:55 ET JOB#: [id number]  cc: Andria Meuse, NP, <Dictator> Tracie Harrier, MD Andria Meuse FNP ELECTRONICALLY SIGNED 11/15/2013 20:09

## 2015-02-23 NOTE — Discharge Summary (Signed)
PATIENT NAME:  Kathy Guzman, Kathy Guzman MR#:  [id number] DATE OF BIRTH:  [birth year]  DATE OF ADMISSION:  07/03/2014 DATE OF DISCHARGE:  07/06/2014  DISCHARGE DIAGNOSES:  1.  Acute shortness of breath secondary to pulmonary edema and diastolic congestive heart failure.  2.  Acute on chronic renal failure.  3.  Hyperkalemia.  4.  Difficulty in voiding.  5.  Morbid obesity.  6.  Anemia.  7.  Generalized weakness.   CHIEF COMPLAINT: Shortness of breath.   HISTORY AND HOSPITAL COURSE: Kathy Guzman is a 80 year old female with a history of morbid obesity, chronic diastolic CHF, chronic respiratory failure on 2 liters nasal cannula, COPD and hypertension who presented to the hospital complaining of shortness of breath of 1 week duration. The patient also has been having difficulty in voiding and is incontinent and has been complaining of lower abdominal spasms as well. Please see H and P for the details. The patient was admitted to [facility] and received intravenous diuretics. She also complained of bilateral upper extremity pain x-rays of which were done, especially of the left upper arm and shoulder was negative for fracture. She was seen by physical therapy. Her shortness of breath improved with IV diuretics. Renal function also improved with serum creatinine improved to 1.34 with EGFR of 38. Troponins were negative. Albumin was low at 2.8. Other labs: Hemoglobin 8.3, WBC count 13.7 and that improved to 12, platelet count 411,000, and MCV of 92. The patient was also seen by urologist, Dr. Yves Dill, who recommended that she have a permanent Foley catheter and after Foley was inserted the patient had significant drainage of urine. She did have an ultrasound of both lower extremities and there was no obvious evidence of DVT. Portable chest x-ray showed cardiomegaly and pulmonary edema. The patient was symptomatically better and was discharged home in stable condition.   DISCHARGE MEDICATIONS: Omeprazole 20 mg p.o. b.i.d.,  Lasix 40 mg once a day, Paxil 37.5 mg once a day, hydroxyzine 1 tablet once a day at bedtime as needed, Gabapentin 100 mg t.i.d., allopurinol 200 mg a day, celecoxib 200 mg once a day, vitamin D2 one capsule once a day, Symbicort inhaler 160/4.5 two puffs b.i.d. valsartan 80 mg once a day, vitamin B12 500 mcg b.i.d., acetaminophen 300 mg 3 tablets b.i.d. p.r.n., levothyroxine 75 mcg a day, nystatin powder 1 application topically b.i.d., aspirin 81 mg daily, Cipro 250 mg p.o. b.i.d. for 5 more days.   DISPOSITION: Home health nursing was arranged for her. The patient was discharged by ambulance and will follow with me, Dr. Ginette Pitman, in 1 to 2 weeks' time. She has been advised to call back with any questions or concerns.   TOTAL TIME SPENT IN DISCHARGING THE PATIENT: 35 minutes. ____________________________ Tracie Harrier, MD vh:sb D: 07/06/2014 13:02:59 ET T: 07/06/2014 13:30:57 ET JOB#: [id number]  cc: Tracie Harrier, MD, <Dictator> Tracie Harrier MD ELECTRONICALLY SIGNED 07/11/2014 13:50

## 2015-02-23 NOTE — Consult Note (Signed)
Brief Consult Note: Diagnosis: Urinary retention. neurogenic bladder.   Patient was seen by consultant.   Consult note dictated.   Recommend further assessment or treatment.   Discussed with Attending MD.   Comments: Extensive evaluation at [facility] resulted in opinion per patient: "Nothing can be done about my bladder". Arrange for monthly catheter exchanges via home health nursing.  Electronic Signatures: Royston Cowper (MD)  (Signed 02-Sep-15 18:51)  Authored: Brief Consult Note   Last Updated: 02-Sep-15 18:51 by Royston Cowper (MD)

## 2015-02-23 NOTE — H&P (Signed)
PATIENT NAME:  Kathy Guzman, SCHATZ MR#:  [id number] DATE OF BIRTH:  1934/12/08  PRIMARY CARE PHYSICIAN: Tracie Harrier, MD  HISTORY OF PRESENT ILLNESS: The patient is a 80 year old Caucasian female with past medical history significant for morbid obesity, history of chronic systolic versus diastolic CHF, history of chronic respiratory failure, on 2 liters of oxygen through nasal cannula with a history of COPD and hypertension, who presented to the hospital with complaints of shortness of breath for approximately 1 week. Apparently, the patient has been having worsening shortness of breath for the past 2 weeks. Her O2 saturation was noted to be via mask 86% on 2 liters of oxygen. She was tachypneic and hospitalist services were contacted for admission as patient's chest x-ray revealed pulmonary edema. She was also complaining of significant problems with urination, apparently difficulty urinating, also incontinent, and burning sensation as well as spasms in the area. She was initiated on Septra on 06/29/2014 by Dr. Ginette Pitman, with no significant improvement of her condition.  PAST MEDICAL HISTORY: Significant for history of admission for back pains as well as pneumonia in January 2015, history of morbid obesity, hypertension, anxiety, depression, hypothyroidism, gastroesophageal reflux disease, chronic respiratory failure, COPD, gout,  diastolic CHF, generalized weakness, lumbar spine degenerative disk disease, back pain, osteoarthritis in knees as well as back.  History of hypothyroidism, old right unclear what fracture, diverticulosis, urethral stricture.   PAST SURGICAL HISTORY: Dilatation of urethral stricture by Dr. Bernardo Heater, history of cholecystectomy, bladder tack  surgery. Colonoscopy showing lipoma in cecum as well as ascending colon diverticulosis,  left radius fracture, left arm skin cancer removal, EGD done in December 2014, revealed normal esophagus and localized mild erythematous mucosa in second part  of duodenum.   MEDICATIONS: According to medical records, the patient is on Septra DS 1 tablet twice daily started on 06/29/2014, history of also Tylenol 500 mg 2 tablets twice daily, allopurinol 100 mg twice daily, celecoxib 200 mg once a day, gabapentin 200 mg 3 times daily, hydroxyzine hydrochloride 25 mg at bedtime as needed, Lasix 40 mg p.o. daily, levothyroxine 75 mcg p.o. daily, omeprazole 20 mg p.o. twice daily, paroxetine 37.5 mg extended release once daily, Symbicort 160/4.5 two puffs twice daily, losartan 80 mg p.o. daily, vitamin B12 500 mg twice daily, and vitamin D2, 50,000 units once weekly.   ALLERGIES: TRAMADOL, ITCHING; IRON SULFATE, NAUSEATED; NONSTEROIDAL ANTI-INFLAMMATORY MEDICATIONS.  REVIEW OF SYSTEMS:   CONSTITUTIONAL: Multiple problems including fatigue and weakness for a long period of time. Chest pains on the left side, increasing whenever she takes deep breaths. Weight gain, some blurring of vision which seems to be chronic. Some snoring, was evaluated for obstructive sleep apnea in the past; however, tested negative. Postnasal drip, some sinus congestion, cough as well as wheezes or shortness of breath, which seemed to be chronic. Coughing up some whitish to yellowish phlegm intermittently; however, coughing up blood which patient's family suspects is from her nasal cavity.  She blows her nose and she has nasal bleeding and then coughs up blood. Also is having some left-sided chest pains intermittently, also lower extremity edema, as well as upper extremity edema which seems to be chronic.  Intermittent palpitations. Complains of abdominal pain, nonspecific, diffuse. Also nausea. Admits to dysuria, hematuria, approximately a week ago over weekend, as well as incontinence and difficulty urinating and difficulty passing urine. Denies any high fevers, weight loss.  EYES: Denies double vision, glaucoma or cataracts.  ENT: Denies any tinnitus, allergies, epistaxis, sinus pain,  dentures, difficulty  swallowing. RESPIRATORY: Admits to painful respirations.  CARDIOVASCULAR: Denies any orthopnea, arrhythmias, or syncope. GASTROINTESTINAL: Admits to nausea. Denies any vomiting or diarrhea, rectal bleeding, change in bowel habits.   GENITOURINARY: Denies frequency. ENDOCRINOLOGY: Denies any polydipsia, nocturia, thyroid problems, heat or cold intolerance, or thirst.  HEMATOLOGIC: Denies anemia, easy bruising, or bleeding, swollen glands.  SKIN: Denies acne, rash, lesions, or change in moles.  MUSCULOSKELETAL: Denies arthritis, cramps, or swelling. NEUROLOGIC: Denies numbness, epilepsy, or tremor.  PSYCHIATRIC: Denies anxiety, insomnia, or depression.  PHYSICAL EXAMINATION:  VITAL SIGNS:  On arrival to the hospital, temperature is 98, pulse 95, respirations were 31, blood pressure 162/63, saturation was 98% on oxygen therapy.  GENERAL: This is a well-developed, obese Caucasian female in moderate distress secondary to discomfort, lying on the stretcher. She is moving in her bed. HEENT: His pupils are equal, reactive to light. Extraocular muscles intact, no icterus or conjunctivitis. Has normal hearing. No pharyngeal erythema. Mucosa is moist.  NECK: No masses. Supple, nontender. Thyroid is not enlarged. No adenopathy. No JVD or carotid bruits bilaterally. Full range of motion.  LUNGS: Clear to auscultation in all fields, although somewhat diminished. A few wheezes were heard especially whenever she moves around.  The patient does labored inspirations with increased effort to breathe, tachypneic. No dullness to percussion, but in mild respiratory distress.  CARDIOVASCULAR: S1, S2 appreciated, rhythm is regular. PMI not lateralized.  CHEST:  Nontender to palpation.  Normal peripheral pulses. No lower extremity edema, calf tenderness, or cyanosis was noted.  ABDOMEN: Soft, nontender. Bowel sounds are present. No hepatosplenomegaly or masses were noted. Protuberant, very difficult  to palpate because of severe obesity. MUSCLE STRENGTH: Able to move all extremities. No cyanosis, degenerative joint disease, or kyphosis. The patient does have discomfort on palpation of her calves, but actually gait is not tested.  SKIN: Did not reveal any rashes, lesions, erythema, nodularity, or induration. It was warm and dry to palpation. LYMPHATIC: No adenopathy in the cervical region.  NEUROLOGIC: Cranial nerves grossly intact. Sensory is intact. No dysarthria or aphasia. The patient is alert, oriented to time, person and place, cooperative. Memory is good. PSYCHIATRIC: No significant confusion, agitation, or depression noted.   LABORATORY DATA: BMP showed BUN and creatinine were 36 and 1.76, sodium 134, potassium 5.6. Estimated GFR for non African American would be 27. Troponin level less than 0.02. White blood cell count is elevated to 13.7, hemoglobin was 8.3, platelet count 411,000. No other studies were performed yet. However, in the past the patient's creatinine level was found to be around 1.8 to 1.9 in January 2015. Most recent on 11/21/2013, creatinine was 1.13. Hemoglobin level remains stable. The patient has been anemic for awhile. Urinalysis is just  done and revealed straw-colored, clear urine, negative for glucose, bilirubin, or ketones. Specific gravity 1.005, pH was 6.0, negative for blood, protein, nitrites, trace leukocyte esterase, 1 red blood cell, 4 white blood cells, 1+ bacteria, less than 1 epithelial cell.   RADIOLOGIC STUDIES: Chest x-ray, portable single view, 07/03/2014, revealed cardiomegaly and pulmonary edema.   EKG showed sinus rhythm with premature atrial complexes at 95 beats per minute, left axis deviation, questionable anterior infarct with poor RV progression; no significant change since prior EKG done in 11/10/2013.   ASSESSMENT AND PLAN: 1. Acute pulmonary edema due to acute on chronic diastolic congestive heart failure exacerbation. Admit the patient to the  medical floor. Continue oxygen therapy. Continue Lasix intravenous, following her oxygen saturations, get echocardiogram done if it was  not done recently.  2.  Chronic renal failure, for which she was diuresed. 3. Hyperkalemia. Continue the patient on Lasix. Follow patient's potassium level. 4.  Dysuria, questionable urinary tract infection. Will start patient on ciprofloxacin as well as Pyridium and follow up cultures.  5.  Questionable urinary retention, overactive bladder. The patient needs urogynecological evaluation.  No Foley catheter is placed by Emergency Room physician due to problems with urinating. The patient will need to have Foley catheter resumed at some point, and voiding trial initiated.   TIME SPENT: Fifty minutes.   ____________________________ Theodoro Grist, MD rv:LT D: 07/03/2014 14:04:00 ET T: 07/03/2014 18:19:35 ET JOB#: [id number]  cc: Theodoro Grist, MD, <Dictator> Tracie Harrier, MD [place] MD ELECTRONICALLY SIGNED 07/31/2014 14:55

## 2015-03-19 DIAGNOSIS — J410 Simple chronic bronchitis: Secondary | ICD-10-CM

## 2015-03-19 DIAGNOSIS — J449 Chronic obstructive pulmonary disease, unspecified: Secondary | ICD-10-CM | POA: Insufficient documentation

## 2015-03-19 DIAGNOSIS — I5032 Chronic diastolic (congestive) heart failure: Secondary | ICD-10-CM | POA: Insufficient documentation

## 2015-06-10 ENCOUNTER — Encounter: Payer: Self-pay | Admitting: Emergency Medicine

## 2015-06-10 ENCOUNTER — Other Ambulatory Visit: Payer: Self-pay

## 2015-06-10 ENCOUNTER — Emergency Department: Payer: Medicare Other

## 2015-06-10 ENCOUNTER — Emergency Department
Admission: EM | Admit: 2015-06-10 | Discharge: 2015-06-11 | Disposition: A | Discharge status: Home / Self Care | Payer: Medicare Other | Attending: Student | Admitting: Student

## 2015-06-10 DIAGNOSIS — J029 Acute pharyngitis, unspecified: Secondary | ICD-10-CM

## 2015-06-10 DIAGNOSIS — N390 Urinary tract infection, site not specified: Secondary | ICD-10-CM | POA: Diagnosis not present

## 2015-06-10 DIAGNOSIS — R059 Cough, unspecified: Secondary | ICD-10-CM

## 2015-06-10 DIAGNOSIS — I10 Essential (primary) hypertension: Secondary | ICD-10-CM | POA: Diagnosis not present

## 2015-06-10 DIAGNOSIS — J441 Chronic obstructive pulmonary disease with (acute) exacerbation: Secondary | ICD-10-CM | POA: Diagnosis not present

## 2015-06-10 DIAGNOSIS — Z79899 Other long term (current) drug therapy: Secondary | ICD-10-CM | POA: Diagnosis not present

## 2015-06-10 DIAGNOSIS — Z87891 Personal history of nicotine dependence: Secondary | ICD-10-CM | POA: Insufficient documentation

## 2015-06-10 DIAGNOSIS — Z7951 Long term (current) use of inhaled steroids: Secondary | ICD-10-CM | POA: Insufficient documentation

## 2015-06-10 DIAGNOSIS — R0602 Shortness of breath: Secondary | ICD-10-CM

## 2015-06-10 DIAGNOSIS — R05 Cough: Secondary | ICD-10-CM

## 2015-06-10 DIAGNOSIS — R197 Diarrhea, unspecified: Secondary | ICD-10-CM | POA: Diagnosis not present

## 2015-06-10 DIAGNOSIS — R1110 Vomiting, unspecified: Secondary | ICD-10-CM | POA: Insufficient documentation

## 2015-06-10 LAB — BRAIN NATRIURETIC PEPTIDE: B Natriuretic Peptide: 66 pg/mL (ref 0.0–100.0)

## 2015-06-10 LAB — COMPREHENSIVE METABOLIC PANEL
ALBUMIN: 3 g/dL — AB (ref 3.5–5.0)
ALK PHOS: 97 U/L (ref 38–126)
ALT: 14 U/L (ref 14–54)
ANION GAP: 9 (ref 5–15)
AST: 16 U/L (ref 15–41)
BILIRUBIN TOTAL: 0.2 mg/dL — AB (ref 0.3–1.2)
BUN: 15 mg/dL (ref 6–20)
CO2: 25 mmol/L (ref 22–32)
CREATININE: 0.84 mg/dL (ref 0.44–1.00)
Calcium: 8.8 mg/dL — ABNORMAL LOW (ref 8.9–10.3)
Chloride: 103 mmol/L (ref 101–111)
GFR calc Af Amer: >60 mL/min (ref >60)
Glucose, Bld: 86 mg/dL (ref 65–99)
Potassium: 4.6 mmol/L (ref 3.5–5.1)
Sodium: 137 mmol/L (ref 135–145)
Total Protein: 7 g/dL (ref 6.5–8.1)

## 2015-06-10 LAB — URINALYSIS COMPLETE WITH MICROSCOPIC (ARMC ONLY)
Bilirubin Urine: NEGATIVE
Glucose, UA: NEGATIVE mg/dL
Hgb urine dipstick: NEGATIVE
Ketones, ur: NEGATIVE mg/dL
NITRITE: NEGATIVE
PROTEIN: NEGATIVE mg/dL
Specific Gravity, Urine: 1.004 — ABNORMAL LOW (ref 1.005–1.030)
pH: 6 (ref 5.0–8.0)

## 2015-06-10 LAB — TROPONIN I: Troponin I: <0.03 ng/mL (ref <0.031)

## 2015-06-10 MED ORDER — LEVOFLOXACIN 750 MG PO TABS: 750.0000 mg | ORAL_TABLET | Freq: Every day | ORAL | Status: DC

## 2015-06-10 MED ORDER — IPRATROPIUM-ALBUTEROL 0.5-2.5 (3) MG/3ML IN SOLN
3.0000 mL | Freq: Once | RESPIRATORY_TRACT | Status: AC
  Administered 2015-06-10: 3 mL via RESPIRATORY_TRACT
  Filled 2015-06-10: qty 3

## 2015-06-10 MED ORDER — BENZONATATE 100 MG PO CAPS: 100.0000 mg | ORAL_CAPSULE | Freq: Three times a day (TID) | ORAL | Status: DC | PRN

## 2015-06-10 MED ORDER — LEVOFLOXACIN 750 MG PO TABS
750.0000 mg | ORAL_TABLET | Freq: Once | ORAL | Status: AC
  Administered 2015-06-11: 750 mg via ORAL
  Filled 2015-06-10: qty 1

## 2015-06-10 NOTE — ED Provider Notes (Signed)
Kathy Guzman  ____________________________________________  Time seen: Approximately 9:07 PM  I have reviewed the triage vital signs and the nursing notes.   HISTORY  Chief Complaint Shortness of Breath and Cough    HPI Kathy Guzman is a 80 y.o. female with history of CHF, COPD with 3 L home oxygen requirement, GERD, hypertension, hyperlipidemia, chronic indwelling foley catheter who presents for evaluation of one week of cough productive of brown phlegm, shortness of breath gradual onset, constant since onset. He is also had sore throats, runny nose, hoarse voice. She had one episode of posttussive emesis today but otherwise has had no vomiting. She denies fevers or chills. She has had diarrhea intermittently. She denies any chest pain or abdominal pain. No modifying factors. Current severity of symptoms is moderate. No known sick contacts.   Past Medical History  Diagnosis Date  . CHF (congestive heart failure)   . Hypertension   . Hyperlipidemia   . COPD (chronic obstructive pulmonary disease)   . GERD (gastroesophageal reflux disease)   . Hypothyroid   . Osteoarthritis   . Depression   . Anxiety   . Skin cancer     Patient Active Problem List   Diagnosis Date Noted  . Chronic diastolic heart failure 10/62/6948  . COPD (chronic obstructive pulmonary disease) 03/19/2015    Past Surgical History  Procedure Laterality Date  . Partial hysterectomy    . Cholecystectomy      Current Outpatient Rx  Name  Route  Sig  Dispense  Refill  . acetaminophen (TYLENOL) 325 MG tablet   Oral   Take 650 mg by mouth 3 (three) times daily as needed for moderate pain.         Marland Kitchen albuterol (PROVENTIL HFA;VENTOLIN HFA) 108 (90 BASE) MCG/ACT inhaler   Inhalation   Inhale 2 puffs into the lungs every 4 (four) hours as needed for wheezing or shortness of breath.         . calcium carbonate (TUMS) 500 MG chewable tablet    Oral   Chew 1 tablet by mouth every 4 (four) hours as needed for indigestion or heartburn.         . ferrous sulfate 325 (65 FE) MG tablet   Oral   Take 325 mg by mouth 2 (two) times daily with a meal.         . furosemide (LASIX) 20 MG tablet   Oral   Take 20 mg by mouth daily.         Marland Kitchen gabapentin (NEURONTIN) 300 MG capsule   Oral   Take 300 mg by mouth 3 (three) times daily.         Marland Kitchen guaiFENesin (MUCINEX) 600 MG 12 hr tablet   Oral   Take 600 mg by mouth 2 (two) times daily.         Marland Kitchen loratadine (CLARITIN) 10 MG tablet   Oral   Take 10 mg by mouth daily.         Marland Kitchen nystatin cream (MYCOSTATIN)   Topical   Apply 1 application topically every 6 (six) hours as needed (for yeast). Apply to all skin folds and groin.         Marland Kitchen PARoxetine (PAXIL) 30 MG tablet   Oral   Take 30 mg by mouth daily.         Marland Kitchen tiotropium (SPIRIVA) 18 MCG inhalation capsule   Inhalation   Place 18 mcg into inhaler and  inhale daily.         . valsartan (DIOVAN) 160 MG tablet   Oral   Take 160 mg by mouth daily.         Marland Kitchen allopurinol (ZYLOPRIM) 100 MG tablet   Oral   Take 100 mg by mouth daily.          . budesonide-formoterol (SYMBICORT) 160-4.5 MCG/ACT inhaler   Inhalation   Inhale 2 puffs into the lungs 2 (two) times daily.         . clonazePAM (KLONOPIN) 0.5 MG tablet   Oral   Take 0.5 mg by mouth 2 (two) times daily.         . Cyanocobalamin (VITAMIN B 12 PO)   Oral   Take 500 mcg by mouth 2 times daily at 12 noon and 4 pm.         . hydrOXYzine (ATARAX/VISTARIL) 25 MG tablet   Oral   Take 25 mg by mouth 2 (two) times daily as needed for itching.          . levothyroxine (SYNTHROID, LEVOTHROID) 75 MCG tablet   Oral   Take 75 mcg by mouth daily before breakfast.         . omeprazole (PRILOSEC) 20 MG capsule   Oral   Take 20 mg by mouth daily.            Allergies Tramadol  History reviewed. No pertinent family history.  Social  History History  Substance Use Topics  . Smoking status: Former Smoker    Quit date: 03/18/1994  . Smokeless tobacco: Never Used  . Alcohol Use: No    Review of Systems Constitutional: No fever/chills Eyes: No visual changes. ENT: No sore throat. Cardiovascular: Denies chest pain. Respiratory: +shortness of breath. Gastrointestinal: No abdominal pain.  No nausea, + posttussive vomiting.  +diarrhea.  No constipation. Genitourinary: Negative for dysuria. Musculoskeletal: Negative for back pain. Skin: Negative for rash. Neurological: Negative for headaches, focal weakness or numbness.  10-point ROS otherwise negative.  ____________________________________________   PHYSICAL EXAM:  Filed Vitals:   06/10/15 2100 06/10/15 2108 06/10/15 2115  BP:  158/81   Pulse:  84 77  Temp:  98.2 F (36.8 C)   TempSrc:  Oral   Resp: 19 22 19   SpO2:  100% 100%      Constitutional: Alert and oriented. Chronically ill-appearing but in no acute distress. Speaking with a hoarse voice. Coughing frequently.  Eyes: Conjunctivae are normal. PERRL. EOMI. Head: Atraumatic. Nose: No congestion/rhinnorhea. Mouth/Throat: Mucous membranes are moist.  Oropharynx mildly erythematous. Neck: No stridor.   Cardiovascular: Normal rate, regular rhythm. Grossly normal heart sounds.  Good peripheral circulation. Respiratory: Normal respiratory effort.  No retractions. Diffuse mild expiratory wheeze with good air movement.  Gastrointestinal: Soft and nontender. morbidly obese abdomen  Genitourinary:  Foley in place  Musculoskeletal: No lower extremity tenderness nor edema.  No joint effusions. Neurologic:  Normal speech and language. No gross focal neurologic deficits are appreciated.  Skin:  Skin is warm, dry and intact. No rash noted. Psychiatric: Mood and affect are normal. Speech and behavior are normal.  ____________________________________________   LABS (all labs ordered are listed, but only  abnormal results are displayed)  Labs Reviewed  CULTURE, GROUP A STREP ([facility] ONLY)  CULTURE, BLOOD (ROUTINE X 2)  CULTURE, BLOOD (ROUTINE X 2)  CBC WITH DIFFERENTIAL/PLATELET  COMPREHENSIVE METABOLIC PANEL  TROPONIN I  BRAIN NATRIURETIC PEPTIDE  URINALYSIS COMPLETEWITH MICROSCOPIC ([facility] ONLY)   ____________________________________________  EKG  ED ECG REPORT I, Joanne Gavel, the attending physician, personally viewed and interpreted this ECG.   Date: 06/10/2015  EKG Time: 21:06  Rate: 82  Rhythm: normal sinus rhythm  Axis: left  Intervals:none  ST&T Change: No acute ST segment elevation.  ____________________________________________  RADIOLOGY  CXR FINDINGS: There is no edema or consolidation. Heart is enlarged with pulmonary vascularity within normal limits. No adenopathy. There is atherosclerotic change in the aorta. No bone lesions.  IMPRESSION: Cardiomegaly. No edema or consolidation.  ____________________________________________   PROCEDURES  Procedure(s) performed: None  Critical Care performed: No  ____________________________________________   INITIAL IMPRESSION / ASSESSMENT AND PLAN / ED COURSE  Pertinent labs & imaging results that were available during my care of the patient were reviewed by me and considered in my medical decision making (see chart for details).  Kathy Guzman is a 80 y.o. female with history of CHF, COPD with 3 L home oxygen requirement, GERD, hypertension, hyperlipidemia, chronic indwelling foley catheter who presents for evaluation of one week of cough productive of brown phlegm, shortness of breath, sore throat, hoarse voice, posttussive emesis. On exam, she is chronically ill-appearing but in no acute distress. She is maintaining adequate oxygen saturation on her home oxygen requirement. She is not tachycardic, not hypoxic. She has mild diffuse expiratory wheeze. Clinical picture is most consistent with viral illness  however we'll obtain screening labs, check for strep throat, evaluate for any urinary tract infection, obtain basic screening labs. We'll give an DuoNeb which she is scheduled to receive. Reassess for disposition.   ----------------------------------------- 11:17 PM on 06/10/2015 -----------------------------------------  X-ray clear, no edema or consolidation. CBC pending. Troponin negative. BNP not elevated. Urinalysis with 3+ leuk esterase, 6-30 white blood cells, equivocal for UTI  we'll start Levaquin while awaiting culture and anticipate discharge with same as long as her vitals remain stable and her breathing status improves with DuoNeb. Care transferred to Dr. Dahlia Client at this time pending reassessment, and CBC as well as strep test. ____________________________________________   FINAL CLINICAL IMPRESSION(S) / ED DIAGNOSES  Final diagnoses:  Cough  SOB (shortness of breath)  Sore throat      Joanne Gavel, MD 06/10/15 2322

## 2015-06-10 NOTE — ED Notes (Addendum)
Pt presents to ED via EMS with c/o SOB and cough x1 week. Expiratory wheeze noted at arrival. Pt on 3L [place] at skilled facility. Per EMS, BP-134/90, 100% on 3L [place], HR-90, Temp.-99.1. Pt from [facility].

## 2015-06-10 NOTE — ED Notes (Signed)
[facility] main lab, [facility], called to ad urine culture, states will ad.

## 2015-06-11 DIAGNOSIS — J441 Chronic obstructive pulmonary disease with (acute) exacerbation: Secondary | ICD-10-CM | POA: Diagnosis not present

## 2015-06-11 LAB — CBC WITH DIFFERENTIAL/PLATELET
BASOS ABS: 0.2 10*3/uL — AB (ref 0–0.1)
BASOS PCT: 2 %
EOS ABS: 1.2 10*3/uL — AB (ref 0–0.7)
EOS PCT: 9 %
HCT: 33.7 % — ABNORMAL LOW (ref 35.0–47.0)
Hemoglobin: 10.6 g/dL — ABNORMAL LOW (ref 12.0–16.0)
LYMPHS PCT: 21 %
Lymphs Abs: 3 10*3/uL (ref 1.0–3.6)
MCH: 29 pg (ref 26.0–34.0)
MCHC: 31.4 g/dL — ABNORMAL LOW (ref 32.0–36.0)
MCV: 92.3 fL (ref 80.0–100.0)
MONO ABS: 0.6 10*3/uL (ref 0.2–0.9)
Monocytes Relative: 4 %
NEUTROS PCT: 64 %
Neutro Abs: 9.2 10*3/uL — ABNORMAL HIGH (ref 1.4–6.5)
Platelets: 355 10*3/uL (ref 150–440)
RBC: 3.65 MIL/uL — ABNORMAL LOW (ref 3.80–5.20)
RDW: 15.7 % — AB (ref 11.5–14.5)
WBC: 14.3 10*3/uL — ABNORMAL HIGH (ref 3.6–11.0)

## 2015-06-11 LAB — POCT RAPID STREP A: STREPTOCOCCUS, GROUP A SCREEN (DIRECT): NEGATIVE

## 2015-06-11 NOTE — ED Provider Notes (Signed)
-----------------------------------------   2:34 AM on 06/11/2015 -----------------------------------------   Blood pressure 108/61, pulse 88, temperature 98.2 F (36.8 C), temperature source Oral, resp. rate 20, SpO2 100 %.  Assuming care from Dr. Edd Fabian.  In short, Kathy Guzman is a 80 y.o. female with a chief complaint of Shortness of Breath and Cough .  Refer to the original H&P for additional details.  The current plan of care is to follow up the results of the CBC and strep swab and disposition the patient once I received the results..   The patient does have white blood cell count of 14.3 but her strep test is negative. The patient likely does have some bronchitis as well as this mild UTI. The patient has been discharged with level floxacillin which was written by Dr. Edd Fabian for the patient. I did discuss this with the patient's daughters. They report that they are concerned that the patient had some lip swelling and some facial swelling as well. She has not been on any new medications nor has she had any other worsened symptoms. At this time the swelling may be due to some fluid retention. I did inform the family that the patient will need to be evaluated again by her primary care physician at the nursing home to determine if she is having improving symptoms or if she needs further studies. Otherwise the patient is ready to be discharged home and discharge to home. Her vital signs remained unremarkable during her time in the emergency department.  Loney Hering, MD 06/11/15 [phone]

## 2015-06-12 LAB — CULTURE, GROUP A STREP (THRC): Special Requests: NORMAL

## 2015-06-12 LAB — URINE CULTURE

## 2015-06-17 LAB — CULTURE, BLOOD (ROUTINE X 2)
CULTURE: NO GROWTH
Culture: NO GROWTH

## 2016-01-07 ENCOUNTER — Emergency Department: Payer: Medicare Other

## 2016-01-07 ENCOUNTER — Inpatient Hospital Stay
Admission: EM | Admit: 2016-01-07 | Discharge: 2016-01-13 | DRG: 689 | Disposition: A | Discharge status: Skilled Nursing Facility | Payer: Medicare Other | Attending: Internal Medicine | Admitting: Internal Medicine

## 2016-01-07 DIAGNOSIS — Z888 Allergy status to other drugs, medicaments and biological substances status: Secondary | ICD-10-CM

## 2016-01-07 DIAGNOSIS — N3000 Acute cystitis without hematuria: Principal | ICD-10-CM

## 2016-01-07 DIAGNOSIS — N3289 Other specified disorders of bladder: Secondary | ICD-10-CM | POA: Diagnosis present

## 2016-01-07 DIAGNOSIS — R339 Retention of urine, unspecified: Secondary | ICD-10-CM | POA: Diagnosis present

## 2016-01-07 DIAGNOSIS — Z85828 Personal history of other malignant neoplasm of skin: Secondary | ICD-10-CM

## 2016-01-07 DIAGNOSIS — Z90710 Acquired absence of both cervix and uterus: Secondary | ICD-10-CM

## 2016-01-07 DIAGNOSIS — N390 Urinary tract infection, site not specified: Secondary | ICD-10-CM

## 2016-01-07 DIAGNOSIS — J069 Acute upper respiratory infection, unspecified: Secondary | ICD-10-CM | POA: Diagnosis present

## 2016-01-07 DIAGNOSIS — G9340 Encephalopathy, unspecified: Secondary | ICD-10-CM | POA: Diagnosis present

## 2016-01-07 DIAGNOSIS — I119 Hypertensive heart disease without heart failure: Secondary | ICD-10-CM | POA: Diagnosis present

## 2016-01-07 DIAGNOSIS — R4182 Altered mental status, unspecified: Secondary | ICD-10-CM

## 2016-01-07 DIAGNOSIS — I509 Heart failure, unspecified: Secondary | ICD-10-CM | POA: Diagnosis present

## 2016-01-07 DIAGNOSIS — Z9049 Acquired absence of other specified parts of digestive tract: Secondary | ICD-10-CM

## 2016-01-07 DIAGNOSIS — D649 Anemia, unspecified: Secondary | ICD-10-CM

## 2016-01-07 DIAGNOSIS — E871 Hypo-osmolality and hyponatremia: Secondary | ICD-10-CM

## 2016-01-07 DIAGNOSIS — M1990 Unspecified osteoarthritis, unspecified site: Secondary | ICD-10-CM | POA: Diagnosis present

## 2016-01-07 DIAGNOSIS — R410 Disorientation, unspecified: Secondary | ICD-10-CM | POA: Diagnosis not present

## 2016-01-07 DIAGNOSIS — E875 Hyperkalemia: Secondary | ICD-10-CM

## 2016-01-07 DIAGNOSIS — J9801 Acute bronchospasm: Secondary | ICD-10-CM | POA: Diagnosis present

## 2016-01-07 DIAGNOSIS — F418 Other specified anxiety disorders: Secondary | ICD-10-CM | POA: Diagnosis present

## 2016-01-07 DIAGNOSIS — K219 Gastro-esophageal reflux disease without esophagitis: Secondary | ICD-10-CM | POA: Diagnosis present

## 2016-01-07 DIAGNOSIS — F329 Major depressive disorder, single episode, unspecified: Secondary | ICD-10-CM | POA: Diagnosis present

## 2016-01-07 DIAGNOSIS — F419 Anxiety disorder, unspecified: Secondary | ICD-10-CM | POA: Diagnosis present

## 2016-01-07 DIAGNOSIS — R0602 Shortness of breath: Secondary | ICD-10-CM

## 2016-01-07 DIAGNOSIS — Z79899 Other long term (current) drug therapy: Secondary | ICD-10-CM

## 2016-01-07 DIAGNOSIS — R52 Pain, unspecified: Secondary | ICD-10-CM

## 2016-01-07 DIAGNOSIS — Z87891 Personal history of nicotine dependence: Secondary | ICD-10-CM

## 2016-01-07 DIAGNOSIS — J449 Chronic obstructive pulmonary disease, unspecified: Secondary | ICD-10-CM | POA: Diagnosis present

## 2016-01-07 DIAGNOSIS — R509 Fever, unspecified: Secondary | ICD-10-CM

## 2016-01-07 DIAGNOSIS — E039 Hypothyroidism, unspecified: Secondary | ICD-10-CM | POA: Diagnosis present

## 2016-01-07 DIAGNOSIS — E785 Hyperlipidemia, unspecified: Secondary | ICD-10-CM | POA: Diagnosis present

## 2016-01-07 LAB — CBC WITH DIFFERENTIAL/PLATELET
BASOS ABS: 0.1 10*3/uL (ref 0–0.1)
Basophils Relative: 1 %
EOS ABS: 1.5 10*3/uL — AB (ref 0–0.7)
EOS PCT: 15 %
HCT: 33 % — ABNORMAL LOW (ref 35.0–47.0)
Hemoglobin: 10.8 g/dL — ABNORMAL LOW (ref 12.0–16.0)
LYMPHS ABS: 2.1 10*3/uL (ref 1.0–3.6)
LYMPHS PCT: 21 %
MCH: 30.2 pg (ref 26.0–34.0)
MCHC: 32.8 g/dL (ref 32.0–36.0)
MCV: 91.9 fL (ref 80.0–100.0)
MONO ABS: 1.2 10*3/uL — AB (ref 0.2–0.9)
Monocytes Relative: 11 %
Neutro Abs: 5.4 10*3/uL (ref 1.4–6.5)
Neutrophils Relative %: 52 %
PLATELETS: 240 10*3/uL (ref 150–440)
RBC: 3.59 MIL/uL — ABNORMAL LOW (ref 3.80–5.20)
RDW: 14.6 % — AB (ref 11.5–14.5)
WBC: 10.3 10*3/uL (ref 3.6–11.0)

## 2016-01-07 LAB — BASIC METABOLIC PANEL
Anion gap: 9 (ref 5–15)
BUN: 21 mg/dL — AB (ref 6–20)
CHLORIDE: 99 mmol/L — AB (ref 101–111)
CO2: 26 mmol/L (ref 22–32)
CREATININE: 0.94 mg/dL (ref 0.44–1.00)
Calcium: 9.1 mg/dL (ref 8.9–10.3)
GFR calc Af Amer: >60 mL/min (ref >60)
GFR calc non Af Amer: 57 mL/min — ABNORMAL LOW (ref >60)
Glucose, Bld: 91 mg/dL (ref 65–99)
Potassium: 5.3 mmol/L — ABNORMAL HIGH (ref 3.5–5.1)
Sodium: 134 mmol/L — ABNORMAL LOW (ref 135–145)

## 2016-01-07 LAB — URINALYSIS COMPLETE WITH MICROSCOPIC (ARMC ONLY)
Bilirubin Urine: NEGATIVE
Glucose, UA: NEGATIVE mg/dL
Ketones, ur: NEGATIVE mg/dL
Nitrite: POSITIVE — AB
PROTEIN: NEGATIVE mg/dL
Specific Gravity, Urine: 1.009 (ref 1.005–1.030)
pH: 5 (ref 5.0–8.0)

## 2016-01-07 LAB — BLOOD GAS, ARTERIAL
ALLENS TEST (PASS/FAIL): POSITIVE — AB
Acid-Base Excess: 2.4 mmol/L (ref 0.0–3.0)
Bicarbonate: 28.8 mEq/L — ABNORMAL HIGH (ref 21.0–28.0)
FIO2: 0.28
O2 SAT: 93.8 %
PATIENT TEMPERATURE: 37
PO2 ART: 73 mmHg — AB (ref 83.0–108.0)
pCO2 arterial: 51 mmHg — ABNORMAL HIGH (ref 32.0–48.0)
pH, Arterial: 7.36 (ref 7.350–7.450)

## 2016-01-07 LAB — LACTIC ACID, PLASMA
LACTIC ACID, VENOUS: 0.9 mmol/L (ref 0.5–2.0)
Lactic Acid, Venous: 0.8 mmol/L (ref 0.5–2.0)

## 2016-01-07 MED ORDER — CYANOCOBALAMIN 500 MCG PO TABS
500.0000 ug | ORAL_TABLET | Freq: Two times a day (BID) | ORAL | Status: DC
  Administered 2016-01-07 – 2016-01-13 (×12): 500 ug via ORAL
  Filled 2016-01-07 (×12): qty 1

## 2016-01-07 MED ORDER — HYDROXYZINE HCL 25 MG PO TABS
25.0000 mg | ORAL_TABLET | Freq: Two times a day (BID) | ORAL | Status: DC | PRN
  Filled 2016-01-07: qty 1

## 2016-01-07 MED ORDER — OXYBUTYNIN CHLORIDE 5 MG PO TABS: 5.0000 mg | ORAL_TABLET | Freq: Three times a day (TID) | ORAL | Status: DC | PRN

## 2016-01-07 MED ORDER — SODIUM POLYSTYRENE SULFONATE 15 GM/60ML PO SUSP
30.0000 g | Freq: Once | ORAL | Status: AC
  Administered 2016-01-07: 30 g via ORAL
  Filled 2016-01-07: qty 120

## 2016-01-07 MED ORDER — CALCIUM CARBONATE ANTACID 500 MG PO CHEW
1.0000 | CHEWABLE_TABLET | ORAL | Status: DC | PRN
Start: 2016-01-07 — End: 2016-01-13

## 2016-01-07 MED ORDER — PIPERACILLIN-TAZOBACTAM 4.5 G IVPB
4.5000 g | Freq: Three times a day (TID) | INTRAVENOUS | Status: DC
  Administered 2016-01-07 – 2016-01-08 (×2): 4.5 g via INTRAVENOUS
  Filled 2016-01-07 (×4): qty 100

## 2016-01-07 MED ORDER — ONDANSETRON HCL 4 MG/2ML IJ SOLN: 4.0000 mg | Freq: Four times a day (QID) | INTRAMUSCULAR | Status: DC | PRN

## 2016-01-07 MED ORDER — ACETAMINOPHEN 500 MG PO TABS
500.0000 mg | ORAL_TABLET | Freq: Three times a day (TID) | ORAL | Status: DC | PRN
  Administered 2016-01-10 – 2016-01-13 (×5): 500 mg via ORAL
  Filled 2016-01-07 (×5): qty 1

## 2016-01-07 MED ORDER — CLONAZEPAM 0.5 MG PO TABS
0.2500 mg | ORAL_TABLET | Freq: Every day | ORAL | Status: DC
  Administered 2016-01-07 – 2016-01-12 (×6): 0.25 mg via ORAL
  Filled 2016-01-07 (×6): qty 1

## 2016-01-07 MED ORDER — PANTOPRAZOLE SODIUM 40 MG PO TBEC
40.0000 mg | DELAYED_RELEASE_TABLET | Freq: Every day | ORAL | Status: DC
  Administered 2016-01-08 – 2016-01-13 (×6): 40 mg via ORAL
  Filled 2016-01-07 (×6): qty 1

## 2016-01-07 MED ORDER — ONDANSETRON HCL 4 MG PO TABS: 4.0000 mg | ORAL_TABLET | Freq: Four times a day (QID) | ORAL | Status: DC | PRN

## 2016-01-07 MED ORDER — FERROUS SULFATE 325 (65 FE) MG PO TABS
325.0000 mg | ORAL_TABLET | Freq: Three times a day (TID) | ORAL | Status: DC
  Administered 2016-01-08 – 2016-01-13 (×16): 325 mg via ORAL
  Filled 2016-01-07 (×16): qty 1

## 2016-01-07 MED ORDER — SODIUM CHLORIDE 0.9% FLUSH
3.0000 mL | Freq: Two times a day (BID) | INTRAVENOUS | Status: DC
  Administered 2016-01-07 – 2016-01-13 (×11): 3 mL via INTRAVENOUS

## 2016-01-07 MED ORDER — TIOTROPIUM BROMIDE MONOHYDRATE 18 MCG IN CAPS
18.0000 ug | ORAL_CAPSULE | Freq: Every day | RESPIRATORY_TRACT | Status: DC
  Filled 2016-01-07: qty 5

## 2016-01-07 MED ORDER — LEVOTHYROXINE SODIUM 75 MCG PO TABS
75.0000 ug | ORAL_TABLET | Freq: Every day | ORAL | Status: DC
  Administered 2016-01-09 – 2016-01-13 (×4): 75 ug via ORAL
  Filled 2016-01-07 (×5): qty 1

## 2016-01-07 MED ORDER — ALBUTEROL SULFATE (2.5 MG/3ML) 0.083% IN NEBU
3.0000 mL | INHALATION_SOLUTION | RESPIRATORY_TRACT | Status: DC | PRN
  Administered 2016-01-09: 3 mL via RESPIRATORY_TRACT
  Filled 2016-01-07: qty 3

## 2016-01-07 MED ORDER — SODIUM CHLORIDE 0.9 % IV SOLN: INTRAVENOUS | Status: DC

## 2016-01-07 MED ORDER — GUAIFENESIN ER 600 MG PO TB12
600.0000 mg | ORAL_TABLET | Freq: Two times a day (BID) | ORAL | Status: DC
  Administered 2016-01-07 – 2016-01-13 (×12): 600 mg via ORAL
  Filled 2016-01-07 (×13): qty 1

## 2016-01-07 MED ORDER — NYSTATIN 100000 UNIT/GM EX CREA
1.0000 "application " | TOPICAL_CREAM | Freq: Three times a day (TID) | CUTANEOUS | Status: DC | PRN
  Filled 2016-01-07: qty 15

## 2016-01-07 MED ORDER — ENOXAPARIN SODIUM 40 MG/0.4ML ~~LOC~~ SOLN
40.0000 mg | Freq: Two times a day (BID) | SUBCUTANEOUS | Status: DC
  Administered 2016-01-08 – 2016-01-13 (×11): 40 mg via SUBCUTANEOUS
  Filled 2016-01-07 (×11): qty 0.4

## 2016-01-07 MED ORDER — IRBESARTAN 150 MG PO TABS
150.0000 mg | ORAL_TABLET | Freq: Every day | ORAL | Status: DC
  Administered 2016-01-08 – 2016-01-13 (×6): 150 mg via ORAL
  Filled 2016-01-07 (×6): qty 1

## 2016-01-07 MED ORDER — MOMETASONE FURO-FORMOTEROL FUM 200-5 MCG/ACT IN AERO
2.0000 | INHALATION_SPRAY | Freq: Two times a day (BID) | RESPIRATORY_TRACT | Status: DC
  Filled 2016-01-07: qty 8.8

## 2016-01-07 MED ORDER — GUAIFENESIN-DM 100-10 MG/5ML PO SYRP: 10.0000 mL | ORAL_SOLUTION | ORAL | Status: DC | PRN

## 2016-01-07 MED ORDER — IPRATROPIUM-ALBUTEROL 0.5-2.5 (3) MG/3ML IN SOLN
3.0000 mL | Freq: Once | RESPIRATORY_TRACT | Status: AC
  Administered 2016-01-07: 3 mL via RESPIRATORY_TRACT
  Filled 2016-01-07: qty 3

## 2016-01-07 MED ORDER — HYDROCODONE-ACETAMINOPHEN 5-325 MG PO TABS
1.0000 | ORAL_TABLET | Freq: Two times a day (BID) | ORAL | Status: DC
  Administered 2016-01-07: 1 via ORAL
  Filled 2016-01-07: qty 1

## 2016-01-07 MED ORDER — CEFTRIAXONE SODIUM 1 G IJ SOLR
1.0000 g | Freq: Once | INTRAMUSCULAR | Status: AC
  Administered 2016-01-07: 1 g via INTRAVENOUS
  Filled 2016-01-07: qty 10

## 2016-01-07 MED ORDER — PAROXETINE HCL 10 MG PO TABS
30.0000 mg | ORAL_TABLET | Freq: Every day | ORAL | Status: DC
  Administered 2016-01-08 – 2016-01-13 (×6): 30 mg via ORAL
  Filled 2016-01-07 (×7): qty 3

## 2016-01-07 MED ORDER — FUROSEMIDE 20 MG PO TABS
20.0000 mg | ORAL_TABLET | Freq: Every day | ORAL | Status: DC
  Administered 2016-01-08 – 2016-01-13 (×6): 20 mg via ORAL
  Filled 2016-01-07 (×6): qty 1

## 2016-01-07 MED ORDER — LORATADINE 10 MG PO TABS
10.0000 mg | ORAL_TABLET | Freq: Every day | ORAL | Status: DC
  Administered 2016-01-08 – 2016-01-13 (×6): 10 mg via ORAL
  Filled 2016-01-07 (×6): qty 1

## 2016-01-07 MED ORDER — GABAPENTIN 300 MG PO CAPS
300.0000 mg | ORAL_CAPSULE | Freq: Three times a day (TID) | ORAL | Status: DC
  Administered 2016-01-07 – 2016-01-13 (×17): 300 mg via ORAL
  Filled 2016-01-07 (×17): qty 1

## 2016-01-07 MED ORDER — ALLOPURINOL 100 MG PO TABS
100.0000 mg | ORAL_TABLET | Freq: Every day | ORAL | Status: DC
  Administered 2016-01-08 – 2016-01-13 (×6): 100 mg via ORAL
  Filled 2016-01-07 (×6): qty 1

## 2016-01-07 NOTE — ED Notes (Signed)
MD at bedside. 

## 2016-01-07 NOTE — ED Notes (Signed)
Patient transported to CT 

## 2016-01-07 NOTE — H&P (Signed)
[facility] [place] NAME: Kathy Guzman    MR#:  [id number]  DATE OF BIRTH:  [birth year]  DATE OF ADMISSION:  01/07/2016  PRIMARY CARE PHYSICIAN: Margarita Rana, MD   REQUESTING/REFERRING PHYSICIAN:   CHIEF COMPLAINT:   Chief Complaint  Patient presents with  . Altered Mental Status    HISTORY OF PRESENT ILLNESS: Kathy Guzman  is a 81 y.o. female with a known history of essential hypertension, morbid obesity, chronic indwelling catheter due to urinary retention who presents to the hospital with confusion. Apparently patient was seen by her daughters in nursing facility earlier today. They found her to be confused and they were told that patient was initiated on Norflex, so they waited for a few hours, with hopes that that Norflex will go out from the system. However, since patient did not improve or they decided to bring her to the emergency room for further evaluation and therapy. Patient looked red, somewhat swollen in her face and she has some slurring of speech that she was talking out of her head.  Here in emergency room, she is able to answer some questions and able to follow simple commands. Her labs were remarkable for hyperkalemia, urinalysis was remarkable for pyuria. Due to concerns of urinary tract infection and delirium related to UTI. Hospitalist services were contacted for admission. Patient complains of pain all over her body  PAST MEDICAL HISTORY:   Past Medical History  Diagnosis Date  . CHF (congestive heart failure) ([place])   . Hypertension   . Hyperlipidemia   . COPD (chronic obstructive pulmonary disease) ([place])   . GERD (gastroesophageal reflux disease)   . Hypothyroid   . Osteoarthritis   . Depression   . Anxiety   . Skin cancer     PAST SURGICAL HISTORY: Past Surgical History  Procedure Laterality Date  . Partial hysterectomy    . Cholecystectomy      SOCIAL HISTORY:  Social History  Substance Use Topics   . Smoking status: Former Smoker    Quit date: 03/18/1994  . Smokeless tobacco: Never Used  . Alcohol Use: No    FAMILY HISTORY: No early coronary artery disease or hypertension  DRUG ALLERGIES:  Allergies  Allergen Reactions  . Tramadol Itching    Review of Systems  Unable to perform ROS: medical condition    MEDICATIONS AT HOME:  Prior to Admission medications   Medication Sig Start Date End Date Taking? Authorizing Provider  acetaminophen (TYLENOL) 500 MG tablet Take 500 mg by mouth 3 (three) times daily as needed for mild pain.   Yes Historical Provider, MD  albuterol (PROVENTIL HFA;VENTOLIN HFA) 108 (90 BASE) MCG/ACT inhaler Inhale 2 puffs into the lungs every 4 (four) hours as needed for wheezing or shortness of breath.   Yes Historical Provider, MD  allopurinol (ZYLOPRIM) 100 MG tablet Take 100 mg by mouth daily.    Yes Historical Provider, MD  budesonide-formoterol (SYMBICORT) 160-4.5 MCG/ACT inhaler Inhale 2 puffs into the lungs 2 (two) times daily.   Yes Historical Provider, MD  calcium carbonate (TUMS) 500 MG chewable tablet Chew 1 tablet by mouth every 4 (four) hours as needed for indigestion or heartburn.   Yes Historical Provider, MD  clonazePAM (KLONOPIN) 0.5 MG tablet Take 0.5 mg by mouth at bedtime.    Yes Historical Provider, MD  cyanocobalamin 500 MCG tablet Take 500 mcg by mouth 2 (two) times daily.   Yes Historical Provider, MD  Emollient (  EUCERIN EX) Apply 1 application topically 2 (two) times daily.   Yes Historical Provider, MD  ferrous sulfate 325 (65 FE) MG tablet Take 325 mg by mouth 3 (three) times daily with meals.    Yes Historical Provider, MD  furosemide (LASIX) 20 MG tablet Take 20 mg by mouth daily.   Yes Historical Provider, MD  gabapentin (NEURONTIN) 300 MG capsule Take 300 mg by mouth 3 (three) times daily.   Yes Historical Provider, MD  guaiFENesin-dextromethorphan (ROBITUSSIN DM) 100-10 MG/5ML syrup Take 10 mLs by mouth every 4 (four) hours as  needed for cough.   Yes Historical Provider, MD  HYDROcodone-acetaminophen (NORCO/VICODIN) 5-325 MG tablet Take 1 tablet by mouth 2 (two) times daily.   Yes Historical Provider, MD  hydrOXYzine (ATARAX/VISTARIL) 25 MG tablet Take 25 mg by mouth 2 (two) times daily as needed for itching.    Yes Historical Provider, MD  levothyroxine (SYNTHROID, LEVOTHROID) 75 MCG tablet Take 75 mcg by mouth daily before breakfast.   Yes Historical Provider, MD  loratadine (CLARITIN) 10 MG tablet Take 10 mg by mouth daily.   Yes Historical Provider, MD  nystatin cream (MYCOSTATIN) Apply 1 application topically every 8 (eight) hours as needed (for yeast). Pt applies to all skin folds and groin.   Yes Historical Provider, MD  omeprazole (PRILOSEC) 20 MG capsule Take 20 mg by mouth daily.    Yes Historical Provider, MD  oxybutynin (DITROPAN) 5 MG tablet Take 5 mg by mouth every 8 (eight) hours as needed for bladder spasms.   Yes Historical Provider, MD  PARoxetine (PAXIL) 30 MG tablet Take 30 mg by mouth daily.   Yes Historical Provider, MD  Skin Protectants, Misc. (CALAZIME SKIN PROTECTANT EX) Apply 1 application topically 4 (four) times daily as needed (for rash). Pt applies to buttocks.   Yes Historical Provider, MD  tiotropium (SPIRIVA) 18 MCG inhalation capsule Place 18 mcg into inhaler and inhale daily.   Yes Historical Provider, MD  valsartan (DIOVAN) 160 MG tablet Take 160 mg by mouth daily.   Yes Historical Provider, MD  Vitamin D, Ergocalciferol, (DRISDOL) 50000 units CAPS capsule Take 50,000 Units by mouth every 30 (thirty) days.   Yes Historical Provider, MD      PHYSICAL EXAMINATION:   VITAL SIGNS: Blood pressure 173/68, pulse 100, temperature 98.9 F (37.2 C), temperature source Rectal, resp. rate 21, weight 159 kg (350 lb 8.5 oz), SpO2 91 %.  GENERAL:  81 y.o.-year-old patient lying in the bed in mild respiratory distress, wheezing from the distance, tachypneic.  EYES: Pupils equal, round, reactive to  light and accommodation. No scleral icterus. Extraocular muscles intact.  HEENT: Head atraumatic, normocephalic. Oropharynx and nasopharynx clear.  NECK:  Supple, no jugular venous distention. No thyroid enlargement, no tenderness.  LUNGS: Some diminished breath sounds bilaterally, scattered wheezing, no rales,rhonchi or crepitation. Intermittent use of accessory muscles of respiration, tachypneic but not not overt respiratory distress.  CARDIOVASCULAR: S1, S2 . Rhythm was regular. No murmurs, rubs, or gallops.  ABDOMEN: Soft, nontender, nondistended. Bowel sounds present. No organomegaly or mass.  EXTREMITIES: 1+ pedal edema, no cyanosis, or clubbing.  NEUROLOGIC: Cranial nerves II through XII are intact. Muscle strength  in all extremities. Difficult to assess due to patient's immobility, however, able to move her toes as well as fingers, symmetric. Sensation grossly intact. Gait not checked, patient is immobile.  PSYCHIATRIC: The patient is alert , intermittently confused, however, in able to answer some simple questions and follow simple commands.  SKIN: No obvious rash, lesion, or ulcer.   LABORATORY PANEL:   CBC  Recent Labs Lab 01/07/16 1833  WBC 10.3  HGB 10.8*  HCT 33.0*  PLT 240  MCV 91.9  MCH 30.2  MCHC 32.8  RDW 14.6*  LYMPHSABS 2.1  MONOABS 1.2*  EOSABS 1.5*  BASOSABS 0.1   ------------------------------------------------------------------------------------------------------------------  Chemistries   Recent Labs Lab 01/07/16 1833  NA 134*  K 5.3*  CL 99*  CO2 26  GLUCOSE 91  BUN 21*  CREATININE 0.94  CALCIUM 9.1   ------------------------------------------------------------------------------------------------------------------  Cardiac Enzymes No results for input(s): TROPONINI in the last 168 hours. ------------------------------------------------------------------------------------------------------------------  RADIOLOGY: Dg Chest Portable 1  View  01/07/2016  CLINICAL DATA:  Altered mental status. No reported shortness of breath. EXAM: PORTABLE CHEST 1 VIEW COMPARISON:  06/10/2015 FINDINGS: Mild enlargement of the cardiopericardial silhouette, stable. No mediastinal or hilar masses or convincing adenopathy. Lungs are clear.  No pleural effusion or pneumothorax. Bony thorax is demineralized. Right humeral head appears subluxed inferiorly in relation to the glenoid, unchanged from the prior chest radiograph. IMPRESSION: No acute cardiopulmonary disease. Electronically Signed   By: Lajean Manes M.D.   On: 01/07/2016 19:01    EKG: Orders placed or performed during the hospital encounter of 06/10/15  . ED EKG  . ED EKG  . EKG    IMPRESSION AND PLAN:  Principal Problem:   Delirium Active Problems:   Acute cystitis without hematuria   Hyperkalemia   Body aches   Hyponatremia   Anemia   Morbid obesity (HCC)  #1. Delirium, likely due to urinary tract infection, supportive therapy. Follow clinically. Get a head CT and get brain MRI if needed, if symptoms do not improve with acute cystitis therapy  #2. Acute cystitis with hematuria, get urinary cultures, initiate patient on Zosyn due to complicated urinary tract infection as patient has indwelling Foley catheter. #3. Hyperkalemia, give Kayexalate, check potassium level tomorrow morning. #4. Dyspnea with wheezing, likely infection related, rule out acidosis, get ABGs and lactic acid level, continue patient on DuoNeb nebs, follow clinically #5, body aches, could be related to infection, hyperkalemia, acidosis, supportive therapy. #6 Anemia. Get Hemoccult. #7 Morbid obesity, supportive therapy, Get ABGs to rule out CO2 retention   All the records are reviewed and case discussed with ED provider. Management plans discussed with the patient, 2 daughters and  they are in agreement.  CODE STATUS: Code Status History    This patient does not have a recorded code status. Please follow  your organizational policy for patients in this situation.       TOTAL TIME TAKING CARE OF THIS PATIENT: 55  minutes.    Theodoro Grist M.D on 01/07/2016 at 9:32 PM  Between 7am to 6pm - Pager - [phone] After 6pm go to [email or web address] - password [email or web address]  [facility]  Office  [phone]  CC: Primary care physician; Margarita Rana, MD

## 2016-01-07 NOTE — ED Provider Notes (Signed)
[facility] Emergency Department Provider Note   ____________________________________________  Time seen: on ems arrival  I have reviewed the triage vital signs and the nursing notes.   HISTORY  Chief Complaint Altered Mental Status   History limited by: Altered Mental Status, some history obtained from EMS   HPI Kathy Guzman is a 81 y.o. female with history of morbid obesity, CHF, COPD, multiple recent infections who presents from [facility] today because of family concern for altered mental status, pain. Appears the patient is been suffering from full body pain for the past few days at [facility]. She has been given Norco without significant relief. Family also feels like the patient was slurring some of her words today. EMS states that the patient is been on multiple antibiotics recently for both urinary tract infections and pneumonias. Patient herself states simply that she is in pain. She does not state that she has any change in defecation. No specific abdominal pain. No fevers.    Past Medical History  Diagnosis Date  . CHF (congestive heart failure) ([place])   . Hypertension   . Hyperlipidemia   . COPD (chronic obstructive pulmonary disease) ([place])   . GERD (gastroesophageal reflux disease)   . Hypothyroid   . Osteoarthritis   . Depression   . Anxiety   . Skin cancer     Patient Active Problem List   Diagnosis Date Noted  . Chronic diastolic heart failure ([place]) 03/19/2015  . COPD (chronic obstructive pulmonary disease) ([place]) 03/19/2015    Past Surgical History  Procedure Laterality Date  . Partial hysterectomy    . Cholecystectomy      Current Outpatient Rx  Name  Route  Sig  Dispense  Refill  . acetaminophen (TYLENOL) 325 MG tablet   Oral   Take 650 mg by mouth 3 (three) times daily as needed for moderate pain.         Marland Kitchen albuterol (PROVENTIL HFA;VENTOLIN HFA) 108 (90 BASE) MCG/ACT inhaler   Inhalation   Inhale 2 puffs  into the lungs every 4 (four) hours as needed for wheezing or shortness of breath.         . allopurinol (ZYLOPRIM) 100 MG tablet   Oral   Take 100 mg by mouth daily.          . benzonatate (TESSALON PERLES) 100 MG capsule   Oral   Take 1 capsule (100 mg total) by mouth 3 (three) times daily as needed for cough.   12 capsule   0   . budesonide-formoterol (SYMBICORT) 160-4.5 MCG/ACT inhaler   Inhalation   Inhale 2 puffs into the lungs 2 (two) times daily.         . calcium carbonate (TUMS) 500 MG chewable tablet   Oral   Chew 1 tablet by mouth every 4 (four) hours as needed for indigestion or heartburn.         . clonazePAM (KLONOPIN) 0.5 MG tablet   Oral   Take 0.5 mg by mouth 2 (two) times daily.         . Cyanocobalamin (VITAMIN B 12 PO)   Oral   Take 500 mcg by mouth 2 times daily at 12 noon and 4 pm.         . ferrous sulfate 325 (65 FE) MG tablet   Oral   Take 325 mg by mouth 2 (two) times daily with a meal.         . furosemide (LASIX) 20  MG tablet   Oral   Take 20 mg by mouth daily.         Marland Kitchen gabapentin (NEURONTIN) 300 MG capsule   Oral   Take 300 mg by mouth 3 (three) times daily.         Marland Kitchen guaiFENesin (MUCINEX) 600 MG 12 hr tablet   Oral   Take 600 mg by mouth 2 (two) times daily.         . hydrOXYzine (ATARAX/VISTARIL) 25 MG tablet   Oral   Take 25 mg by mouth 2 (two) times daily as needed for itching.          Marland Kitchen levofloxacin (LEVAQUIN) 750 MG tablet   Oral   Take 1 tablet (750 mg total) by mouth daily.   5 tablet   0   . levothyroxine (SYNTHROID, LEVOTHROID) 75 MCG tablet   Oral   Take 75 mcg by mouth daily before breakfast.         . loratadine (CLARITIN) 10 MG tablet   Oral   Take 10 mg by mouth daily.         Marland Kitchen nystatin cream (MYCOSTATIN)   Topical   Apply 1 application topically every 6 (six) hours as needed (for yeast). Apply to all skin folds and groin.         Marland Kitchen omeprazole (PRILOSEC) 20 MG capsule    Oral   Take 20 mg by mouth daily.          Marland Kitchen PARoxetine (PAXIL) 30 MG tablet   Oral   Take 30 mg by mouth daily.         Marland Kitchen tiotropium (SPIRIVA) 18 MCG inhalation capsule   Inhalation   Place 18 mcg into inhaler and inhale daily.         . valsartan (DIOVAN) 160 MG tablet   Oral   Take 160 mg by mouth daily.           Allergies Tramadol  No family history on file.  Social History Social History  Substance Use Topics  . Smoking status: Former Smoker    Quit date: 03/18/1994  . Smokeless tobacco: Never Used  . Alcohol Use: No    Review of Systems  Constitutional: Negative for fever. Cardiovascular: Negative for chest pain. Respiratory: Positive for shortness of breath. Gastrointestinal: Negative for abdominal pain, vomiting and diarrhea. Neurological: Negative for headaches, focal weakness or numbness.  10-point ROS otherwise negative.  ____________________________________________   PHYSICAL EXAM:  VITAL SIGNS: ED Triage Vitals  Enc Vitals Group     BP 01/07/16 1824 157/46 mmHg     Pulse Rate 01/07/16 1824 93     Resp 01/07/16 1824 21     Temp 01/07/16 1824 99.5 F (37.5 C)     Temp Source 01/07/16 1824 Oral     SpO2 01/07/16 1824 100 %     Weight 01/07/16 1824 350 lb 8.5 oz (159 kg)     Height --      Head Cir --      Peak Flow --      Pain Score 01/07/16 1825 5   Constitutional: Alert and oriented. Morbidly obese, chronically ill appearing.  Eyes: Conjunctivae are normal. PERRL. Normal extraocular movements. ENT   Head: Normocephalic and atraumatic.   Nose: No congestion/rhinnorhea.   Mouth/Throat: Mucous membranes are moist.   Neck: No stridor. Hematological/Lymphatic/Immunilogical: No cervical lymphadenopathy. Cardiovascular: Normal rate, regular rhythm.  No murmurs, rubs, or gallops. Respiratory: Normal respiratory effort without  tachypnea nor retractions. Breath sounds are clear and equal bilaterally. No  wheezes/rales/rhonchi. Gastrointestinal: Soft and nontender. No distention. There is no CVA tenderness. Genitourinary: Deferred Musculoskeletal: Normal range of motion in all extremities. No joint effusions.  No lower extremity tenderness nor edema. Neurologic:  Normal speech and language. No gross focal neurologic deficits are appreciated.  Skin:  Skin is warm, dry and intact. No rash noted. Psychiatric: Mood and affect are normal. Speech and behavior are normal. Patient exhibits appropriate insight and judgment.  ____________________________________________    LABS (pertinent positives/negatives)  Labs Reviewed  CBC WITH DIFFERENTIAL/PLATELET - Abnormal; Notable for the following:    RBC 3.59 (*)    Hemoglobin 10.8 (*)    HCT 33.0 (*)    RDW 14.6 (*)    Monocytes Absolute 1.2 (*)    Eosinophils Absolute 1.5 (*)    All other components within normal limits  BASIC METABOLIC PANEL - Abnormal; Notable for the following:    Sodium 134 (*)    Potassium 5.3 (*)    Chloride 99 (*)    BUN 21 (*)    GFR calc non Af Amer 57 (*)    All other components within normal limits  URINALYSIS COMPLETEWITH MICROSCOPIC ([facility] ONLY) - Abnormal; Notable for the following:    Color, Urine YELLOW (*)    APPearance HAZY (*)    Hgb urine dipstick 1+ (*)    Nitrite POSITIVE (*)    Leukocytes, UA 2+ (*)    Bacteria, UA FEW (*)    Squamous Epithelial / LPF 0-5 (*)    All other components within normal limits  URINE CULTURE  LACTIC ACID, PLASMA  LACTIC ACID, PLASMA  BLOOD GAS, ARTERIAL  INFLUENZA PANEL BY PCR (TYPE A & B, H1N1)     ____________________________________________   EKG  I, Nance Pear, attending physician, personally viewed and interpreted this EKG  EKG Time: 1822 Rate: 95 Rhythm: normal sinus rhythm Axis: left axis deviation Intervals: qtc 462 QRS: nonspecific IVCD ST changes: no st elevation Impression: abnormal ekg   ____________________________________________     RADIOLOGY  CXR IMPRESSION: No acute cardiopulmonary disease.   ____________________________________________   PROCEDURES  Procedure(s) performed: None  Critical Care performed: No  ____________________________________________   INITIAL IMPRESSION / ASSESSMENT AND PLAN / ED COURSE  Pertinent labs & imaging results that were available during my care of the patient were reviewed by me and considered in my medical decision making (see chart for details).  Patient presented to the emergency department today brought in by EMS because of concerns for altered mental status. Workup is concerning for urinary tract infection. Patient was given a dose of antibiotics here in the emergency department. Will plan on admission to the hospitalist service for further workup and evaluation given altered mental status and infection.  ____________________________________________   FINAL CLINICAL IMPRESSION(S) / ED DIAGNOSES  Final diagnoses:  Altered mental status, unspecified altered mental status type  UTI (lower urinary tract infection)     Nance Pear, MD 01/07/16 2206

## 2016-01-07 NOTE — Progress Notes (Signed)
ANTIBIOTIC CONSULT NOTE - INITIAL  Pharmacy Consult for Zosyn  Indication: UTI  Allergies  Allergen Reactions  . Tramadol Itching    Patient Measurements: Weight: (!) 350 lb 8.5 oz (159 kg) Adjusted Body Weight:   Vital Signs: Temp: 98.9 F (37.2 C) (03/07 1846) Temp Source: Rectal (03/07 1846) BP: 173/68 mmHg (03/07 2032) Pulse Rate: 100 (03/07 2032) Intake/Output from previous day:   Intake/Output from this shift:    Labs:  Recent Labs  01/07/16 1833  WBC 10.3  HGB 10.8*  PLT 240  CREATININE 0.94   CrCl cannot be calculated (Unknown ideal weight.). No results for input(s): VANCOTROUGH, VANCOPEAK, VANCORANDOM, GENTTROUGH, GENTPEAK, GENTRANDOM, TOBRATROUGH, TOBRAPEAK, TOBRARND, AMIKACINPEAK, AMIKACINTROU, AMIKACIN in the last 72 hours.   Microbiology: No results found for this or any previous visit (from the past 720 hour(s)).  Medical History: Past Medical History  Diagnosis Date  . CHF (congestive heart failure) ([place])   . Hypertension   . Hyperlipidemia   . COPD (chronic obstructive pulmonary disease) ([place])   . GERD (gastroesophageal reflux disease)   . Hypothyroid   . Osteoarthritis   . Depression   . Anxiety   . Skin cancer     Medications:  Scheduled:  . sodium polystyrene  30 g Oral Once   Assessment: Pharmacy consulted to dosy zosyn in this 81 year old female admitted with complicated UTI.  TBW = 159 kg  SrCr = 0.94 mg/dL  Goal of Therapy:  resolution of infection  Plan:  Expected duration 7 days with resolution of temperature and/or normalization of WBC   Will start Zosyn 4.5 gm IV Q8H EI.   Tashawnda Bleiler D 01/07/2016,9:39 PM

## 2016-01-07 NOTE — ED Notes (Addendum)
Pt bib EMS [facility] w/ c/o alt mental status.  Per EMS, pts family called out.  Pt 100% 2 L [place].  A/Ox 4.  Pt denies SOB any more than normal.  Pts family sts that pt is normally more alert and "bushy tailed"

## 2016-01-08 LAB — CBC
HCT: 32.1 % — ABNORMAL LOW (ref 35.0–47.0)
HCT: 31.7 % — ABNORMAL LOW (ref 35.0–47.0)
HEMOGLOBIN: 10.7 g/dL — AB (ref 12.0–16.0)
Hemoglobin: 10.6 g/dL — ABNORMAL LOW (ref 12.0–16.0)
MCH: 30.8 pg (ref 26.0–34.0)
MCH: 30.4 pg (ref 26.0–34.0)
MCHC: 33.3 g/dL (ref 32.0–36.0)
MCHC: 33.6 g/dL (ref 32.0–36.0)
MCV: 92.6 fL (ref 80.0–100.0)
MCV: 90.7 fL (ref 80.0–100.0)
PLATELETS: 232 10*3/uL (ref 150–440)
PLATELETS: 235 10*3/uL (ref 150–440)
RBC: 3.47 MIL/uL — AB (ref 3.80–5.20)
RBC: 3.49 MIL/uL — AB (ref 3.80–5.20)
RDW: 14.4 % (ref 11.5–14.5)
RDW: 14.7 % — AB (ref 11.5–14.5)
WBC: 9.6 10*3/uL (ref 3.6–11.0)
WBC: 9.5 10*3/uL (ref 3.6–11.0)

## 2016-01-08 LAB — BASIC METABOLIC PANEL
Anion gap: 11 (ref 5–15)
BUN: 18 mg/dL (ref 6–20)
CALCIUM: 9 mg/dL (ref 8.9–10.3)
CO2: 26 mmol/L (ref 22–32)
CREATININE: 0.95 mg/dL (ref 0.44–1.00)
Chloride: 98 mmol/L — ABNORMAL LOW (ref 101–111)
GFR calc Af Amer: >60 mL/min (ref >60)
GFR calc non Af Amer: 56 mL/min — ABNORMAL LOW (ref >60)
Glucose, Bld: 96 mg/dL (ref 65–99)
Potassium: 4.3 mmol/L (ref 3.5–5.1)
SODIUM: 135 mmol/L (ref 135–145)

## 2016-01-08 LAB — INFLUENZA PANEL BY PCR (TYPE A & B)
H1N1 flu by pcr: NOT DETECTED
INFLAPCR: NEGATIVE
INFLBPCR: NEGATIVE

## 2016-01-08 LAB — MRSA PCR SCREENING: MRSA BY PCR: NEGATIVE

## 2016-01-08 LAB — CREATININE, SERUM
CREATININE: 0.87 mg/dL (ref 0.44–1.00)
GFR calc Af Amer: >60 mL/min (ref >60)

## 2016-01-08 MED ORDER — PREDNISONE 20 MG PO TABS
20.0000 mg | ORAL_TABLET | Freq: Every day | ORAL | Status: DC
  Administered 2016-01-09: 20 mg via ORAL
  Filled 2016-01-08: qty 1

## 2016-01-08 MED ORDER — DEXTROSE 5 % IV SOLN
1.0000 g | Freq: Every day | INTRAVENOUS | Status: DC
  Administered 2016-01-08 – 2016-01-09 (×2): 1 g via INTRAVENOUS
  Filled 2016-01-08 (×3): qty 10

## 2016-01-08 MED ORDER — IPRATROPIUM-ALBUTEROL 0.5-2.5 (3) MG/3ML IN SOLN
3.0000 mL | Freq: Four times a day (QID) | RESPIRATORY_TRACT | Status: DC
  Administered 2016-01-08 – 2016-01-13 (×18): 3 mL via RESPIRATORY_TRACT
  Filled 2016-01-08 (×20): qty 3

## 2016-01-08 MED ORDER — BUDESONIDE 0.25 MG/2ML IN SUSP
0.2500 mg | Freq: Two times a day (BID) | RESPIRATORY_TRACT | Status: DC
  Administered 2016-01-08 – 2016-01-13 (×10): 0.25 mg via RESPIRATORY_TRACT
  Filled 2016-01-08 (×10): qty 2

## 2016-01-08 NOTE — Progress Notes (Signed)
Patient ID: Kathy Guzman, female   DOB: 30-Apr-1935, 81 y.o.   MRN: [id number] [facility] PROGRESS NOTE  Kathy Guzman [id number] DOB: [birth year] DOA: 01/07/2016 PCP: Margarita Rana, MD  HPI/Subjective: Patient able to answer some questions appropriately. But then as per daughter still talking out of her head. Patient with audible wheezing. Patient has a chronic urinary catheter and does not walk.  Objective: Filed Vitals:   01/08/16 0825 01/08/16 1530  BP: 154/62 142/54  Pulse: 95 99  Temp: 98.5 F (36.9 C) 98 F (36.7 C)  Resp: 18     Filed Weights   01/07/16 1824 01/08/16 0016  Weight: 159 kg (350 lb 8.5 oz) 154.586 kg (340 lb 12.8 oz)    ROS: Review of Systems  Constitutional: Negative for fever and chills.  Eyes: Negative for blurred vision.  Respiratory: Positive for cough, shortness of breath and wheezing.   Cardiovascular: Negative for chest pain.  Gastrointestinal: Negative for nausea, vomiting, abdominal pain, diarrhea and constipation.  Genitourinary: Negative for dysuria.  Musculoskeletal: Negative for joint pain.  Neurological: Negative for dizziness and headaches.   Exam: Physical Exam  HENT:  Nose: No mucosal edema.  Mouth/Throat: No oropharyngeal exudate or posterior oropharyngeal edema.  Eyes: Conjunctivae, EOM and lids are normal. Pupils are equal, round, and reactive to light.  Neck: No JVD present. Carotid bruit is not present. No edema present. No thyroid mass and no thyromegaly present.  Cardiovascular: S1 normal and S2 normal.  Exam reveals no gallop.   No murmur heard. Pulses:      Dorsalis pedis pulses are 2+ on the right side, and 2+ on the left side.  Respiratory: No respiratory distress. She has decreased breath sounds in the right middle field, the right lower field, the left middle field and the left lower field. She has wheezes in the right middle field, the right lower field, the left middle field and the left lower  field. She has no rhonchi. She has no rales.  GI: Soft. Bowel sounds are normal. There is no tenderness.  Musculoskeletal:       Right ankle: She exhibits swelling.       Left ankle: She exhibits swelling.  Lymphadenopathy:    She has no cervical adenopathy.  Neurological: She is alert. No cranial nerve deficit.  Skin: Skin is warm. No rash noted. Nails show no clubbing.  Psychiatric: She has a normal mood and affect.      Data Reviewed: Basic Metabolic Panel:  Recent Labs Lab 01/07/16 1833 01/08/16 0056 01/08/16 0644  NA 134*  --  135  K 5.3*  --  4.3  CL 99*  --  98*  CO2 26  --  26  GLUCOSE 91  --  96  BUN 21*  --  18  CREATININE 0.94 0.87 0.95  CALCIUM 9.1  --  9.0   CBC:  Recent Labs Lab 01/07/16 1833 01/08/16 0056 01/08/16 0644  WBC 10.3 9.6 9.5  NEUTROABS 5.4  --   --   HGB 10.8* 10.7* 10.6*  HCT 33.0* 32.1* 31.7*  MCV 91.9 92.6 90.7  PLT 240 232 235    Recent Results (from the past 240 hour(s))  MRSA PCR Screening     Status: None   Collection Time: 01/07/16 11:25 PM  Result Value Ref Range Status   MRSA by PCR NEGATIVE NEGATIVE Final    Comment:        The GeneXpert MRSA Assay (FDA approved for NASAL  specimens only), is one component of a comprehensive MRSA colonization surveillance program. It is not intended to diagnose MRSA infection nor to guide or monitor treatment for MRSA infections.      Studies: Ct Head Wo Contrast  01/07/2016  CLINICAL DATA:  Altered mental status EXAM: CT HEAD WITHOUT CONTRAST TECHNIQUE: Contiguous axial images were obtained from the base of the skull through the vertex without intravenous contrast. COMPARISON:  02/13/2009 FINDINGS: Motion degraded images. No evidence of parenchymal hemorrhage or extra-axial fluid collection. No mass lesion, mass effect, or midline shift. No CT evidence of acute infarction. Subcortical white matter and periventricular small vessel ischemic changes. Global cortical atrophy.  No  ventriculomegaly. The visualized paranasal sinuses are essentially clear. The mastoid air cells are unopacified. No evidence of calvarial fracture. IMPRESSION: No evidence of acute intracranial abnormality. Mild atrophy with small vessel ischemic changes. Electronically Signed   By: Julian Hy M.D.   On: 01/07/2016 22:03   Dg Chest Portable 1 View  01/07/2016  CLINICAL DATA:  Altered mental status. No reported shortness of breath. EXAM: PORTABLE CHEST 1 VIEW COMPARISON:  06/10/2015 FINDINGS: Mild enlargement of the cardiopericardial silhouette, stable. No mediastinal or hilar masses or convincing adenopathy. Lungs are clear.  No pleural effusion or pneumothorax. Bony thorax is demineralized. Right humeral head appears subluxed inferiorly in relation to the glenoid, unchanged from the prior chest radiograph. IMPRESSION: No acute cardiopulmonary disease. Electronically Signed   By: Lajean Manes M.D.   On: 01/07/2016 19:01    Scheduled Meds: . allopurinol  100 mg Oral Daily  . budesonide (PULMICORT) nebulizer solution  0.25 mg Nebulization BID  . cefTRIAXone (ROCEPHIN)  IV  1 g Intravenous QPC supper  . clonazePAM  0.25 mg Oral QHS  . cyanocobalamin  500 mcg Oral BID  . enoxaparin (LOVENOX) injection  40 mg Subcutaneous BID  . ferrous sulfate  325 mg Oral TID WC  . furosemide  20 mg Oral Daily  . gabapentin  300 mg Oral TID  . guaiFENesin  600 mg Oral BID  . ipratropium-albuterol  3 mL Nebulization Q6H  . irbesartan  150 mg Oral Daily  . levothyroxine  75 mcg Oral QAC breakfast  . loratadine  10 mg Oral Daily  . pantoprazole  40 mg Oral Daily  . PARoxetine  30 mg Oral Daily  . [START ON 01/09/2016] predniSONE  20 mg Oral Q breakfast  . sodium chloride flush  3 mL Intravenous Q12H    Assessment/Plan:  1. Acute encephalopathy. Daughter is still concerned that the patient is talking out of her head. The patient was recently started on Norco. This has been discontinued. Could be infection  related but unclear at this point. 2. Audible wheeze. Could be bronchitis. Start budesonide nebulizer and DuoNeb nebulizers and prednisone low dose. Empiric Rocephin 3. Chronic Foley catheter. Unless the patient is septic and do not like to treat catheter-based infections. The patient did have a low-grade fever and altered mental status. I am unsure if I need to treat this or not. Continue to monitor until cultures are back 4. Obesity 5. History of congestive heart failure- BNP in normal range 6. Essential hypertension continue usual medications 7. Hypothyroidism unspecified continue levothyroxine 8. Anxiety depression- continue psychiatric medications 9. Gastroesophageal reflux disease without esophagitis on Protonix  Code Status:     Code Status Orders        Start     Ordered   01/07/16 2226  Full code   Continuous  01/07/16 2225    Code Status History    Date Active Date Inactive Code Status Order ID Comments User Context   This patient has a current code status but no historical code status.     Family Communication: Daughter at the bedside Disposition Plan: Back to facility soon  Antibiotics:  Rocephin  Time spent: 25 minutes  Loletha Grayer  [facility]

## 2016-01-08 NOTE — Care Management (Signed)
Patient is from [facility] with Chronic Foley.

## 2016-01-08 NOTE — Progress Notes (Signed)
Barkley Boards, NP from [facility] called to notified that pt was started on Norco 5mg  2X, Wants [place] to notify the MD. MD Wieting notified at the nurses station. MD also given [employer] number [phone].

## 2016-01-08 NOTE — NC FL2 (Signed)
[place] LEVEL OF CARE SCREENING TOOL     IDENTIFICATION  Patient Name: Kathy Guzman Birthdate: [birth year] Sex: female Admission Date (Current Location): 01/07/2016  [facility] and [place] Number:  Selena Lesser  ([id number] [facility]) Facility and Address:  [facility], [address], [place], [place] [address]      Provider Number: [id number]  Attending Physician Name and Address:  Loletha Grayer, MD  Relative Name and Phone Number:       Current Level of Care: Hospital Recommended Level of Care: [place] Prior Approval Number:    Date Approved/Denied:   PASRR Number:  ([id number])  Discharge Plan: SNF    Current Diagnoses: Patient Active Problem List   Diagnosis Date Noted  . Delirium 01/07/2016  . Acute cystitis without hematuria 01/07/2016  . Hyperkalemia 01/07/2016  . Hyponatremia 01/07/2016  . Anemia 01/07/2016  . Morbid obesity ([place]) 01/07/2016  . Body aches 01/07/2016  . Chronic diastolic heart failure ([place]) 03/19/2015  . COPD (chronic obstructive pulmonary disease) ([place]) 03/19/2015    Orientation RESPIRATION BLADDER Height & Weight     Time, Situation, Place, Self  O2 (Nasal Cannula 2 (L/min) ) Incontinent Weight: (!) 340 lb 12.8 oz (154.586 kg) Height:  5\' 2"  (157.5 cm)  BEHAVIORAL SYMPTOMS/MOOD NEUROLOGICAL BOWEL NUTRITION STATUS   (None)  (None) Incontinent Diet (Carb Modified )  AMBULATORY STATUS COMMUNICATION OF NEEDS Skin   Limited Assist Verbally Normal                       Personal Care Assistance Level of Assistance  Bathing, Feeding, Dressing Bathing Assistance: Limited assistance Feeding assistance: Limited assistance Dressing Assistance: Limited assistance     Functional Limitations Info  Sight, Hearing, Speech Sight Info: Adequate Hearing Info: Adequate Speech Info: Adequate    SPECIAL CARE FACTORS FREQUENCY                       Contractures      Additional  Factors Info  Code Status, Allergies Code Status Info:  (Full Code) Allergies Info:  (Tramadol)           Current Medications (01/08/2016):  This is the current hospital active medication list Current Facility-Administered Medications  Medication Dose Route Frequency Provider Last Rate Last Dose  . acetaminophen (TYLENOL) tablet 500 mg  500 mg Oral TID PRN Theodoro Grist, MD      . albuterol (PROVENTIL) (2.5 MG/3ML) 0.083% nebulizer solution 3 mL  3 mL Inhalation Q4H PRN Theodoro Grist, MD      . allopurinol (ZYLOPRIM) tablet 100 mg  100 mg Oral Daily Theodoro Grist, MD   100 mg at 01/08/16 1024  . budesonide (PULMICORT) nebulizer solution 0.25 mg  0.25 mg Nebulization BID Loletha Grayer, MD   0.25 mg at 01/08/16 1015  . calcium carbonate (TUMS - dosed in mg elemental calcium) chewable tablet 200 mg of elemental calcium  1 tablet Oral Q4H PRN Theodoro Grist, MD      . clonazePAM (KLONOPIN) tablet 0.25 mg  0.25 mg Oral QHS Theodoro Grist, MD   0.25 mg at 01/07/16 2347  . cyanocobalamin tablet 500 mcg  500 mcg Oral BID Theodoro Grist, MD   500 mcg at 01/08/16 1024  . enoxaparin (LOVENOX) injection 40 mg  40 mg Subcutaneous BID Theodoro Grist, MD   40 mg at 01/08/16 1025  . ferrous sulfate tablet 325 mg  325 mg Oral TID WC Theodoro Grist, MD  325 mg at 01/08/16 1259  . furosemide (LASIX) tablet 20 mg  20 mg Oral Daily Theodoro Grist, MD   20 mg at 01/08/16 1024  . gabapentin (NEURONTIN) capsule 300 mg  300 mg Oral TID Theodoro Grist, MD   300 mg at 01/08/16 1025  . guaiFENesin (MUCINEX) 12 hr tablet 600 mg  600 mg Oral BID Theodoro Grist, MD   600 mg at 01/08/16 1024  . guaiFENesin-dextromethorphan (ROBITUSSIN DM) 100-10 MG/5ML syrup 10 mL  10 mL Oral Q4H PRN Theodoro Grist, MD      . hydrOXYzine (ATARAX/VISTARIL) tablet 25 mg  25 mg Oral BID PRN Theodoro Grist, MD      . ipratropium-albuterol (DUONEB) 0.5-2.5 (3) MG/3ML nebulizer solution 3 mL  3 mL Nebulization Q6H Loletha Grayer, MD   3 mL at 01/08/16  1015  . irbesartan (AVAPRO) tablet 150 mg  150 mg Oral Daily Theodoro Grist, MD   150 mg at 01/08/16 1024  . levothyroxine (SYNTHROID, LEVOTHROID) tablet 75 mcg  75 mcg Oral QAC breakfast Theodoro Grist, MD   75 mcg at 01/08/16 1026  . loratadine (CLARITIN) tablet 10 mg  10 mg Oral Daily Theodoro Grist, MD   10 mg at 01/08/16 1024  . nystatin cream (MYCOSTATIN) 1 application  1 application Topical [id number] PRN Theodoro Grist, MD      . ondansetron (ZOFRAN) tablet 4 mg  4 mg Oral Q6H PRN Theodoro Grist, MD       Or  . ondansetron (ZOFRAN) injection 4 mg  4 mg Intravenous Q6H PRN Theodoro Grist, MD      . oxybutynin (DITROPAN) tablet 5 mg  5 mg Oral Q8H PRN Theodoro Grist, MD      . pantoprazole (PROTONIX) EC tablet 40 mg  40 mg Oral Daily Theodoro Grist, MD   40 mg at 01/08/16 1026  . PARoxetine (PAXIL) tablet 30 mg  30 mg Oral Daily Theodoro Grist, MD   30 mg at 01/08/16 1024  . piperacillin-tazobactam (ZOSYN) IVPB 4.5 g  4.5 g Intravenous 3 times per day Theodoro Grist, MD   4.5 g at 01/08/16 [id number]  . [START ON 01/09/2016] predniSONE (DELTASONE) tablet 20 mg  20 mg Oral Q breakfast Loletha Grayer, MD      . sodium chloride flush (NS) 0.9 % injection 3 mL  3 mL Intravenous Q12H Theodoro Grist, MD   3 mL at 01/07/16 2355     Discharge Medications: Please see discharge summary for a list of discharge medications.  Relevant Imaging Results:  Relevant Lab Results:   Additional Information  (SSN [id number])  Lorenso Quarry Shreya Lacasse, LCSW

## 2016-01-08 NOTE — Progress Notes (Signed)
Per order current catheter removed, and 14 G catheter inserted per order. Urine return noted, stat lock placed on right leg.

## 2016-01-08 NOTE — Clinical Social Work Note (Signed)
Clinical Social Work Assessment  Patient Details  Name: Kathy Guzman MRN: [id number] Date of Birth: [birth year]  Date of referral:  01/08/16               Reason for consult:  Discharge Planning                Permission sought to share information with:  Family Supports Permission granted to share information::  Yes, Verbal Permission Granted  Name::        Agency::     Relationship::   Marcie Bal and [name]- Daughters)  Sport and exercise psychologist Information:     Housing/Transportation Living arrangements for the past 2 months:  [employer] ) Source of Information:  Patient, Adult Children Marcie Bal and [name]- Daughters) Patient Interpreter Needed:  None Criminal Activity/Legal Involvement Pertinent to Current Situation/Hospitalization:  No - Comment as needed Significant Relationships:  Adult Children Lives with:  Facility Resident Do you feel safe going back to the place where you live?  Yes Need for family participation in patient care:  Yes (Comment) Marcie Bal and [name]- Daughters)  Care giving concerns:  Patient is from [facility].    Social Worker assessment / plan:  CSW was consulted by Chief Strategy Officer that patient is from [facility]. CSW met with patient and her daughter Marcie Bal [phone]) at bedside. Per Marcie Bal patient is from [facility]. She reports that patient has been a resident for about a 1 year. She reports that patient does not walk and is mostly bed bound. She reports that patient does not get up "unless she's going to her appointments". She reports that patient is chronically on O2. Requested EMS to transport patient at discharge. Requested CSW speak to her sister [name] "becasue she knows more about ma'ma than I". Kathy's cell number is [phone]  CSW contacted [facility]- Admissions Coordinator at [facility]. He reports that patient is a LTC resident and has been there for about a year. He reports that patient can return at  discharge.  FL2/ PASRR completed and faxed to [facility]. CSW will continue to follow and assist.   Employment status:  Retired Forensic scientist:  Medicare, Medicaid In [place] PT Recommendations:  Not assessed at this time [place] / Referral to community resources:  [place]  Patient/Family's Response to care:  Patient and her family are in agreement that she will return to [facility] at discharge.   Patient/Family's Understanding of and Emotional Response to Diagnosis, Current Treatment, and Prognosis:  Patient and her daughter understand CSW's role and are appreciative of her assistance.   Emotional Assessment Appearance:  Appears stated age Attitude/Demeanor/Rapport:   (None) Affect (typically observed):  Accepting, Calm, Pleasant Orientation:  Oriented to Self, Oriented to Place, Oriented to  Time, Oriented to Situation Alcohol / Substance use:  Not Applicable Psych involvement (Current and /or in the community):  No (Comment)  Discharge Needs  Concerns to be addressed:  Discharge Planning Concerns Readmission within the last 30 days:  No Current discharge risk:  Chronically ill Barriers to Discharge:  Continued Medical Work up   [employer], LCSW 01/08/2016, 3:44 PM

## 2016-01-08 NOTE — Evaluation (Signed)
Physical Therapy Evaluation Patient Details Name: Kathy Guzman MRN: [id number] DOB: [birth year] Today's Date: 01/08/2016   History of Present Illness  Pt admitted for delirium with complaints of AMS. Pt with history of HTN, morbid obesity, CHF, and COPD. Pt now with acute cystitis with complaints of hematuria. Pt currently from [facility] and has chronic foley cath.   Clinical Impression  Pt is a pleasant 81 year old female, slightly confused to situation, who was admitted for AMS. Most of history obtained from family as pt is slightly confused. Pt takes +2 assist for limited bed mobility and has SOB symptoms with exertion. Pt is on chronic O2 with sats decreasing with mobility. At baseline, pt stays in bed with lift equipment used for all mobility. Pt appears to be at physical baseline, however does report weakness in B UE limiting ability to self feed. Recommend OT consult and further needs addressed at LTC. Pt does not require any further PT needs at this time. Pt will be dc in house and does not require follow up. RN aware. Will dc current orders.      Follow Up Recommendations  (OT at LTC)    Equipment Recommendations       Recommendations for Other Services OT consult     Precautions / Restrictions Precautions Precautions: Fall Restrictions Weight Bearing Restrictions: No      Mobility  Bed Mobility Overal bed mobility: Needs Assistance Bed Mobility: Rolling           General bed mobility comments: attempted rolling, however pt with heavy SOB symptoms with O2 sats at 88% while on 2L of O2. Pt reports this is baseline level  Transfers                 General transfer comment: unsafe to perform at this time  Ambulation/Gait                Stairs            Wheelchair Mobility    Modified Rankin (Stroke Patients Only)       Balance                                             Pertinent Vitals/Pain Pain  Assessment: No/denies pain (reports SOB symptoms with limited exertion; rating 8/10)    Home Living Family/patient expects to be discharged to:: Skilled nursing facility                      Prior Function Level of Independence: Needs assistance         Comments: At baseline, pt is bed bound, uses lift for mobility. Pt reports she has not stood in 1 year     Hand Dominance        Extremity/Trunk Assessment   Upper Extremity Assessment: Generalized weakness (grossly 3+/5)           Lower Extremity Assessment: Generalized weakness (grossly 2/5)         Communication   Communication: No difficulties  Cognition Arousal/Alertness: Awake/alert Behavior During Therapy: WFL for tasks assessed/performed Overall Cognitive Status: Within Functional Limits for tasks assessed                      General Comments      Exercises        Assessment/Plan  PT Assessment Patent does not need any further PT services  PT Diagnosis Generalized weakness   PT Problem List    PT Treatment Interventions     PT Goals (Current goals can be found in the Care Plan section) Acute Rehab PT Goals Patient Stated Goal: to be able to feed herself PT Goal Formulation: With patient Time For Goal Achievement: 01/08/16 Potential to Achieve Goals: Good    Frequency     Barriers to discharge        Co-evaluation               End of Session Equipment Utilized During Treatment: Oxygen Activity Tolerance: Patient tolerated treatment well Patient left: in bed;with bed alarm set;with family/visitor present Nurse Communication: Mobility status;Need for lift equipment    Functional Assessment Tool Used: clinical judgement Functional Limitation: Mobility: Walking and moving around Mobility: Walking and Moving Around Current Status [phone]): 0 percent impaired, limited or restricted Mobility: Walking and Moving Around Goal Status [phone]): 0 percent impaired,  limited or restricted Mobility: Walking and Moving Around Discharge Status [phone]): 0 percent impaired, limited or restricted    Time: 1425-1438 PT Time Calculation (min) (ACUTE ONLY): 13 min   Charges:   PT Evaluation $PT Eval Moderate Complexity: 1 Procedure     PT G Codes:   PT G-Codes **NOT FOR INPATIENT CLASS** Functional Assessment Tool Used: clinical judgement Functional Limitation: Mobility: Walking and moving around Mobility: Walking and Moving Around Current Status [id number]): 0 percent impaired, limited or restricted Mobility: Walking and Moving Around Goal Status [id number]): 0 percent impaired, limited or restricted Mobility: Walking and Moving Around Discharge Status [phone]): 0 percent impaired, limited or restricted    Jazell Rosenau 01/08/2016, 4:19 PM  Greggory Stallion, PT, DPT [phone]

## 2016-01-09 ENCOUNTER — Observation Stay: Payer: Medicare Other

## 2016-01-09 LAB — URINE CULTURE

## 2016-01-09 MED ORDER — DOXYCYCLINE HYCLATE 100 MG IV SOLR
100.0000 mg | Freq: Two times a day (BID) | INTRAVENOUS | Status: DC
  Administered 2016-01-09 – 2016-01-10 (×3): 100 mg via INTRAVENOUS
  Filled 2016-01-09 (×4): qty 100

## 2016-01-09 MED ORDER — FUROSEMIDE 10 MG/ML IJ SOLN
20.0000 mg | Freq: Once | INTRAMUSCULAR | Status: AC
  Administered 2016-01-09: 20 mg via INTRAVENOUS
  Filled 2016-01-09: qty 2

## 2016-01-09 MED ORDER — QUETIAPINE FUMARATE 25 MG PO TABS
25.0000 mg | ORAL_TABLET | Freq: Every day | ORAL | Status: DC
  Administered 2016-01-09 – 2016-01-12 (×4): 25 mg via ORAL
  Filled 2016-01-09 (×4): qty 1

## 2016-01-09 NOTE — Progress Notes (Signed)
Patient ID: Kathy Guzman, female   DOB: 11/03/1934, 81 y.o.   MRN: [id number] [facility] Kathy Guzman  Kathy Guzman [id number] DOB: 12-Oct-1935 DOA: 01/07/2016 PCP: Margarita Rana, MD  HPI/Subjective: Patient still with audible wheezing. Patient still talking out of her head. Family still concerned about her mental status. Patient asking when she can get out of the hospital  Objective: Filed Vitals:   01/09/16 1142 01/09/16 1546  BP: 151/50 154/55  Pulse: 92 94  Temp: 98 F (36.7 C) 97.8 F (36.6 C)  Resp: 18 19    Filed Weights   01/07/16 1824 01/08/16 0016  Weight: 159 kg (350 lb 8.5 oz) 154.586 kg (340 lb 12.8 oz)    ROS: Review of Systems  Constitutional: Negative for fever and chills.  Eyes: Negative for blurred vision.  Respiratory: Positive for cough, shortness of breath and wheezing.   Cardiovascular: Negative for chest pain.  Gastrointestinal: Negative for nausea, vomiting, abdominal pain, diarrhea and constipation.  Genitourinary: Negative for dysuria.  Musculoskeletal: Negative for joint pain.  Neurological: Negative for dizziness and headaches.   Exam: Physical Exam  HENT:  Nose: No mucosal edema.  Mouth/Throat: No oropharyngeal exudate or posterior oropharyngeal edema.  Eyes: Conjunctivae, EOM and lids are normal. Pupils are equal, round, and reactive to light.  Neck: No JVD present. Carotid bruit is not present. No edema present. No thyroid mass and no thyromegaly present.  Cardiovascular: S1 normal and S2 normal.  Exam reveals no gallop.   No murmur heard. Pulses:      Dorsalis pedis pulses are 2+ on the right side, and 2+ on the left side.  Respiratory: No respiratory distress. She has decreased breath sounds in the right middle field, the right lower field, the left middle field and the left lower field. She has wheezes in the right middle field, the right lower field, the left middle field and the left lower field. She has no  rhonchi. She has no rales.  GI: Soft. Bowel sounds are normal. There is no tenderness.  Musculoskeletal:       Right ankle: She exhibits swelling.       Left ankle: She exhibits swelling.  Lymphadenopathy:    She has no cervical adenopathy.  Neurological: She is alert.  Skin: Skin is warm. No rash noted. Nails show no clubbing.  Psychiatric: Her affect is inappropriate.  Patient with inappropriate responses to some of my questions and what we were talking about.      Data Reviewed: Basic Metabolic Panel:  Recent Labs Lab 01/07/16 1833 01/08/16 0056 01/08/16 0644  NA 134*  --  135  K 5.3*  --  4.3  CL 99*  --  98*  CO2 26  --  26  GLUCOSE 91  --  96  BUN 21*  --  18  CREATININE 0.94 0.87 0.95  CALCIUM 9.1  --  9.0   CBC:  Recent Labs Lab 01/07/16 1833 01/08/16 0056 01/08/16 0644  WBC 10.3 9.6 9.5  NEUTROABS 5.4  --   --   HGB 10.8* 10.7* 10.6*  HCT 33.0* 32.1* 31.7*  MCV 91.9 92.6 90.7  PLT 240 232 235    Recent Results (from the past 240 hour(s))  Urine culture     Status: None   Collection Time: 01/07/16  6:33 PM  Result Value Ref Range Status   Specimen Description URINE, RANDOM  Final   Special Requests NONE  Final   Culture   Final  MULTIPLE SPECIES PRESENT, SUGGEST RECOLLECTION MULTIPLE ORGANISMS PRESENT, NONE PREDOMINANT    Report Status 01/09/2016 FINAL  Final  MRSA PCR Screening     Status: None   Collection Time: 01/07/16 11:25 PM  Result Value Ref Range Status   MRSA by PCR NEGATIVE NEGATIVE Final    Comment:        The GeneXpert MRSA Assay (FDA approved for NASAL specimens only), is one component of a comprehensive MRSA colonization surveillance program. It is not intended to diagnose MRSA infection nor to guide or monitor treatment for MRSA infections.      Studies: Dg Chest 1 View  01/09/2016  CLINICAL DATA:  Productive cough an audible wheezing for 1 day. EXAM: CHEST 1 VIEW COMPARISON:  01/07/2016 FINDINGS: Mild enlargement  of the cardiopericardial silhouette, stable. No mediastinal or hilar masses or evidence of adenopathy. Mild central vascular congestion and interstitial thickening. No focal consolidation is seen to suggest pneumonia. No convincing pleural effusion or pneumothorax. IMPRESSION: 1. Cardiomegaly with mild central vascular congestion interstitial thickening. Findings suggest mild congestive heart failure. No convincing pneumonia. Electronically Signed   By: Lajean Manes M.D.   On: 01/09/2016 11:37   Ct Head Wo Contrast  01/07/2016  CLINICAL DATA:  Altered mental status EXAM: CT HEAD WITHOUT CONTRAST TECHNIQUE: Contiguous axial images were obtained from the base of the skull through the vertex without intravenous contrast. COMPARISON:  02/13/2009 FINDINGS: Motion degraded images. No evidence of parenchymal hemorrhage or extra-axial fluid collection. No mass lesion, mass effect, or midline shift. No CT evidence of acute infarction. Subcortical white matter and periventricular small vessel ischemic changes. Global cortical atrophy.  No ventriculomegaly. The visualized paranasal sinuses are essentially clear. The mastoid air cells are unopacified. No evidence of calvarial fracture. IMPRESSION: No evidence of acute intracranial abnormality. Mild atrophy with small vessel ischemic changes. Electronically Signed   By: Julian Hy M.D.   On: 01/07/2016 22:03   Dg Chest Portable 1 View  01/07/2016  CLINICAL DATA:  Altered mental status. No reported shortness of breath. EXAM: PORTABLE CHEST 1 VIEW COMPARISON:  06/10/2015 FINDINGS: Mild enlargement of the cardiopericardial silhouette, stable. No mediastinal or hilar masses or convincing adenopathy. Lungs are clear.  No pleural effusion or pneumothorax. Bony thorax is demineralized. Right humeral head appears subluxed inferiorly in relation to the glenoid, unchanged from the prior chest radiograph. IMPRESSION: No acute cardiopulmonary disease. Electronically Signed   By:  Lajean Manes M.D.   On: 01/07/2016 19:01    Scheduled Meds: . allopurinol  100 mg Oral Daily  . budesonide (PULMICORT) nebulizer solution  0.25 mg Nebulization BID  . cefTRIAXone (ROCEPHIN)  IV  1 g Intravenous QPC supper  . clonazePAM  0.25 mg Oral QHS  . cyanocobalamin  500 mcg Oral BID  . doxycycline (VIBRAMYCIN) IV  100 mg Intravenous Q12H  . enoxaparin (LOVENOX) injection  40 mg Subcutaneous BID  . ferrous sulfate  325 mg Oral TID WC  . furosemide  20 mg Intravenous Once  . furosemide  20 mg Oral Daily  . gabapentin  300 mg Oral TID  . guaiFENesin  600 mg Oral BID  . ipratropium-albuterol  3 mL Nebulization Q6H  . irbesartan  150 mg Oral Daily  . levothyroxine  75 mcg Oral QAC breakfast  . loratadine  10 mg Oral Daily  . pantoprazole  40 mg Oral Daily  . PARoxetine  30 mg Oral Daily  . QUEtiapine  25 mg Oral QHS  . sodium chloride flush  3 mL Intravenous Q12H    Assessment/Plan:  1. Acute encephalopathy and fever. Daughter is still concerned that the patient is talking out of her head. The patient was recently started on Norco. All medications that could cause altered mental status have been discontinued except for the gabapentin. I will try Seroquel at night to get the patient sleeping. The patient has a chronic urinary catheter which was changed and initial culture showed mixed organisms. I will get an ID consultation. I cannot send another urine Culture at this point. 2. Audible wheeze. Could be bronchitis. Start budesonide nebulizer and DuoNeb nebulizers and prednisone low dose. Empiric Rocephin and doxycycline. Repeat chest x-ray showed mild fluid will give 1 dose of IV Lasix. 3. Chronic Foley catheter. Unless the patient is septic and do not like to treat catheter-based infections. The patient did have a low-grade fever and altered mental status. Urine culture showed mixed organisms. I will not send another urine culture. 4. Obesity 5. History of congestive heart failure-  BNP in normal range 6. Essential hypertension continue usual medications 7. Hypothyroidism unspecified continue levothyroxine 8. Anxiety depression- continue psychiatric medications 9. Gastroesophageal reflux disease without esophagitis on Protonix  Code Status:     Code Status Orders        Start     Ordered   01/07/16 2226  Full code   Continuous     01/07/16 2225    Code Status History    Date Active Date Inactive Code Status Order ID Comments User Context   This patient has a current code status but no historical code status.     Family Communication: Daughter at the bedside Disposition Plan: Back to facility soon  Antibiotics:  Rocephin  Doxycycline  Time spent: 22 minutes  Loletha Grayer  [facility]

## 2016-01-09 NOTE — Progress Notes (Signed)
Per MD patient is not medically stable for D/C today. Clinical Education officer, museum (CSW) made [employer] at [facility] aware of above. CSW will continue to follow and assist as needed.   Blima Rich, LCSW [phone]

## 2016-01-09 NOTE — Care Management Obs Status (Signed)
MEDICARE OBSERVATION STATUS NOTIFICATION   Patient Details  Name: Kathy Guzman MRN: [id number] Date of Birth: 28-Nov-1934   Medicare Observation Status Notification Given:  Yes    Marshell Garfinkel, RN 01/09/2016, 9:33 AM

## 2016-01-10 DIAGNOSIS — E039 Hypothyroidism, unspecified: Secondary | ICD-10-CM | POA: Diagnosis present

## 2016-01-10 DIAGNOSIS — K219 Gastro-esophageal reflux disease without esophagitis: Secondary | ICD-10-CM | POA: Diagnosis present

## 2016-01-10 DIAGNOSIS — N3000 Acute cystitis without hematuria: Secondary | ICD-10-CM | POA: Diagnosis present

## 2016-01-10 DIAGNOSIS — Z87891 Personal history of nicotine dependence: Secondary | ICD-10-CM | POA: Diagnosis not present

## 2016-01-10 DIAGNOSIS — N3289 Other specified disorders of bladder: Secondary | ICD-10-CM | POA: Diagnosis present

## 2016-01-10 DIAGNOSIS — J449 Chronic obstructive pulmonary disease, unspecified: Secondary | ICD-10-CM | POA: Diagnosis present

## 2016-01-10 DIAGNOSIS — Z79899 Other long term (current) drug therapy: Secondary | ICD-10-CM | POA: Diagnosis not present

## 2016-01-10 DIAGNOSIS — F329 Major depressive disorder, single episode, unspecified: Secondary | ICD-10-CM | POA: Diagnosis present

## 2016-01-10 DIAGNOSIS — F419 Anxiety disorder, unspecified: Secondary | ICD-10-CM | POA: Diagnosis present

## 2016-01-10 DIAGNOSIS — M1990 Unspecified osteoarthritis, unspecified site: Secondary | ICD-10-CM | POA: Diagnosis present

## 2016-01-10 DIAGNOSIS — E871 Hypo-osmolality and hyponatremia: Secondary | ICD-10-CM | POA: Diagnosis present

## 2016-01-10 DIAGNOSIS — G9340 Encephalopathy, unspecified: Secondary | ICD-10-CM | POA: Diagnosis present

## 2016-01-10 DIAGNOSIS — Z85828 Personal history of other malignant neoplasm of skin: Secondary | ICD-10-CM | POA: Diagnosis not present

## 2016-01-10 DIAGNOSIS — I119 Hypertensive heart disease without heart failure: Secondary | ICD-10-CM | POA: Diagnosis present

## 2016-01-10 DIAGNOSIS — D649 Anemia, unspecified: Secondary | ICD-10-CM | POA: Diagnosis present

## 2016-01-10 DIAGNOSIS — F418 Other specified anxiety disorders: Secondary | ICD-10-CM | POA: Diagnosis present

## 2016-01-10 DIAGNOSIS — E875 Hyperkalemia: Secondary | ICD-10-CM | POA: Diagnosis present

## 2016-01-10 DIAGNOSIS — E785 Hyperlipidemia, unspecified: Secondary | ICD-10-CM | POA: Diagnosis present

## 2016-01-10 DIAGNOSIS — J069 Acute upper respiratory infection, unspecified: Secondary | ICD-10-CM | POA: Diagnosis present

## 2016-01-10 DIAGNOSIS — Z888 Allergy status to other drugs, medicaments and biological substances status: Secondary | ICD-10-CM | POA: Diagnosis not present

## 2016-01-10 DIAGNOSIS — I509 Heart failure, unspecified: Secondary | ICD-10-CM | POA: Diagnosis present

## 2016-01-10 DIAGNOSIS — R410 Disorientation, unspecified: Secondary | ICD-10-CM | POA: Diagnosis present

## 2016-01-10 DIAGNOSIS — Z90710 Acquired absence of both cervix and uterus: Secondary | ICD-10-CM | POA: Diagnosis not present

## 2016-01-10 DIAGNOSIS — Z9049 Acquired absence of other specified parts of digestive tract: Secondary | ICD-10-CM | POA: Diagnosis not present

## 2016-01-10 DIAGNOSIS — J9801 Acute bronchospasm: Secondary | ICD-10-CM | POA: Diagnosis present

## 2016-01-10 DIAGNOSIS — R339 Retention of urine, unspecified: Secondary | ICD-10-CM | POA: Diagnosis present

## 2016-01-10 LAB — EXPECTORATED SPUTUM ASSESSMENT W GRAM STAIN, RFLX TO RESP C

## 2016-01-10 LAB — EXPECTORATED SPUTUM ASSESSMENT W REFEX TO RESP CULTURE

## 2016-01-10 LAB — PROCALCITONIN: PROCALCITONIN: 0.16 ng/mL

## 2016-01-10 MED ORDER — ACETYLCYSTEINE 20 % IN SOLN
4.0000 mL | Freq: Two times a day (BID) | RESPIRATORY_TRACT | Status: DC
  Administered 2016-01-10 – 2016-01-13 (×7): 4 mL via RESPIRATORY_TRACT
  Filled 2016-01-10 (×8): qty 4

## 2016-01-10 MED ORDER — DOXYCYCLINE HYCLATE 100 MG PO TABS: 100.0000 mg | ORAL_TABLET | Freq: Two times a day (BID) | ORAL | Status: DC

## 2016-01-10 MED ORDER — CLONAZEPAM 0.5 MG PO TABS: 0.5000 mg | ORAL_TABLET | Freq: Every day | ORAL | Status: DC

## 2016-01-10 MED ORDER — METHYLPREDNISOLONE SODIUM SUCC 40 MG IJ SOLR
40.0000 mg | Freq: Every day | INTRAMUSCULAR | Status: DC
  Administered 2016-01-10 – 2016-01-13 (×4): 40 mg via INTRAVENOUS
  Filled 2016-01-10 (×4): qty 1

## 2016-01-10 MED ORDER — QUETIAPINE FUMARATE 25 MG PO TABS: 25.0000 mg | ORAL_TABLET | Freq: Every day | ORAL | Status: DC

## 2016-01-10 NOTE — Progress Notes (Signed)
Patient ID: Kathy Guzman, female   DOB: 07-06-1935, 81 y.o.   MRN: [id number] [facility] PROGRESS NOTE  Kathy Guzman [id number] DOB: 13-Jun-1935 DOA: 01/07/2016 PCP: Kathy Rana, MD  HPI/Subjective: Patient still audible wheezing. Daughter states that the mental status is still not quite right. The daughter states that she is doing better but not totally better. Still with cough. Daughter states that the patient did not sleep at the nurse said the patient didn't sleep.  Objective: Filed Vitals:   01/10/16 1125 01/10/16 1546  BP: 151/54 157/70  Pulse: 81 83  Temp: 98.2 F (36.8 C) 98.4 F (36.9 C)  Resp: 20 20    Filed Weights   01/07/16 1824 01/08/16 0016  Weight: 159 kg (350 lb 8.5 oz) 154.586 kg (340 lb 12.8 oz)    ROS: Review of Systems  Constitutional: Negative for fever and chills.  Eyes: Negative for blurred vision.  Respiratory: Positive for cough, shortness of breath and wheezing.   Cardiovascular: Negative for chest pain.  Gastrointestinal: Negative for nausea, vomiting, abdominal pain, diarrhea and constipation.  Genitourinary: Negative for dysuria.  Musculoskeletal: Negative for joint pain.  Neurological: Negative for dizziness and headaches.   Exam: Physical Exam  HENT:  Nose: No mucosal edema.  Mouth/Throat: No oropharyngeal exudate or posterior oropharyngeal edema.  Bilateral tympanic membrane erythematous and bulging  Eyes: Conjunctivae, EOM and lids are normal. Pupils are equal, round, and reactive to light.  Neck: No JVD present. Carotid bruit is not present. No edema present. No thyroid mass and no thyromegaly present.  Cardiovascular: S1 normal and S2 normal.  Exam reveals no gallop.   No murmur heard. Pulses:      Dorsalis pedis pulses are 2+ on the right side, and 2+ on the left side.  Respiratory: No respiratory distress. She has decreased breath sounds in the right middle field, the right lower field, the left middle field  and the left lower field. She has wheezes in the right middle field, the right lower field, the left middle field and the left lower field. She has no rhonchi. She has no rales.  GI: Soft. Bowel sounds are normal. There is no tenderness.  Musculoskeletal:       Right ankle: She exhibits swelling.       Left ankle: She exhibits swelling.  Lymphadenopathy:    She has no cervical adenopathy.  Neurological: She is alert.  Skin: Skin is warm. No rash noted. Nails show no clubbing.  Psychiatric: Her affect is inappropriate.      Data Reviewed: Basic Metabolic Panel:  Recent Labs Lab 01/07/16 1833 01/08/16 0056 01/08/16 0644  NA 134*  --  135  K 5.3*  --  4.3  CL 99*  --  98*  CO2 26  --  26  GLUCOSE 91  --  96  BUN 21*  --  18  CREATININE 0.94 0.87 0.95  CALCIUM 9.1  --  9.0   CBC:  Recent Labs Lab 01/07/16 1833 01/08/16 0056 01/08/16 0644  WBC 10.3 9.6 9.5  NEUTROABS 5.4  --   --   HGB 10.8* 10.7* 10.6*  HCT 33.0* 32.1* 31.7*  MCV 91.9 92.6 90.7  PLT 240 232 235    Recent Results (from the past 240 hour(s))  Culture, sputum-assessment     Status: None (Preliminary result)   Collection Time: 01/07/16  2:47 PM  Result Value Ref Range Status   Specimen Description SPUTUM  Final   Special Requests  SPUTUM  Final   Sputum evaluation THIS SPECIMEN IS ACCEPTABLE FOR SPUTUM CULTURE  Final   Report Status PENDING  Incomplete  Culture, respiratory (NON-Expectorated)     Status: None (Preliminary result)   Collection Time: 01/07/16  2:47 PM  Result Value Ref Range Status   Specimen Description SPUTUM  Final   Special Requests SPUTUM Reflexed from [id number]  Final   Gram Stain   Final    GOOD SPECIMEN - 80-90% WBCS MODERATE WBC SEEN RARE YEAST RARE GRAM POSITIVE COCCI    Culture Consistent with normal respiratory flora.  Final   Report Status PENDING  Incomplete  Urine culture     Status: None   Collection Time: 01/07/16  6:33 PM  Result Value Ref Range Status    Specimen Description URINE, RANDOM  Final   Special Requests NONE  Final   Culture   Final    MULTIPLE SPECIES PRESENT, SUGGEST RECOLLECTION MULTIPLE ORGANISMS PRESENT, NONE PREDOMINANT    Report Status 01/09/2016 FINAL  Final  MRSA PCR Screening     Status: None   Collection Time: 01/07/16 11:25 PM  Result Value Ref Range Status   MRSA by PCR NEGATIVE NEGATIVE Final    Comment:        The GeneXpert MRSA Assay (FDA approved for NASAL specimens only), is one component of a comprehensive MRSA colonization surveillance program. It is not intended to diagnose MRSA infection nor to guide or monitor treatment for MRSA infections.      Studies: Dg Chest 1 View  01/09/2016  CLINICAL DATA:  Productive cough an audible wheezing for 1 day. EXAM: CHEST 1 VIEW COMPARISON:  01/07/2016 FINDINGS: Mild enlargement of the cardiopericardial silhouette, stable. No mediastinal or hilar masses or evidence of adenopathy. Mild central vascular congestion and interstitial thickening. No focal consolidation is seen to suggest pneumonia. No convincing pleural effusion or pneumothorax. IMPRESSION: 1. Cardiomegaly with mild central vascular congestion interstitial thickening. Findings suggest mild congestive heart failure. No convincing pneumonia. Electronically Signed   By: Lajean Manes M.D.   On: 01/09/2016 11:37    Scheduled Meds: . acetylcysteine  4 mL Nebulization BID  . allopurinol  100 mg Oral Daily  . budesonide (PULMICORT) nebulizer solution  0.25 mg Nebulization BID  . clonazePAM  0.25 mg Oral QHS  . cyanocobalamin  500 mcg Oral BID  . enoxaparin (LOVENOX) injection  40 mg Subcutaneous BID  . ferrous sulfate  325 mg Oral TID WC  . furosemide  20 mg Oral Daily  . gabapentin  300 mg Oral TID  . guaiFENesin  600 mg Oral BID  . ipratropium-albuterol  3 mL Nebulization Q6H  . irbesartan  150 mg Oral Daily  . levothyroxine  75 mcg Oral QAC breakfast  . loratadine  10 mg Oral Daily  .  methylPREDNISolone (SOLU-MEDROL) injection  40 mg Intravenous Daily  . pantoprazole  40 mg Oral Daily  . PARoxetine  30 mg Oral Daily  . QUEtiapine  25 mg Oral QHS  . sodium chloride flush  3 mL Intravenous Q12H    Assessment/Plan:  1. Acute encephalopathy and fever. All medications that can cause altered mental status were stopped. I started Seroquel at night so the patient didn't sleep. 2. Audible wheeze. Start Solu-Medrol 40 mg IV daily. Continue budesonide nebulizer and DuoNeb nebulizers. Empiric Rocephin and doxycycline given previously. Infectious disease specialist may stop antibiotics depending on calcitonin results. 3. Chronic Foley catheter. Foley catheter changed 4. Obesity 5. History of congestive heart  failure- BNP in normal range 6. Essential hypertension continue usual medications 7. Hypothyroidism unspecified continue levothyroxine 8. Anxiety depression- continue psychiatric medications 9. Gastroesophageal reflux disease without esophagitis on Protonix  Code Status:     Code Status Orders        Start     Ordered   01/07/16 2226  Full code   Continuous     01/07/16 2225    Code Status History    Date Active Date Inactive Code Status Order ID Comments User Context   This patient has a current code status but no historical code status.     Family Communication: Daughter at the bedside Disposition Plan: Back to facility soon  Time spent: 20 minutes  Loletha Grayer  [facility]

## 2016-01-10 NOTE — Consult Note (Signed)
[facility] Infectious Disease     Reason for Consult: Fever, confusion   Referring Physician: Earleen Newport, R Date of Admission:  01/07/2016   Principal Problem:   Delirium Active Problems:   Acute cystitis without hematuria   Hyperkalemia   Hyponatremia   Anemia   Morbid obesity ([place])   Body aches   HPI: Kathy Guzman is a 81 y.o. female who resides at a NH with hx of CHF, COPD, chronic indwelling foley cath  admitted 3/7 with AMS.  ON admission her WBC was 10.3, no initial fever, UA with 6-30 wbc, Flu PCR neg and CT head + cxr negative. Developed fevers to 100.4 and remains confused despite starting ceftriaxone and doxy. UCX was mixed. Sputum cx with resp flora, MRSA PCR negative.  Her most recent fever was 3/8 at 100.4.   Past Medical History  Diagnosis Date  . CHF (congestive heart failure) ([place])   . Hypertension   . Hyperlipidemia   . COPD (chronic obstructive pulmonary disease) ([place])   . GERD (gastroesophageal reflux disease)   . Hypothyroid   . Osteoarthritis   . Depression   . Anxiety   . Skin cancer    Past Surgical History  Procedure Laterality Date  . Partial hysterectomy    . Cholecystectomy     Social History  Substance Use Topics  . Smoking status: Former Smoker    Quit date: 03/18/1994  . Smokeless tobacco: Never Used  . Alcohol Use: No   No family history on file.  Allergies:  Allergies  Allergen Reactions  . Tramadol Itching    Current antibiotics: Antibiotics Given (last 72 hours)    Date/Time Action Medication Dose Rate   01/07/16 2348 Given   piperacillin-tazobactam (ZOSYN) IVPB 4.5 g 4.5 g 25 mL/hr   01/08/16 7124 Given   piperacillin-tazobactam (ZOSYN) IVPB 4.5 g 4.5 g 25 mL/hr   01/08/16 1728 Given   cefTRIAXone (ROCEPHIN) 1 g in dextrose 5 % 50 mL IVPB 1 g 100 mL/hr   01/09/16 1127 Given   doxycycline (VIBRAMYCIN) 100 mg in dextrose 5 % 250 mL IVPB 100 mg 125 mL/hr   01/09/16 1604 Given   cefTRIAXone (ROCEPHIN) 1 g in dextrose 5  % 50 mL IVPB 1 g 100 mL/hr   01/09/16 2230 Given   doxycycline (VIBRAMYCIN) 100 mg in dextrose 5 % 250 mL IVPB 100 mg 125 mL/hr   01/10/16 0917 Given   doxycycline (VIBRAMYCIN) 100 mg in dextrose 5 % 250 mL IVPB 100 mg 125 mL/hr      MEDICATIONS: . acetylcysteine  4 mL Nebulization BID  . allopurinol  100 mg Oral Daily  . budesonide (PULMICORT) nebulizer solution  0.25 mg Nebulization BID  . cefTRIAXone (ROCEPHIN)  IV  1 g Intravenous QPC supper  . clonazePAM  0.25 mg Oral QHS  . cyanocobalamin  500 mcg Oral BID  . doxycycline  100 mg Oral Q12H  . doxycycline (VIBRAMYCIN) IV  100 mg Intravenous Q12H  . enoxaparin (LOVENOX) injection  40 mg Subcutaneous BID  . ferrous sulfate  325 mg Oral TID WC  . furosemide  20 mg Oral Daily  . gabapentin  300 mg Oral TID  . guaiFENesin  600 mg Oral BID  . ipratropium-albuterol  3 mL Nebulization Q6H  . irbesartan  150 mg Oral Daily  . levothyroxine  75 mcg Oral QAC breakfast  . loratadine  10 mg Oral Daily  . methylPREDNISolone (SOLU-MEDROL) injection  40 mg Intravenous Daily  .  pantoprazole  40 mg Oral Daily  . PARoxetine  30 mg Oral Daily  . QUEtiapine  25 mg Oral QHS  . sodium chloride flush  3 mL Intravenous Q12H    Review of Systems - 11 systems reviewed and negative per HPI   OBJECTIVE: Temp:  [97.8 F (36.6 C)-98.6 F (37 C)] 98.2 F (36.8 C) (03/10 1125) Pulse Rate:  [80-94] 81 (03/10 1125) Resp:  [18-22] 20 (03/10 1125) BP: (125-154)/(54-97) 151/54 mmHg (03/10 1125) SpO2:  [94 %-100 %] 100 % (03/10 1125) Physical Exam  Constitutional:  Morbidly obese, lying in bed, eating french fries and drinking a soda. She knows she is in the hospital in [place] and it is 2017. Daughter at bedside reports she is close to baseline MS.  HENT: /AT, PERRLA, no scleral icterus Mouth/Throat: Oropharynx is clear and moist.edentulous  No oropharyngeal exudate.  Cardiovascular: Normal rate, regular rhythm  Pulmonary/Chest: diffuse wheeze,  no crackles Neck = supple, no nuchal rigidity Abdominal: Soft. Bowel sounds are normal.  exhibits no distension. There is no tenderness.  Lymphadenopathy: no cervical adenopathy. No axillary adenopathy Neurological: alert and oriented to person, place, and time.  Skin: no rash, multiple exocrciations on abd, none appear infected Psychiatric: a normal mood and affect.  behavior is normal.    LABS: No results found for this or any previous visit (from the past 48 hour(s)). No components found for: ESR, C REACTIVE PROTEIN MICRO: Recent Results (from the past 720 hour(s))  Culture, sputum-assessment     Status: None (Preliminary result)   Collection Time: 01/07/16  2:47 PM  Result Value Ref Range Status   Specimen Description SPUTUM  Final   Special Requests SPUTUM  Final   Sputum evaluation THIS SPECIMEN IS ACCEPTABLE FOR SPUTUM CULTURE  Final   Report Status PENDING  Incomplete  Culture, respiratory (NON-Expectorated)     Status: None (Preliminary result)   Collection Time: 01/07/16  2:47 PM  Result Value Ref Range Status   Specimen Description SPUTUM  Final   Special Requests SPUTUM Reflexed from [id number]  Final   Gram Stain   Final    GOOD SPECIMEN - 80-90% WBCS MODERATE WBC SEEN RARE YEAST RARE GRAM POSITIVE COCCI    Culture Consistent with normal respiratory flora.  Final   Report Status PENDING  Incomplete  Urine culture     Status: None   Collection Time: 01/07/16  6:33 PM  Result Value Ref Range Status   Specimen Description URINE, RANDOM  Final   Special Requests NONE  Final   Culture   Final    MULTIPLE SPECIES PRESENT, SUGGEST RECOLLECTION MULTIPLE ORGANISMS PRESENT, NONE PREDOMINANT    Report Status 01/09/2016 FINAL  Final  MRSA PCR Screening     Status: None   Collection Time: 01/07/16 11:25 PM  Result Value Ref Range Status   MRSA by PCR NEGATIVE NEGATIVE Final    Comment:        The GeneXpert MRSA Assay (FDA approved for NASAL specimens only), is one  component of a comprehensive MRSA colonization surveillance program. It is not intended to diagnose MRSA infection nor to guide or monitor treatment for MRSA infections.     IMAGING: Dg Chest 1 View  01/09/2016  CLINICAL DATA:  Productive cough an audible wheezing for 1 day. EXAM: CHEST 1 VIEW COMPARISON:  01/07/2016 FINDINGS: Mild enlargement of the cardiopericardial silhouette, stable. No mediastinal or hilar masses or evidence of adenopathy. Mild central vascular congestion and interstitial thickening. No focal  consolidation is seen to suggest pneumonia. No convincing pleural effusion or pneumothorax. IMPRESSION: 1. Cardiomegaly with mild central vascular congestion interstitial thickening. Findings suggest mild congestive heart failure. No convincing pneumonia. Electronically Signed   By: Lajean Manes M.D.   On: 01/09/2016 11:37   Ct Head Wo Contrast  01/07/2016  CLINICAL DATA:  Altered mental status EXAM: CT HEAD WITHOUT CONTRAST TECHNIQUE: Contiguous axial images were obtained from the base of the skull through the vertex without intravenous contrast. COMPARISON:  02/13/2009 FINDINGS: Motion degraded images. No evidence of parenchymal hemorrhage or extra-axial fluid collection. No mass lesion, mass effect, or midline shift. No CT evidence of acute infarction. Subcortical white matter and periventricular small vessel ischemic changes. Global cortical atrophy.  No ventriculomegaly. The visualized paranasal sinuses are essentially clear. The mastoid air cells are unopacified. No evidence of calvarial fracture. IMPRESSION: No evidence of acute intracranial abnormality. Mild atrophy with small vessel ischemic changes. Electronically Signed   By: Julian Hy M.D.   On: 01/07/2016 22:03   Dg Chest Portable 1 View  01/07/2016  CLINICAL DATA:  Altered mental status. No reported shortness of breath. EXAM: PORTABLE CHEST 1 VIEW COMPARISON:  06/10/2015 FINDINGS: Mild enlargement of the  cardiopericardial silhouette, stable. No mediastinal or hilar masses or convincing adenopathy. Lungs are clear.  No pleural effusion or pneumothorax. Bony thorax is demineralized. Right humeral head appears subluxed inferiorly in relation to the glenoid, unchanged from the prior chest radiograph. IMPRESSION: No acute cardiopulmonary disease. Electronically Signed   By: Lajean Manes M.D.   On: 01/07/2016 19:01    Assessment:   Kathy Guzman is a 81 y.o. female who resides at a NH with hx of CHF, COPD, chronic indwelling foley cath  admitted 3/7 with AMS.  ON admission her WBC was 10.3, no initial fever, UA with 6-30 wbc, Flu PCR neg and CT head + cxr negative. Developed fevers to 100.4 and remains confused despite starting ceftriaxone and doxy. UCX was mixed. Sputum cx with resp flora, MRSA PCR negative.  Her most recent fever was 3/8 at 100.4. WBC now 9.5 She most likely had a viral URI causing her cough, low grade fever. No evidence meningitis, infectious encephalitis. Doubt she has  UTI since only 6-30 WBC in UA in patient with chronic foley is not suggestive, esp with mixed flora.  Recommendations Check procalcitonin  If PCT is nml then stop abx and continue treatment of the probable COPD exacerbation If PCT is elevated will rec 7 total days of abx for CAP (today is day 4) Can transition to oral levo if ready for dc prior to completing ctx and doxy  Thank you very much for allowing me to participate in the care of this patient. Please call with questions.   Cheral Marker. Ola Spurr, MD

## 2016-01-10 NOTE — Progress Notes (Signed)
Per MD patient is not medically stable for D/C today and may be ready for D/C over the weekend. Per [facility] admissions coordinator at [facility] patient can return over the weekend to room 206. Clinical Education officer, museum (CSW) sent D/C orders to [facility] today. CSW will continue to follow and assist as needed.    Blima Rich, LCSW [phone]

## 2016-01-10 NOTE — Progress Notes (Signed)
MD Wieting gave order to discontinue telemetry

## 2016-01-10 NOTE — Progress Notes (Signed)
Spoke with Keane Scrape, [employer] rep at [phone], to notify of non-emergent EMS transport.  Auth notification reference given as [id number].   Service date range good from 01/10/16 - 04/09/16.   Gap exception requested to determine if services can be considered at an in-network level.

## 2016-01-10 NOTE — Progress Notes (Signed)
ID addendum PCT 0.16 Stop abx and follow.

## 2016-01-11 LAB — CULTURE, RESPIRATORY: CULTURE: NORMAL

## 2016-01-11 LAB — CREATININE, SERUM: CREATININE: 0.81 mg/dL (ref 0.44–1.00)

## 2016-01-11 LAB — CULTURE, RESPIRATORY W GRAM STAIN

## 2016-01-11 MED ORDER — FLUTICASONE PROPIONATE 50 MCG/ACT NA SUSP
2.0000 | Freq: Every day | NASAL | Status: DC
  Administered 2016-01-12 – 2016-01-13 (×2): 2 via NASAL
  Filled 2016-01-11: qty 16

## 2016-01-11 MED ORDER — MAGIC MOUTHWASH
10.0000 mL | Freq: Four times a day (QID) | ORAL | Status: DC
  Administered 2016-01-11 – 2016-01-13 (×7): 10 mL via ORAL
  Filled 2016-01-11 (×11): qty 10

## 2016-01-11 MED ORDER — LIDOCAINE VISCOUS 2 % MT SOLN
15.0000 mL | Freq: Four times a day (QID) | OROMUCOSAL | Status: DC
  Administered 2016-01-11 – 2016-01-13 (×7): 15 mL via OROMUCOSAL
  Filled 2016-01-11 (×11): qty 15

## 2016-01-11 MED ORDER — MAGIC MOUTHWASH W/LIDOCAINE
10.0000 mL | Freq: Four times a day (QID) | ORAL | Status: DC
  Filled 2016-01-11 (×4): qty 10

## 2016-01-11 NOTE — Progress Notes (Signed)
Patient ID: Kathy Guzman, female   DOB: [birth year], 81 y.o.   MRN: [id number] [facility] PROGRESS NOTE  Kathy Guzman [id number] DOB: Oct 01, 1935 DOA: 01/07/2016 PCP: Margarita Rana, MD  HPI/Subjective: Complains of nasal congestion cough and shortness of breath patient's mental status is improved more daughter at bedside  Objective: Filed Vitals:   01/11/16 0422 01/11/16 0738  BP: 132/63 139/57  Pulse: 112 77  Temp: 97.4 F (36.3 C) 98.2 F (36.8 C)  Resp: 20 20    Filed Weights   01/07/16 1824 01/08/16 0016  Weight: 159 kg (350 lb 8.5 oz) 154.586 kg (340 lb 12.8 oz)    ROS: Review of Systems  Constitutional: Negative for fever and chills.  Eyes: Negative for blurred vision.  Respiratory: Positive for cough, shortness of breath and wheezing.   Cardiovascular: Negative for chest pain.  Gastrointestinal: Negative for nausea, vomiting, abdominal pain, diarrhea and constipation.  Genitourinary: Negative for dysuria.  Musculoskeletal: Negative for joint pain.  Neurological: Negative for dizziness and headaches.   Exam: Physical Exam  HENT:  Nose: No mucosal edema.  Mouth/Throat: No oropharyngeal exudate or posterior oropharyngeal edema.  Eyes: Conjunctivae, EOM and lids are normal. Pupils are equal, round, and reactive to light.  Neck: No JVD present. Carotid bruit is not present. No edema present. No thyroid mass and no thyromegaly present.  Cardiovascular: S1 normal and S2 normal.  Exam reveals no gallop.   No murmur heard. Pulses:      Dorsalis pedis pulses are 2+ on the right side, and 2+ on the left side.  Respiratory: No respiratory distress. Diminished breath sounds GI: Soft. Bowel sounds are normal. There is no tenderness.  Musculoskeletal:       Right ankle: She exhibits swelling.       Left ankle: She exhibits swelling.  Lymphadenopathy:    She has no cervical adenopathy.  Neurological: She is alert.  Skin: Skin is warm. No rash noted.  Nails show no clubbing.  Psychiatric: Her affect is  blunt.     Data Reviewed: Basic Metabolic Panel:  Recent Labs Lab 01/07/16 1833 01/08/16 0056 01/08/16 0644 01/11/16 0759  NA 134*  --  135  --   K 5.3*  --  4.3  --   CL 99*  --  98*  --   CO2 26  --  26  --   GLUCOSE 91  --  96  --   BUN 21*  --  18  --   CREATININE 0.94 0.87 0.95 0.81  CALCIUM 9.1  --  9.0  --    CBC:  Recent Labs Lab 01/07/16 1833 01/08/16 0056 01/08/16 0644  WBC 10.3 9.6 9.5  NEUTROABS 5.4  --   --   HGB 10.8* 10.7* 10.6*  HCT 33.0* 32.1* 31.7*  MCV 91.9 92.6 90.7  PLT 240 232 235    Recent Results (from the past 240 hour(s))  Culture, sputum-assessment     Status: None   Collection Time: 01/07/16  2:47 PM  Result Value Ref Range Status   Specimen Description SPUTUM  Final   Special Requests SPUTUM  Final   Sputum evaluation THIS SPECIMEN IS ACCEPTABLE FOR SPUTUM CULTURE  Final   Report Status 01/10/2016 FINAL  Final  Culture, respiratory (NON-Expectorated)     Status: None   Collection Time: 01/07/16  2:47 PM  Result Value Ref Range Status   Specimen Description SPUTUM  Final   Special Requests SPUTUM Reflexed from [id number]  Final   Gram Stain   Final    GOOD SPECIMEN - 80-90% WBCS MODERATE WBC SEEN RARE YEAST RARE GRAM POSITIVE COCCI    Culture Consistent with normal respiratory flora.  Final   Report Status 01/11/2016 FINAL  Final  Urine culture     Status: None   Collection Time: 01/07/16  6:33 PM  Result Value Ref Range Status   Specimen Description URINE, RANDOM  Final   Special Requests NONE  Final   Culture   Final    MULTIPLE SPECIES PRESENT, SUGGEST RECOLLECTION MULTIPLE ORGANISMS PRESENT, NONE PREDOMINANT    Report Status 01/09/2016 FINAL  Final  MRSA PCR Screening     Status: None   Collection Time: 01/07/16 11:25 PM  Result Value Ref Range Status   MRSA by PCR NEGATIVE NEGATIVE Final    Comment:        The GeneXpert MRSA Assay (FDA approved for NASAL  specimens only), is one component of a comprehensive MRSA colonization surveillance program. It is not intended to diagnose MRSA infection nor to guide or monitor treatment for MRSA infections.      Studies: No results found.  Scheduled Meds: . acetylcysteine  4 mL Nebulization BID  . allopurinol  100 mg Oral Daily  . budesonide (PULMICORT) nebulizer solution  0.25 mg Nebulization BID  . clonazePAM  0.25 mg Oral QHS  . cyanocobalamin  500 mcg Oral BID  . enoxaparin (LOVENOX) injection  40 mg Subcutaneous BID  . ferrous sulfate  325 mg Oral TID WC  . fluticasone  2 spray Each Nare Daily  . furosemide  20 mg Oral Daily  . gabapentin  300 mg Oral TID  . guaiFENesin  600 mg Oral BID  . ipratropium-albuterol  3 mL Nebulization Q6H  . irbesartan  150 mg Oral Daily  . levothyroxine  75 mcg Oral QAC breakfast  . loratadine  10 mg Oral Daily  . magic mouthwash w/lidocaine  10 mL Oral QID  . methylPREDNISolone (SOLU-MEDROL) injection  40 mg Intravenous Daily  . pantoprazole  40 mg Oral Daily  . PARoxetine  30 mg Oral Daily  . QUEtiapine  25 mg Oral QHS  . sodium chloride flush  3 mL Intravenous Q12H    Assessment/Plan:  1. Acute encephalopathy and fever. Improved, antibiotics stop per infectious disease. 2. Acute bronchospasm continued therapy with Solu-Medrol and nebulizers. 3. Chronic Foley catheter. Foley catheter changed 4. Obesity weight loss advised 5. History of congestive heart failure- BNP in normal range 6. Essential hypertension continue usual medications 7. Hypothyroidism unspecified continue levothyroxine 8. Anxiety depression- continue psychiatric medications 9. Gastroesophageal reflux disease without esophagitis on Protonix  Code Status:     Code Status Orders        Start     Ordered   01/07/16 2226  Full code   Continuous     01/07/16 2225    Code Status History    Date Active Date Inactive Code Status Order ID Comments User Context   This patient  has a current code status but no historical code status.     Family Communication: Daughter at the bedside Disposition Plan: Back to facility soon  Time spent: 20 minutes  [place] [place] [facility]

## 2016-01-12 LAB — BLOOD GAS, ARTERIAL
Acid-Base Excess: 12.3 mmol/L — ABNORMAL HIGH (ref 0.0–3.0)
Allens test (pass/fail): POSITIVE — AB
BICARBONATE: 38.9 meq/L — AB (ref 21.0–28.0)
FIO2: 0.28
O2 Saturation: 95.9 %
PATIENT TEMPERATURE: 37
PCO2 ART: 60 mmHg — AB (ref 32.0–48.0)
PO2 ART: 80 mmHg — AB (ref 83.0–108.0)
pH, Arterial: 7.42 (ref 7.350–7.450)

## 2016-01-12 LAB — PROCALCITONIN: PROCALCITONIN: 0.11 ng/mL

## 2016-01-12 MED ORDER — MODAFINIL 100 MG PO TABS
100.0000 mg | ORAL_TABLET | Freq: Every day | ORAL | Status: DC
  Administered 2016-01-13: 100 mg via ORAL
  Filled 2016-01-12 (×2): qty 1

## 2016-01-12 NOTE — Care Management Important Message (Signed)
Important Message  Patient Details  Name: Kathy Guzman MRN: [id number] Date of Birth: 1935/06/02   Medicare Important Message Given:  Yes    [name] A, RN 01/12/2016, 4:11 PM

## 2016-01-12 NOTE — Progress Notes (Signed)
Patient ID: Kathy Guzman, female   DOB: June 22, 1935, 81 y.o.   MRN: [id number] [facility] PROGRESS NOTE  Kathy Guzman [id number] DOB: 11/17/1934 DOA: 01/07/2016 PCP: Margarita Rana, MD  HPI/Subjective:  According to daughter patient acting more confused, currently patient thinks she in church  Objective: Filed Vitals:   01/12/16 0357 01/12/16 0814  BP: 148/49 106/72  Pulse: 84 79  Temp: 98 F (36.7 C) 98 F (36.7 C)  Resp: 20 16    Filed Weights   01/07/16 1824 01/08/16 0016  Weight: 159 kg (350 lb 8.5 oz) 154.586 kg (340 lb 12.8 oz)    ROS: Review of Systems  Constitutional: Negative for fever and chills.  Eyes: Negative for blurred vision.  Respiratory: Positive for cough, shortness of breath and wheezing.   Cardiovascular: Negative for chest pain.  Gastrointestinal: Negative for nausea, vomiting, abdominal pain, diarrhea and constipation.  Genitourinary: Negative for dysuria.  Musculoskeletal: Negative for joint pain.  Neurological: Negative for dizziness and headaches.   Exam: Physical Exam  HENT:  Nose: No mucosal edema.  Mouth/Throat: No oropharyngeal exudate or posterior oropharyngeal edema.  Eyes: Conjunctivae, EOM and lids are normal. Pupils are equal, round, and reactive to light.  Neck: No JVD present. Carotid bruit is not present. No edema present. No thyroid mass and no thyromegaly present.  Cardiovascular: S1 normal and S2 normal.  Exam reveals no gallop.   No murmur heard. Pulses:      Dorsalis pedis pulses are 2+ on the right side, and 2+ on the left side.  Respiratory: No respiratory distress. Diminished breath sounds GI: Soft. Bowel sounds are normal. There is no tenderness.  Musculoskeletal:       Right ankle: She exhibits swelling.       Left ankle: She exhibits swelling.  Lymphadenopathy:    She has no cervical adenopathy.  Neurological: She is alert.  she is currently confused Skin: Skin is warm. No rash noted. Nails show  no clubbing.  Psychiatric: Her affect is  blunt.     Data Reviewed: Basic Metabolic Panel:  Recent Labs Lab 01/07/16 1833 01/08/16 0056 01/08/16 0644 01/11/16 0759  NA 134*  --  135  --   K 5.3*  --  4.3  --   CL 99*  --  98*  --   CO2 26  --  26  --   GLUCOSE 91  --  96  --   BUN 21*  --  18  --   CREATININE 0.94 0.87 0.95 0.81  CALCIUM 9.1  --  9.0  --    CBC:  Recent Labs Lab 01/07/16 1833 01/08/16 0056 01/08/16 0644  WBC 10.3 9.6 9.5  NEUTROABS 5.4  --   --   HGB 10.8* 10.7* 10.6*  HCT 33.0* 32.1* 31.7*  MCV 91.9 92.6 90.7  PLT 240 232 235    Recent Results (from the past 240 hour(s))  Culture, sputum-assessment     Status: None   Collection Time: 01/07/16  2:47 PM  Result Value Ref Range Status   Specimen Description SPUTUM  Final   Special Requests SPUTUM  Final   Sputum evaluation THIS SPECIMEN IS ACCEPTABLE FOR SPUTUM CULTURE  Final   Report Status 01/10/2016 FINAL  Final  Culture, respiratory (NON-Expectorated)     Status: None   Collection Time: 01/07/16  2:47 PM  Result Value Ref Range Status   Specimen Description SPUTUM  Final   Special Requests SPUTUM Reflexed from [id number]  Final   Gram Stain   Final    GOOD SPECIMEN - 80-90% WBCS MODERATE WBC SEEN RARE YEAST RARE GRAM POSITIVE COCCI    Culture Consistent with normal respiratory flora.  Final   Report Status 01/11/2016 FINAL  Final  Urine culture     Status: None   Collection Time: 01/07/16  6:33 PM  Result Value Ref Range Status   Specimen Description URINE, RANDOM  Final   Special Requests NONE  Final   Culture   Final    MULTIPLE SPECIES PRESENT, SUGGEST RECOLLECTION MULTIPLE ORGANISMS PRESENT, NONE PREDOMINANT    Report Status 01/09/2016 FINAL  Final  MRSA PCR Screening     Status: None   Collection Time: 01/07/16 11:25 PM  Result Value Ref Range Status   MRSA by PCR NEGATIVE NEGATIVE Final    Comment:        The GeneXpert MRSA Assay (FDA approved for NASAL specimens only),  is one component of a comprehensive MRSA colonization surveillance program. It is not intended to diagnose MRSA infection nor to guide or monitor treatment for MRSA infections.      Studies: No results found.  Scheduled Meds: . acetylcysteine  4 mL Nebulization BID  . allopurinol  100 mg Oral Daily  . budesonide (PULMICORT) nebulizer solution  0.25 mg Nebulization BID  . clonazePAM  0.25 mg Oral QHS  . cyanocobalamin  500 mcg Oral BID  . enoxaparin (LOVENOX) injection  40 mg Subcutaneous BID  . ferrous sulfate  325 mg Oral TID WC  . fluticasone  2 spray Each Nare Daily  . furosemide  20 mg Oral Daily  . gabapentin  300 mg Oral TID  . guaiFENesin  600 mg Oral BID  . ipratropium-albuterol  3 mL Nebulization Q6H  . irbesartan  150 mg Oral Daily  . levothyroxine  75 mcg Oral QAC breakfast  . magic mouthwash  10 mL Oral QID   And  . lidocaine  15 mL Mouth/Throat QID  . loratadine  10 mg Oral Daily  . methylPREDNISolone (SOLU-MEDROL) injection  40 mg Intravenous Daily  . modafinil  100 mg Oral Daily  . pantoprazole  40 mg Oral Daily  . PARoxetine  30 mg Oral Daily  . QUEtiapine  25 mg Oral QHS  . sodium chloride flush  3 mL Intravenous Q12H    Assessment/Plan:  1. Acute encephalopathy and fever. I will repeat her chest x-ray as well as obtain ABG to make sure she is not retaining carbon dioxide with her confusion 2. Acute bronchospasm continued therapy with Solu-Medrol and nebulizers. 3. Chronic Foley catheter. Foley catheter changed 4. Obesity weight loss advised 5. History of congestive heart failure- BNP in normal range no evidence of acute exasperation 6. Essential hypertension continue usual medications blood pressure stay 7. Hypothyroidism unspecified continue levothyroxine 8. Anxiety depression- continue psychiatric medications due to daytime sleepiness I will try some Provigil the morning 9. Gastroesophageal reflux disease without esophagitis on Protonix  Code  Status:     Code Status Orders        Start     Ordered   01/07/16 2226  Full code   Continuous     01/07/16 2225    Code Status History    Date Active Date Inactive Code Status Order ID Comments User Context   This patient has a current code status but no historical code status.     Family Communication: Daughter at the bedside Disposition Plan: Back to facility soon  Time spent: 20 minutes  Jamerius Boeckman, [facility]

## 2016-01-13 ENCOUNTER — Inpatient Hospital Stay: Payer: Medicare Other

## 2016-01-13 MED ORDER — IPRATROPIUM-ALBUTEROL 0.5-2.5 (3) MG/3ML IN SOLN: 3.0000 mL | Freq: Four times a day (QID) | RESPIRATORY_TRACT | Status: AC

## 2016-01-13 MED ORDER — CLONAZEPAM 0.5 MG PO TABS
0.5000 mg | ORAL_TABLET | Freq: Every day | ORAL | Status: DC
Start: 2016-01-13 — End: 2016-02-11

## 2016-01-13 MED ORDER — FLUTICASONE PROPIONATE 50 MCG/ACT NA SUSP: 2.0000 | Freq: Every day | NASAL | Status: AC

## 2016-01-13 MED ORDER — FUROSEMIDE 20 MG PO TABS: 40.0000 mg | ORAL_TABLET | Freq: Every day | ORAL | Status: AC

## 2016-01-13 MED ORDER — MODAFINIL 100 MG PO TABS: 100.0000 mg | ORAL_TABLET | Freq: Every day | ORAL | Status: DC

## 2016-01-13 NOTE — Discharge Instructions (Signed)
°  DIET:  Cardiac diet  DISCHARGE CONDITION:  Stable  ACTIVITY:  Activity as tolerated  OXYGEN:  Home Oxygen: Yes.     Oxygen Delivery: 2 liters/min via Patient connected to nasal cannula oxygen   CPAP QHS everynight  DISCHARGE LOCATION:  nursing home    ADDITIONAL DISCHARGE INSTRUCTION: need cpap at bedtime   If you experience worsening of your admission symptoms, develop shortness of breath, life threatening emergency, suicidal or homicidal thoughts you must seek medical attention immediately by calling 911 or calling your MD immediately  if symptoms less severe.  You Must read complete instructions/literature along with all the possible adverse reactions/side effects for all the Medicines you take and that have been prescribed to you. Take any new Medicines after you have completely understood and accpet all the possible adverse reactions/side effects.   Please note  You were cared for by a hospitalist during your hospital stay. If you have any questions about your discharge medications or the care you received while you were in the hospital after you are discharged, you can call the unit and asked to speak with the hospitalist on call if the hospitalist that took care of you is not available. Once you are discharged, your primary care physician will handle any further medical issues. Please note that NO REFILLS for any discharge medications will be authorized once you are discharged, as it is imperative that you return to your primary care physician (or establish a relationship with a primary care physician if you do not have one) for your aftercare needs so that they can reassess your need for medications and monitor your lab values.

## 2016-01-13 NOTE — NC FL2 (Signed)
[place] LEVEL OF CARE SCREENING TOOL     IDENTIFICATION  Patient Name: Kathy Guzman Birthdate: 10-02-1935 Sex: female Admission Date (Current Location): 01/07/2016  [facility] and [place] Number:  Selena Lesser  ([id number] [facility]) Facility and Address:  [facility], [address], [place], [place] [address]      Provider Number: [id number]  Attending Physician Name and Address:  Dustin Flock, MD  Relative Name and Phone Number:       Current Level of Care: Hospital Recommended Level of Care: [place] Prior Approval Number:    Date Approved/Denied:   PASRR Number:  ([id number])  Discharge Plan: SNF    Current Diagnoses: Patient Active Problem List   Diagnosis Date Noted  . Acute encephalopathy 01/10/2016  . Delirium 01/07/2016  . Acute cystitis without hematuria 01/07/2016  . Hyperkalemia 01/07/2016  . Hyponatremia 01/07/2016  . Anemia 01/07/2016  . Morbid obesity ([place]) 01/07/2016  . Body aches 01/07/2016  . Chronic diastolic heart failure ([place]) 03/19/2015  . COPD (chronic obstructive pulmonary disease) ([place]) 03/19/2015    Orientation RESPIRATION BLADDER Height & Weight     Time, Situation, Place, Self  O2 (Nasal Cannula 2 (L/min) ) Incontinent Weight: (!) 340 lb 12.8 oz (154.586 kg) Height:  5\' 2"  (157.5 cm)  BEHAVIORAL SYMPTOMS/MOOD NEUROLOGICAL BOWEL NUTRITION STATUS   (None)  (None) Incontinent Diet (Carb Modified )  AMBULATORY STATUS COMMUNICATION OF NEEDS Skin   Limited Assist Verbally Normal                       Personal Care Assistance Level of Assistance  Bathing, Feeding, Dressing Bathing Assistance: Limited assistance Feeding assistance: Limited assistance Dressing Assistance: Limited assistance     Functional Limitations Info  Sight, Hearing, Speech Sight Info: Adequate Hearing Info: Adequate Speech Info: Adequate    SPECIAL CARE FACTORS FREQUENCY                        Contractures      Additional Factors Info  Code Status, Allergies Code Status Info:  (Full Code) Allergies Info:  (Tramadol)           Current Medications (01/13/2016):  This is the current hospital active medication list Current Facility-Administered Medications  Medication Dose Route Frequency Provider Last Rate Last Dose  . acetaminophen (TYLENOL) tablet 500 mg  500 mg Oral TID PRN Theodoro Grist, MD   500 mg at 01/13/16 1006  . acetylcysteine (MUCOMYST) 20 % nebulizer / oral solution 4 mL  4 mL Nebulization BID Loletha Grayer, MD   4 mL at 01/13/16 0735  . albuterol (PROVENTIL) (2.5 MG/3ML) 0.083% nebulizer solution 3 mL  3 mL Inhalation Q4H PRN Theodoro Grist, MD   3 mL at 01/09/16 0343  . allopurinol (ZYLOPRIM) tablet 100 mg  100 mg Oral Daily Theodoro Grist, MD   100 mg at 01/13/16 0949  . budesonide (PULMICORT) nebulizer solution 0.25 mg  0.25 mg Nebulization BID Loletha Grayer, MD   0.25 mg at 01/13/16 0735  . calcium carbonate (TUMS - dosed in mg elemental calcium) chewable tablet 200 mg of elemental calcium  1 tablet Oral Q4H PRN Theodoro Grist, MD      . clonazePAM (KLONOPIN) tablet 0.25 mg  0.25 mg Oral QHS Theodoro Grist, MD   0.25 mg at 01/12/16 2114  . cyanocobalamin tablet 500 mcg  500 mcg Oral BID Theodoro Grist, MD   500 mcg at 01/13/16 0946  .  enoxaparin (LOVENOX) injection 40 mg  40 mg Subcutaneous BID Theodoro Grist, MD   40 mg at 01/13/16 0951  . ferrous sulfate tablet 325 mg  325 mg Oral TID WC Theodoro Grist, MD   325 mg at 01/13/16 0947  . fluticasone (FLONASE) 50 MCG/ACT nasal spray 2 spray  2 spray Each Nare Daily Dustin Flock, MD   2 spray at 01/13/16 1012  . furosemide (LASIX) tablet 20 mg  20 mg Oral Daily Theodoro Grist, MD   20 mg at 01/13/16 0950  . gabapentin (NEURONTIN) capsule 300 mg  300 mg Oral TID Theodoro Grist, MD   300 mg at 01/13/16 0951  . guaiFENesin (MUCINEX) 12 hr tablet 600 mg  600 mg Oral BID Theodoro Grist, MD   600 mg at 01/13/16 0951  .  ipratropium-albuterol (DUONEB) 0.5-2.5 (3) MG/3ML nebulizer solution 3 mL  3 mL Nebulization Q6H Loletha Grayer, MD   3 mL at 01/13/16 0735  . irbesartan (AVAPRO) tablet 150 mg  150 mg Oral Daily Theodoro Grist, MD   150 mg at 01/13/16 0950  . levothyroxine (SYNTHROID, LEVOTHROID) tablet 75 mcg  75 mcg Oral QAC breakfast Theodoro Grist, MD   75 mcg at 01/13/16 0505  . magic mouthwash  10 mL Oral QID Lenis Noon, RPH   10 mL at 01/13/16 1012   And  . lidocaine (XYLOCAINE) 2 % viscous mouth solution 15 mL  15 mL Mouth/Throat QID Lenis Noon, RPH   15 mL at 01/13/16 1012  . loratadine (CLARITIN) tablet 10 mg  10 mg Oral Daily Theodoro Grist, MD   10 mg at 01/13/16 0946  . methylPREDNISolone sodium succinate (SOLU-MEDROL) 40 mg/mL injection 40 mg  40 mg Intravenous Daily Loletha Grayer, MD   40 mg at 01/13/16 0946  . modafinil (PROVIGIL) tablet 100 mg  100 mg Oral Daily Dustin Flock, MD   100 mg at 01/13/16 0950  . nystatin cream (MYCOSTATIN) 1 application  1 application Topical [id number] PRN Theodoro Grist, MD      . ondansetron (ZOFRAN) tablet 4 mg  4 mg Oral Q6H PRN Theodoro Grist, MD       Or  . ondansetron (ZOFRAN) injection 4 mg  4 mg Intravenous Q6H PRN Theodoro Grist, MD      . pantoprazole (PROTONIX) EC tablet 40 mg  40 mg Oral Daily Theodoro Grist, MD   40 mg at 01/13/16 0950  . PARoxetine (PAXIL) tablet 30 mg  30 mg Oral Daily Theodoro Grist, MD   30 mg at 01/13/16 0947  . QUEtiapine (SEROQUEL) tablet 25 mg  25 mg Oral QHS Loletha Grayer, MD   25 mg at 01/12/16 2114  . sodium chloride flush (NS) 0.9 % injection 3 mL  3 mL Intravenous Q12H Theodoro Grist, MD   3 mL at 01/13/16 [id number]     Discharge Medications: Please see discharge summary for a list of discharge medications.  Relevant Imaging Results:  Relevant Lab Results:   Additional Information  (SSN [id number])  Loralyn Freshwater, LCSW

## 2016-01-13 NOTE — Progress Notes (Signed)
Report called to [employer] at [facility].  EMS notified for transport.  They were informed of pt's height and weight and need for transport with oxygen

## 2016-01-13 NOTE — Progress Notes (Signed)
Pt sent out via EMS with belongings

## 2016-01-13 NOTE — Progress Notes (Signed)
Patient is medically stable for D/C back to [facility] today. Per [facility] admissions coordinator at [facility] patient will return to room 206. RN will call report to 200 hall and arrange EMS for transport. Clinical Education officer, museum (CSW) sent D/C Summary, FL2 and D/C Packet to [employer] via [employer]. Patient is aware of above. Patient's daughter Jackelyn Poling was at bedside and aware of above. Please reconsult if future social work needs arise. CSW signing off.   Blima Rich, LCSW [phone]

## 2016-01-13 NOTE — Discharge Summary (Signed)
Kathy Guzman, 81 y.o., DOB January 28, 1935, MRN [id number]. Admission date: 01/07/2016 Discharge Date 01/13/2016 Primary MD Margarita Rana, MD Admitting Physician Theodoro Grist, MD  Admission Diagnosis  Confusion [R41.0] UTI (lower urinary tract infection) [N39.0] Altered mental status, unspecified altered mental status type [R41.82]  Discharge Diagnosis   Principal Problem:   Delirium due to urinary tract    Acute cystitis without hematuria   Hyperkalemia   Hyponatremia   Anemia   Morbid obesity ([place])   Suspected sleep apnea need CPAP History of congestive heart failure unspecified type Hypertension Hyperlipidemia COPD with chronic respiratory GERD Hypothyroidism OA Depression  Anxiety        Hospital Course Kathy Guzman is a 81 y.o. female with a known history of essential hypertension, morbid obesity, chronic indwelling catheter due to urinary retention who presents to the hospital with confusion. Patient was noted to have UTI however her urine cultures were negative. Patient was treated with antibiotics. Her mental status is waxing and waning. I checked an ABG which did reveal PCO2 was elevated. I suspect she has sleep apnea and will need CPAP at bedtime. Patient's was also seen by infectious disease prolactin level was checked her antibiotics have been discontinued. Patient is due to status is back to baseline. In doing better. She is stable for discharge.            Consults infectious disease  Significant Tests:  See full reports for all details    Dg Chest 1 View  01/13/2016  CLINICAL DATA:  Shortness of breath today.  Initial encounter. EXAM: CHEST 1 VIEW COMPARISON:  Single-view of the chest 01/07/2016 and 01/09/2016. FINDINGS: There is cardiomegaly and mild vascular congestion. Aeration appears somewhat improved compared to the most recent study. Left basilar atelectasis is noted. No pneumothorax. IMPRESSION: Cardiomegaly and vascular congestion. Aeration  appears mildly improved since the most recent study. Electronically Signed   By: Inge Rise M.D.   On: 01/13/2016 07:47   Dg Chest 1 View  01/09/2016  CLINICAL DATA:  Productive cough an audible wheezing for 1 day. EXAM: CHEST 1 VIEW COMPARISON:  01/07/2016 FINDINGS: Mild enlargement of the cardiopericardial silhouette, stable. No mediastinal or hilar masses or evidence of adenopathy. Mild central vascular congestion and interstitial thickening. No focal consolidation is seen to suggest pneumonia. No convincing pleural effusion or pneumothorax. IMPRESSION: 1. Cardiomegaly with mild central vascular congestion interstitial thickening. Findings suggest mild congestive heart failure. No convincing pneumonia. Electronically Signed   By: Lajean Manes M.D.   On: 01/09/2016 11:37   Ct Head Wo Contrast  01/07/2016  CLINICAL DATA:  Altered mental status EXAM: CT HEAD WITHOUT CONTRAST TECHNIQUE: Contiguous axial images were obtained from the base of the skull through the vertex without intravenous contrast. COMPARISON:  02/13/2009 FINDINGS: Motion degraded images. No evidence of parenchymal hemorrhage or extra-axial fluid collection. No mass lesion, mass effect, or midline shift. No CT evidence of acute infarction. Subcortical white matter and periventricular small vessel ischemic changes. Global cortical atrophy.  No ventriculomegaly. The visualized paranasal sinuses are essentially clear. The mastoid air cells are unopacified. No evidence of calvarial fracture. IMPRESSION: No evidence of acute intracranial abnormality. Mild atrophy with small vessel ischemic changes. Electronically Signed   By: Julian Hy M.D.   On: 01/07/2016 22:03   Dg Chest Portable 1 View  01/07/2016  CLINICAL DATA:  Altered mental status. No reported shortness of breath. EXAM: PORTABLE CHEST 1 VIEW COMPARISON:  06/10/2015 FINDINGS: Mild enlargement of the cardiopericardial silhouette, stable.  No mediastinal or hilar masses or  convincing adenopathy. Lungs are clear.  No pleural effusion or pneumothorax. Bony thorax is demineralized. Right humeral head appears subluxed inferiorly in relation to the glenoid, unchanged from the prior chest radiograph. IMPRESSION: No acute cardiopulmonary disease. Electronically Signed   By: Lajean Manes M.D.   On: 01/07/2016 19:01       Today   Subjective:   Kathy Guzman much more awake feels better complaints of some bladder spasms  Objective:   Blood pressure 132/55, pulse 79, temperature 98.3 F (36.8 C), temperature source Oral, resp. rate 18, height 5\' 2"  (1.575 m), weight 154.586 kg (340 lb 12.8 oz), SpO2 96 %.  .  Intake/Output Summary (Last 24 hours) at 01/13/16 1109 Last data filed at 01/13/16 0900  Gross per 24 hour  Intake    480 ml  Output   1050 ml  Net   -570 ml    Exam VITAL SIGNS: Blood pressure 132/55, pulse 79, temperature 98.3 F (36.8 C), temperature source Oral, resp. rate 18, height 5\' 2"  (1.575 m), weight 154.586 kg (340 lb 12.8 oz), SpO2 96 %.  GENERAL:  81 y.o.-year-old patient lying in the bed with no acute distress.  EYES: Pupils equal, round, reactive to light and accommodation. No scleral icterus. Extraocular muscles intact.  HEENT: Head atraumatic, normocephalic. Oropharynx and nasopharynx clear.  NECK:  Supple, no jugular venous distention. No thyroid enlargement, no tenderness.  LUNGS: Normal breath sounds bilaterally, no wheezing, rales,rhonchi or crepitation. No use of accessory muscles of respiration.  CARDIOVASCULAR: S1, S2 normal. No murmurs, rubs, or gallops.  ABDOMEN: Soft, nontender, nondistended. Bowel sounds present. No organomegaly or mass.  EXTREMITIES: No pedal edema, cyanosis, or clubbing.  NEUROLOGIC: Cranial nerves II through XII are intact. Muscle strength 5/5 in all extremities. Sensation intact. Gait not checked.  PSYCHIATRIC: The patient is alert and oriented x 3.  SKIN: No obvious rash, lesion, or ulcer.   Data  Review     CBC w Diff: Lab Results  Component Value Date   WBC 9.5 01/08/2016   WBC 10.8 09/04/2014   HGB 10.6* 01/08/2016   HGB 8.1* 09/04/2014   HCT 31.7* 01/08/2016   HCT 26.6* 09/04/2014   PLT 235 01/08/2016   PLT 388 09/04/2014   LYMPHOPCT 21 01/07/2016   LYMPHOPCT 33.2 09/04/2014   MONOPCT 11 01/07/2016   MONOPCT 8.3 09/04/2014   MONOPCT 5 10/24/2013   EOSPCT 15 01/07/2016   EOSPCT 5.8 09/04/2014   BASOPCT 1 01/07/2016   BASOPCT 0.4 09/04/2014   CMP: Lab Results  Component Value Date   NA 135 01/08/2016   NA 137 09/04/2014   K 4.3 01/08/2016   K 4.1 09/04/2014   CL 98* 01/08/2016   CL 102 09/04/2014   CO2 26 01/08/2016   CO2 31 09/04/2014   BUN 18 01/08/2016   BUN 17 09/04/2014   CREATININE 0.81 01/11/2016   CREATININE 0.98 09/04/2014   PROT 7.0 06/10/2015   PROT 6.6 09/01/2014   ALBUMIN 3.0* 06/10/2015   ALBUMIN 2.7* 09/01/2014   BILITOT 0.2* 06/10/2015   BILITOT 0.3 09/01/2014   ALKPHOS 97 06/10/2015   ALKPHOS 100 09/01/2014   AST 16 06/10/2015   AST 14* 09/01/2014   ALT 14 06/10/2015   ALT 11* 09/01/2014  .  Micro Results Recent Results (from the past 240 hour(s))  Culture, sputum-assessment     Status: None   Collection Time: 01/07/16  2:47 PM  Result Value Ref Range Status  Specimen Description SPUTUM  Final   Special Requests SPUTUM  Final   Sputum evaluation THIS SPECIMEN IS ACCEPTABLE FOR SPUTUM CULTURE  Final   Report Status 01/10/2016 FINAL  Final  Culture, respiratory (NON-Expectorated)     Status: None   Collection Time: 01/07/16  2:47 PM  Result Value Ref Range Status   Specimen Description SPUTUM  Final   Special Requests SPUTUM Reflexed from [id number]  Final   Gram Stain   Final    GOOD SPECIMEN - 80-90% WBCS MODERATE WBC SEEN RARE YEAST RARE GRAM POSITIVE COCCI    Culture Consistent with normal respiratory flora.  Final   Report Status 01/11/2016 FINAL  Final  Urine culture     Status: None   Collection Time: 01/07/16   6:33 PM  Result Value Ref Range Status   Specimen Description URINE, RANDOM  Final   Special Requests NONE  Final   Culture   Final    MULTIPLE SPECIES PRESENT, SUGGEST RECOLLECTION MULTIPLE ORGANISMS PRESENT, NONE PREDOMINANT    Report Status 01/09/2016 FINAL  Final  MRSA PCR Screening     Status: None   Collection Time: 01/07/16 11:25 PM  Result Value Ref Range Status   MRSA by PCR NEGATIVE NEGATIVE Final    Comment:        The GeneXpert MRSA Assay (FDA approved for NASAL specimens only), is one component of a comprehensive MRSA colonization surveillance program. It is not intended to diagnose MRSA infection nor to guide or monitor treatment for MRSA infections.         Code Status Orders        Start     Ordered   01/07/16 2226  Full code   Continuous     01/07/16 2225    Code Status History    Date Active Date Inactive Code Status Order ID Comments User Context   This patient has a current code status but no historical code status.          Follow-up Information    Follow up with [facility].   Specialty:  [place] information:   [place] [phone]      Follow up with Margarita Rana, MD In 7 days.   Specialty:  Family Medicine   Contact information:   [address] [place] [address] [phone]       Follow up with [facility] In 10 days.   Why:  sleep apena eval      Discharge Medications     Medication List    STOP taking these medications        HYDROcodone-acetaminophen 5-325 MG tablet  Commonly known as:  NORCO/VICODIN     hydrOXYzine 25 MG tablet  Commonly known as:  ATARAX/VISTARIL      TAKE these medications        acetaminophen 500 MG tablet  Commonly known as:  TYLENOL  Take 500 mg by mouth 3 (three) times daily as needed for mild pain.     albuterol 108 (90 Base) MCG/ACT inhaler  Commonly known  as:  PROVENTIL HFA;VENTOLIN HFA  Inhale 2 puffs into the lungs every 4 (four) hours as needed for wheezing or shortness of breath.     allopurinol 100 MG tablet  Commonly known as:  ZYLOPRIM  Take 100 mg by mouth daily.     budesonide-formoterol 160-4.5 MCG/ACT inhaler  Commonly known as:  SYMBICORT  Inhale 2 puffs into the lungs 2 (two) times daily.     CALAZIME SKIN PROTECTANT EX  Apply 1 application topically 4 (four) times daily as needed (for rash). Pt applies to buttocks.     clonazePAM 0.5 MG tablet  Commonly known as:  KLONOPIN  Take 1 tablet (0.5 mg total) by mouth at bedtime.     cyanocobalamin 500 MCG tablet  Take 500 mcg by mouth 2 (two) times daily.     EUCERIN EX  Apply 1 application topically 2 (two) times daily.     ferrous sulfate 325 (65 FE) MG tablet  Take 325 mg by mouth 3 (three) times daily with meals.     fluticasone 50 MCG/ACT nasal spray  Commonly known as:  FLONASE  Place 2 sprays into both nostrils daily.     furosemide 20 MG tablet  Commonly known as:  LASIX  Take 2 tablets (40 mg total) by mouth daily.     gabapentin 300 MG capsule  Commonly known as:  NEURONTIN  Take 300 mg by mouth 3 (three) times daily.     guaiFENesin-dextromethorphan 100-10 MG/5ML syrup  Commonly known as:  ROBITUSSIN DM  Take 10 mLs by mouth every 4 (four) hours as needed for cough.     ipratropium-albuterol 0.5-2.5 (3) MG/3ML Soln  Commonly known as:  DUONEB  Take 3 mLs by nebulization every 6 (six) hours.     levothyroxine 75 MCG tablet  Commonly known as:  SYNTHROID, LEVOTHROID  Take 75 mcg by mouth daily before breakfast.     loratadine 10 MG tablet  Commonly known as:  CLARITIN  Take 10 mg by mouth daily.     modafinil 100 MG tablet  Commonly known as:  PROVIGIL  Take 1 tablet (100 mg total) by mouth daily.     nystatin cream  Commonly known as:  MYCOSTATIN  Apply 1 application topically every 8 (eight) hours as needed (for yeast). Pt applies to all  skin folds and groin.     omeprazole 20 MG capsule  Commonly known as:  PRILOSEC  Take 20 mg by mouth daily.     oxybutynin 5 MG tablet  Commonly known as:  DITROPAN  Take 5 mg by mouth every 8 (eight) hours as needed for bladder spasms.     PARoxetine 30 MG tablet  Commonly known as:  PAXIL  Take 30 mg by mouth daily.     QUEtiapine 25 MG tablet  Commonly known as:  SEROQUEL  Take 1 tablet (25 mg total) by mouth at bedtime.     tiotropium 18 MCG inhalation capsule  Commonly known as:  SPIRIVA  Place 18 mcg into inhaler and inhale daily.     TUMS 500 MG chewable tablet  Generic drug:  calcium carbonate  Chew 1 tablet by mouth every 4 (four) hours as needed for indigestion or heartburn.     valsartan 160 MG tablet  Commonly known as:  DIOVAN  Take 160 mg by mouth daily.     Vitamin D (Ergocalciferol) 50000 units Caps capsule  Commonly known as:  DRISDOL  Take 50,000 Units by mouth every 30 (thirty) days.           Total Time in preparing paper work, data evaluation and todays exam - 35 minutes  Dustin Flock M.D on 01/13/2016 at 11:09 AM  [facility]   Office  [phone]

## 2016-02-07 ENCOUNTER — Inpatient Hospital Stay
Admission: EM | Admit: 2016-02-07 | Discharge: 2016-02-11 | DRG: 698 | Disposition: A | Discharge status: Skilled Nursing Facility | Payer: Medicare Other | Attending: Internal Medicine | Admitting: Internal Medicine

## 2016-02-07 ENCOUNTER — Emergency Department: Payer: Medicare Other

## 2016-02-07 ENCOUNTER — Encounter: Payer: Self-pay | Admitting: Emergency Medicine

## 2016-02-07 DIAGNOSIS — K219 Gastro-esophageal reflux disease without esophagitis: Secondary | ICD-10-CM | POA: Diagnosis present

## 2016-02-07 DIAGNOSIS — Z87891 Personal history of nicotine dependence: Secondary | ICD-10-CM

## 2016-02-07 DIAGNOSIS — I5032 Chronic diastolic (congestive) heart failure: Secondary | ICD-10-CM | POA: Diagnosis present

## 2016-02-07 DIAGNOSIS — G4733 Obstructive sleep apnea (adult) (pediatric): Secondary | ICD-10-CM | POA: Diagnosis present

## 2016-02-07 DIAGNOSIS — I110 Hypertensive heart disease with heart failure: Secondary | ICD-10-CM | POA: Diagnosis present

## 2016-02-07 DIAGNOSIS — Z6841 Body Mass Index (BMI) 40.0 and over, adult: Secondary | ICD-10-CM | POA: Diagnosis not present

## 2016-02-07 DIAGNOSIS — N3289 Other specified disorders of bladder: Secondary | ICD-10-CM | POA: Diagnosis present

## 2016-02-07 DIAGNOSIS — R6520 Severe sepsis without septic shock: Secondary | ICD-10-CM | POA: Diagnosis present

## 2016-02-07 DIAGNOSIS — I509 Heart failure, unspecified: Secondary | ICD-10-CM | POA: Diagnosis not present

## 2016-02-07 DIAGNOSIS — Z800 Family history of malignant neoplasm of digestive organs: Secondary | ICD-10-CM

## 2016-02-07 DIAGNOSIS — Z7401 Bed confinement status: Secondary | ICD-10-CM | POA: Diagnosis not present

## 2016-02-07 DIAGNOSIS — J9600 Acute respiratory failure, unspecified whether with hypoxia or hypercapnia: Secondary | ICD-10-CM

## 2016-02-07 DIAGNOSIS — Z66 Do not resuscitate: Secondary | ICD-10-CM | POA: Diagnosis present

## 2016-02-07 DIAGNOSIS — Y846 Urinary catheterization as the cause of abnormal reaction of the patient, or of later complication, without mention of misadventure at the time of the procedure: Secondary | ICD-10-CM | POA: Diagnosis present

## 2016-02-07 DIAGNOSIS — G9341 Metabolic encephalopathy: Secondary | ICD-10-CM | POA: Diagnosis present

## 2016-02-07 DIAGNOSIS — J9621 Acute and chronic respiratory failure with hypoxia: Secondary | ICD-10-CM | POA: Diagnosis present

## 2016-02-07 DIAGNOSIS — E039 Hypothyroidism, unspecified: Secondary | ICD-10-CM | POA: Diagnosis present

## 2016-02-07 DIAGNOSIS — Z1621 Resistance to vancomycin: Secondary | ICD-10-CM | POA: Diagnosis present

## 2016-02-07 DIAGNOSIS — J9622 Acute and chronic respiratory failure with hypercapnia: Secondary | ICD-10-CM | POA: Diagnosis present

## 2016-02-07 DIAGNOSIS — M1990 Unspecified osteoarthritis, unspecified site: Secondary | ICD-10-CM | POA: Diagnosis present

## 2016-02-07 DIAGNOSIS — E871 Hypo-osmolality and hyponatremia: Secondary | ICD-10-CM | POA: Diagnosis present

## 2016-02-07 DIAGNOSIS — J449 Chronic obstructive pulmonary disease, unspecified: Secondary | ICD-10-CM

## 2016-02-07 DIAGNOSIS — Z90711 Acquired absence of uterus with remaining cervical stump: Secondary | ICD-10-CM

## 2016-02-07 DIAGNOSIS — E669 Obesity, unspecified: Secondary | ICD-10-CM

## 2016-02-07 DIAGNOSIS — H9190 Unspecified hearing loss, unspecified ear: Secondary | ICD-10-CM | POA: Diagnosis present

## 2016-02-07 DIAGNOSIS — Z79899 Other long term (current) drug therapy: Secondary | ICD-10-CM | POA: Diagnosis not present

## 2016-02-07 DIAGNOSIS — Z9981 Dependence on supplemental oxygen: Secondary | ICD-10-CM | POA: Diagnosis not present

## 2016-02-07 DIAGNOSIS — Z888 Allergy status to other drugs, medicaments and biological substances status: Secondary | ICD-10-CM | POA: Diagnosis not present

## 2016-02-07 DIAGNOSIS — N358 Other urethral stricture: Secondary | ICD-10-CM | POA: Diagnosis present

## 2016-02-07 DIAGNOSIS — A4181 Sepsis due to Enterococcus: Secondary | ICD-10-CM | POA: Diagnosis present

## 2016-02-07 DIAGNOSIS — Z7951 Long term (current) use of inhaled steroids: Secondary | ICD-10-CM

## 2016-02-07 DIAGNOSIS — A419 Sepsis, unspecified organism: Secondary | ICD-10-CM | POA: Diagnosis present

## 2016-02-07 DIAGNOSIS — T83511A Infection and inflammatory reaction due to indwelling urethral catheter, initial encounter: Principal | ICD-10-CM | POA: Diagnosis present

## 2016-02-07 DIAGNOSIS — I959 Hypotension, unspecified: Secondary | ICD-10-CM | POA: Diagnosis present

## 2016-02-07 DIAGNOSIS — E875 Hyperkalemia: Secondary | ICD-10-CM | POA: Diagnosis present

## 2016-02-07 DIAGNOSIS — Z9289 Personal history of other medical treatment: Secondary | ICD-10-CM | POA: Diagnosis not present

## 2016-02-07 DIAGNOSIS — N390 Urinary tract infection, site not specified: Secondary | ICD-10-CM | POA: Diagnosis present

## 2016-02-07 DIAGNOSIS — R410 Disorientation, unspecified: Secondary | ICD-10-CM | POA: Diagnosis not present

## 2016-02-07 DIAGNOSIS — Z9049 Acquired absence of other specified parts of digestive tract: Secondary | ICD-10-CM

## 2016-02-07 DIAGNOSIS — Z85828 Personal history of other malignant neoplasm of skin: Secondary | ICD-10-CM

## 2016-02-07 DIAGNOSIS — Z87440 Personal history of urinary (tract) infections: Secondary | ICD-10-CM | POA: Diagnosis not present

## 2016-02-07 DIAGNOSIS — L8990 Pressure ulcer of unspecified site, unspecified stage: Secondary | ICD-10-CM | POA: Diagnosis present

## 2016-02-07 DIAGNOSIS — M109 Gout, unspecified: Secondary | ICD-10-CM | POA: Diagnosis present

## 2016-02-07 DIAGNOSIS — R4182 Altered mental status, unspecified: Secondary | ICD-10-CM

## 2016-02-07 DIAGNOSIS — Z8249 Family history of ischemic heart disease and other diseases of the circulatory system: Secondary | ICD-10-CM

## 2016-02-07 DIAGNOSIS — J440 Chronic obstructive pulmonary disease with acute lower respiratory infection: Secondary | ICD-10-CM | POA: Diagnosis present

## 2016-02-07 DIAGNOSIS — N179 Acute kidney failure, unspecified: Secondary | ICD-10-CM | POA: Diagnosis present

## 2016-02-07 DIAGNOSIS — R5383 Other fatigue: Secondary | ICD-10-CM

## 2016-02-07 DIAGNOSIS — R7881 Bacteremia: Secondary | ICD-10-CM | POA: Diagnosis not present

## 2016-02-07 HISTORY — DX: Gout, unspecified: M10.9

## 2016-02-07 HISTORY — DX: Urinary tract infection, site not specified: N39.0

## 2016-02-07 HISTORY — DX: Unspecified urethral stricture, male, unspecified site: N35.919

## 2016-02-07 LAB — CBC WITH DIFFERENTIAL/PLATELET
Basophils Absolute: 0 10*3/uL (ref 0–0.1)
Basophils Relative: 0 %
EOS PCT: 3 %
Eosinophils Absolute: 0.6 10*3/uL (ref 0–0.7)
HCT: 33.2 % — ABNORMAL LOW (ref 35.0–47.0)
Hemoglobin: 10.9 g/dL — ABNORMAL LOW (ref 12.0–16.0)
LYMPHS ABS: 1.9 10*3/uL (ref 1.0–3.6)
Lymphocytes Relative: 9 %
MCH: 30.6 pg (ref 26.0–34.0)
MCHC: 32.9 g/dL (ref 32.0–36.0)
MCV: 93.1 fL (ref 80.0–100.0)
MONOS PCT: 4 %
Monocytes Absolute: 0.8 10*3/uL (ref 0.2–0.9)
NEUTROS ABS: 17.7 10*3/uL — AB (ref 1.4–6.5)
Neutrophils Relative %: 84 %
PLATELETS: 210 10*3/uL (ref 150–440)
RBC: 3.57 MIL/uL — ABNORMAL LOW (ref 3.80–5.20)
RDW: 15 % — AB (ref 11.5–14.5)
WBC: 21 10*3/uL — ABNORMAL HIGH (ref 3.6–11.0)

## 2016-02-07 LAB — BASIC METABOLIC PANEL
Anion gap: 9 (ref 5–15)
BUN: 38 mg/dL — AB (ref 6–20)
CALCIUM: 8.9 mg/dL (ref 8.9–10.3)
CO2: 24 mmol/L (ref 22–32)
CREATININE: 1.69 mg/dL — AB (ref 0.44–1.00)
Chloride: 91 mmol/L — ABNORMAL LOW (ref 101–111)
GFR calc Af Amer: 32 mL/min — ABNORMAL LOW (ref >60)
GFR, EST NON AFRICAN AMERICAN: 28 mL/min — AB (ref >60)
GLUCOSE: 151 mg/dL — AB (ref 65–99)
POTASSIUM: 5.1 mmol/L (ref 3.5–5.1)
Sodium: 124 mmol/L — ABNORMAL LOW (ref 135–145)

## 2016-02-07 LAB — BLOOD GAS, VENOUS
Acid-base deficit: 3.5 mmol/L — ABNORMAL HIGH (ref 0.0–2.0)
Acid-base deficit: 2.1 mmol/L — ABNORMAL HIGH (ref 0.0–2.0)
BICARBONATE: 25.9 meq/L (ref 21.0–28.0)
Bicarbonate: 24.7 mEq/L (ref 21.0–28.0)
DELIVERY SYSTEMS: POSITIVE
FIO2: 28
FIO2: 0.28
O2 SAT: 54.5 %
O2 Saturation: 61.7 %
PCO2 VEN: 59 mmHg (ref 44.0–60.0)
PCO2 VEN: 59 mmHg (ref 44.0–60.0)
PH VEN: 7.25 — AB (ref 7.320–7.430)
PO2 VEN: 35 mmHg (ref 31.0–45.0)
PO2 VEN: 38 mmHg (ref 31.0–45.0)
Patient temperature: 37
Patient temperature: 37
pH, Ven: 7.23 — ABNORMAL LOW (ref 7.320–7.430)

## 2016-02-07 LAB — PROCALCITONIN: Procalcitonin: 0.2 ng/mL

## 2016-02-07 LAB — LACTIC ACID, PLASMA
Lactic Acid, Venous: 2.3 mmol/L (ref 0.5–2.0)
Lactic Acid, Venous: 1.9 mmol/L (ref 0.5–2.0)

## 2016-02-07 LAB — TROPONIN I: Troponin I: <0.03 ng/mL (ref <0.031)

## 2016-02-07 MED ORDER — ENOXAPARIN SODIUM 40 MG/0.4ML ~~LOC~~ SOLN: 40.0000 mg | SUBCUTANEOUS | Status: DC

## 2016-02-07 MED ORDER — MORPHINE SULFATE (PF) 2 MG/ML IV SOLN: 2.0000 mg | INTRAVENOUS | Status: DC | PRN

## 2016-02-07 MED ORDER — SODIUM CHLORIDE 0.9% FLUSH
3.0000 mL | Freq: Two times a day (BID) | INTRAVENOUS | Status: DC
  Administered 2016-02-08 – 2016-02-11 (×5): 3 mL via INTRAVENOUS

## 2016-02-07 MED ORDER — SODIUM CHLORIDE 0.9 % IV BOLUS (SEPSIS): 1000.0000 mL | INTRAVENOUS | Status: DC

## 2016-02-07 MED ORDER — VANCOMYCIN HCL IN DEXTROSE 1-5 GM/200ML-% IV SOLN: 1000.0000 mg | Freq: Once | INTRAVENOUS | Status: DC

## 2016-02-07 MED ORDER — ENOXAPARIN SODIUM 40 MG/0.4ML ~~LOC~~ SOLN
40.0000 mg | SUBCUTANEOUS | Status: DC
Start: 2016-02-07 — End: 2016-02-08
  Administered 2016-02-08: 40 mg via SUBCUTANEOUS
  Filled 2016-02-07: qty 0.4

## 2016-02-07 MED ORDER — PIPERACILLIN-TAZOBACTAM 3.375 G IVPB 30 MIN
3.3750 g | Freq: Once | INTRAVENOUS | Status: AC
  Administered 2016-02-07: 3.375 g via INTRAVENOUS
  Filled 2016-02-07: qty 50

## 2016-02-07 MED ORDER — POLYETHYLENE GLYCOL 3350 17 G PO PACK: 17.0000 g | PACK | Freq: Every day | ORAL | Status: DC | PRN

## 2016-02-07 MED ORDER — ONDANSETRON HCL 4 MG/2ML IJ SOLN: 4.0000 mg | Freq: Four times a day (QID) | INTRAMUSCULAR | Status: DC | PRN

## 2016-02-07 MED ORDER — BISACODYL 5 MG PO TBEC: 5.0000 mg | DELAYED_RELEASE_TABLET | Freq: Every day | ORAL | Status: DC | PRN

## 2016-02-07 MED ORDER — SODIUM CHLORIDE 0.9 % IV SOLN
INTRAVENOUS | Status: DC
  Administered 2016-02-07 – 2016-02-10 (×3): via INTRAVENOUS

## 2016-02-07 MED ORDER — SODIUM CHLORIDE 0.9 % IV BOLUS (SEPSIS): 1000.0000 mL | INTRAVENOUS | Status: AC

## 2016-02-07 MED ORDER — ALBUTEROL SULFATE (2.5 MG/3ML) 0.083% IN NEBU
2.5000 mg | INHALATION_SOLUTION | Freq: Once | RESPIRATORY_TRACT | Status: AC
  Administered 2016-02-07: 2.5 mg via RESPIRATORY_TRACT
  Filled 2016-02-07: qty 3

## 2016-02-07 MED ORDER — IPRATROPIUM-ALBUTEROL 0.5-2.5 (3) MG/3ML IN SOLN: 3.0000 mL | RESPIRATORY_TRACT | Status: DC | PRN

## 2016-02-07 MED ORDER — PIPERACILLIN-TAZOBACTAM 3.375 G IVPB
3.3750 g | Freq: Three times a day (TID) | INTRAVENOUS | Status: DC
  Administered 2016-02-08 (×2): 3.375 g via INTRAVENOUS
  Filled 2016-02-07 (×4): qty 50

## 2016-02-07 MED ORDER — DOCUSATE SODIUM 100 MG PO CAPS
100.0000 mg | ORAL_CAPSULE | Freq: Two times a day (BID) | ORAL | Status: DC
  Administered 2016-02-08 – 2016-02-11 (×4): 100 mg via ORAL
  Filled 2016-02-07 (×5): qty 1

## 2016-02-07 MED ORDER — ONDANSETRON HCL 4 MG PO TABS: 4.0000 mg | ORAL_TABLET | Freq: Four times a day (QID) | ORAL | Status: DC | PRN

## 2016-02-07 NOTE — Consult Note (Addendum)
PULMONARY / CRITICAL CARE MEDICINE   Name: Kathy Guzman MRN: [id number] DOB: 01-27-1935    ADMISSION DATE:  02/07/2016 CONSULTATION DATE:  02/07/16  REFERRING MD :  Dr. Clearnce Hasten   CHIEF COMPLAINT:     Sob/lethargy   HISTORY OF PRESENT ILLNESS   81 year old female past medical history of CHF, morbid obesity, hypertension, COPD, GERD, osteoarthritis, suspected OSA (no formal diagnosis), chronic indwelling foley catheter, recent admission for UTI, now presenting with shortness of breath lethargy 3 days. History per daughter is at bedside. Patient lives in a nursing facility, daughter have noticed that patient has lethargy and mild shortness of breath over the last 3 days, also stated with questionable fever and chills. Review of chart shows that she was recently in the hospital in mid March for a similar clinical presentation, was treated with antibiotics for suspected UTI/CAP. Currently her systolics are greater than 90, she is not requiring significant amounts of oxygen, her VBG does show mild acidosis however it is within acceptable limits.   SIGNIFICANT EVENTS  3/16 - dc'ed from [facility] for suspected UTI.     PAST MEDICAL HISTORY    :  Past Medical History  Diagnosis Date  . CHF (congestive heart failure) ([place])   . Hypertension   . Hyperlipidemia   . COPD (chronic obstructive pulmonary disease) ([place])   . GERD (gastroesophageal reflux disease)   . Hypothyroid   . Osteoarthritis   . Depression   . Anxiety   . Skin cancer    Past Surgical History  Procedure Laterality Date  . Partial hysterectomy    . Cholecystectomy     Prior to Admission medications   Medication Sig Start Date End Date Taking? Authorizing Provider  acetaminophen (TYLENOL) 500 MG tablet Take 500 mg by mouth every 8 (eight) hours as needed for mild pain.    Yes Historical Provider, MD  albuterol (PROVENTIL HFA;VENTOLIN HFA) 108 (90 BASE) MCG/ACT inhaler Inhale 2 puffs into the lungs every 4 (four)  hours as needed for wheezing or shortness of breath.   Yes Historical Provider, MD  allopurinol (ZYLOPRIM) 100 MG tablet Take 100 mg by mouth daily.    Yes Historical Provider, MD  budesonide-formoterol (SYMBICORT) 160-4.5 MCG/ACT inhaler Inhale 2 puffs into the lungs 2 (two) times daily.   Yes Historical Provider, MD  calcium carbonate (TUMS) 500 MG chewable tablet Chew 1 tablet by mouth every 4 (four) hours as needed for indigestion or heartburn.   Yes Historical Provider, MD  clonazePAM (KLONOPIN) 0.5 MG tablet Take 1 tablet (0.5 mg total) by mouth at bedtime. 01/13/16  Yes Dustin Flock, MD  cyanocobalamin 500 MCG tablet Take 500 mcg by mouth 2 (two) times daily.   Yes Historical Provider, MD  ertapenem (INVANZ) 1 g injection Inject 1 g into the muscle daily. 01/31/16 02/10/16 Yes Historical Provider, MD  ferrous sulfate 325 (65 FE) MG tablet Take 325 mg by mouth daily with breakfast.    Yes Historical Provider, MD  fluticasone (FLONASE) 50 MCG/ACT nasal spray Place 2 sprays into both nostrils daily. 01/13/16  Yes Dustin Flock, MD  furosemide (LASIX) 20 MG tablet Take 2 tablets (40 mg total) by mouth daily. 01/13/16  Yes Dustin Flock, MD  gabapentin (NEURONTIN) 300 MG capsule Take 300 mg by mouth 3 (three) times daily.   Yes Historical Provider, MD  guaiFENesin-dextromethorphan (ROBITUSSIN DM) 100-10 MG/5ML syrup Take 10 mLs by mouth every 4 (four) hours as needed for cough.   Yes Historical Provider, MD  ipratropium-albuterol (DUONEB) 0.5-2.5 (3) MG/3ML SOLN Take 3 mLs by nebulization every 6 (six) hours. 01/13/16  Yes Dustin Flock, MD  levothyroxine (SYNTHROID, LEVOTHROID) 75 MCG tablet Take 75 mcg by mouth daily before breakfast.   Yes Historical Provider, MD  loratadine (CLARITIN) 10 MG tablet Take 10 mg by mouth daily.   Yes Historical Provider, MD  nystatin cream (MYCOSTATIN) Apply 1 application topically every 8 (eight) hours as needed (for yeast). Pt applies to all skin folds.   Yes  Historical Provider, MD  omeprazole (PRILOSEC) 20 MG capsule Take 20 mg by mouth daily.    Yes Historical Provider, MD  PARoxetine (PAXIL) 30 MG tablet Take 30 mg by mouth daily.   Yes Historical Provider, MD  QUEtiapine (SEROQUEL) 25 MG tablet Take 50 mg by mouth at bedtime.   Yes Historical Provider, MD  Skin Protectants, Misc. (CALAZIME SKIN PROTECTANT EX) Apply 1 application topically 4 (four) times daily as needed (for rash). Pt applies to buttocks.   Yes Historical Provider, MD  Skin Protectants, Misc. (EUCERIN) cream Apply 1 application topically 2 (two) times daily.   Yes Historical Provider, MD  tiotropium (SPIRIVA) 18 MCG inhalation capsule Place 18 mcg into inhaler and inhale daily.   Yes Historical Provider, MD  valsartan (DIOVAN) 160 MG tablet Take 160 mg by mouth daily.   Yes Historical Provider, MD  Vitamin D, Ergocalciferol, (DRISDOL) 50000 units CAPS capsule Take 50,000 Units by mouth every 30 (thirty) days.   Yes Historical Provider, MD  modafinil (PROVIGIL) 100 MG tablet Take 1 tablet (100 mg total) by mouth daily. Patient not taking: Reported on 02/07/2016 01/13/16   Dustin Flock, MD   Allergies  Allergen Reactions  . Nsaids Other (See Comments)    Reaction:  Unknown   . Tramadol Itching     FAMILY HISTORY   History reviewed. No pertinent family history.    SOCIAL HISTORY    reports that she quit smoking about 21 years ago. She has never used smokeless tobacco. She reports that she does not drink alcohol or use illicit drugs.  Review of Systems  Constitutional: Positive for chills and malaise/fatigue. Negative for fever and weight loss.  HENT: Negative for ear pain, hearing loss and tinnitus.   Eyes: Negative for blurred vision, double vision, photophobia, pain, discharge and redness.  Respiratory: Positive for shortness of breath. Negative for cough, hemoptysis, sputum production and wheezing.   Cardiovascular: Positive for leg swelling. Negative for chest pain,  palpitations and orthopnea.  Gastrointestinal: Negative for heartburn, nausea, vomiting, abdominal pain and diarrhea.  Genitourinary: Positive for urgency.  Musculoskeletal: Positive for back pain. Negative for neck pain.  Skin: Negative for rash.  Neurological: Positive for dizziness and weakness. Negative for headaches.  Endo/Heme/Allergies: Does not bruise/bleed easily.      VITAL SIGNS    Temp:  [97.4 F (36.3 C)] 97.4 F (36.3 C) (04/07 1255) Pulse Rate:  [84-98] 98 (04/07 1507) Resp:  [18-22] 18 (04/07 1507) BP: (122-124)/(50-83) 124/83 mmHg (04/07 1507) SpO2:  [92 %-99 %] 99 % (04/07 1507) HEMODYNAMICS:   VENTILATOR SETTINGS:   INTAKE / OUTPUT: No intake or output data in the 24 hours ending 02/07/16 1553     PHYSICAL EXAM   Physical Exam  Constitutional: She appears well-developed and well-nourished.  Obese female in no significant respiratory distress, chills at times.   HENT:  Head: Normocephalic and atraumatic.  Right Ear: External ear normal.  Left Ear: External ear normal.  Nose: Nose normal.  Mouth/Throat: Oropharynx is clear and moist.  Eyes: Conjunctivae and EOM are normal. Pupils are equal, round, and reactive to light.  Neck: Normal range of motion.  Cardiovascular: Regular rhythm, normal heart sounds and intact distal pulses.   No murmur heard. Pulmonary/Chest: Effort normal and breath sounds normal.  Shallow BS Mild Tachypnea No wheezing, no use of accessory muscles.   Abdominal: Soft. Bowel sounds are normal.  Obese abdomen.   Musculoskeletal: She exhibits edema.  Neurological: She is alert.  Somnolent, easily arousable, mild confusion.   Skin: Skin is warm.  Psychiatric: She has a normal mood and affect.  Nursing note and vitals reviewed.      LABS   LABS:  CBC  Recent Labs Lab 02/07/16 1254  WBC 21.0*  HGB 10.9*  HCT 33.2*  PLT 210   Coag's No results for input(s): APTT, INR in the last 168 hours. BMET  Recent  Labs Lab 02/07/16 1254  NA 124*  K 5.1  CL 91*  CO2 24  BUN 38*  CREATININE 1.69*  GLUCOSE 151*   Electrolytes  Recent Labs Lab 02/07/16 1254  CALCIUM 8.9   Sepsis Markers No results for input(s): LATICACIDVEN, PROCALCITON, O2SATVEN in the last 168 hours. ABG No results for input(s): PHART, PCO2ART, PO2ART in the last 168 hours. Liver Enzymes No results for input(s): AST, ALT, ALKPHOS, BILITOT, ALBUMIN in the last 168 hours. Cardiac Enzymes  Recent Labs Lab 02/07/16 1254  TROPONINI <0.03   Glucose No results for input(s): GLUCAP in the last 168 hours.   No results found for this or any previous visit (from the past 240 hour(s)).   Current facility-administered medications:  .  piperacillin-tazobactam (ZOSYN) IVPB 3.375 g, 3.375 g, Intravenous, Once, Orbie Pyo, MD .  sodium chloride 0.9 % bolus 1,000 mL, 1,000 mL, Intravenous, Q1H **AND** sodium chloride 0.9 % bolus 1,000 mL, 1,000 mL, Intravenous, Q1H, Orbie Pyo, MD .  vancomycin (VANCOCIN) IVPB 1000 mg/200 mL premix, 1,000 mg, Intravenous, Once, Orbie Pyo, MD  Current outpatient prescriptions:  .  acetaminophen (TYLENOL) 500 MG tablet, Take 500 mg by mouth every 8 (eight) hours as needed for mild pain. , Disp: , Rfl:  .  albuterol (PROVENTIL HFA;VENTOLIN HFA) 108 (90 BASE) MCG/ACT inhaler, Inhale 2 puffs into the lungs every 4 (four) hours as needed for wheezing or shortness of breath., Disp: , Rfl:  .  allopurinol (ZYLOPRIM) 100 MG tablet, Take 100 mg by mouth daily. , Disp: , Rfl:  .  budesonide-formoterol (SYMBICORT) 160-4.5 MCG/ACT inhaler, Inhale 2 puffs into the lungs 2 (two) times daily., Disp: , Rfl:  .  calcium carbonate (TUMS) 500 MG chewable tablet, Chew 1 tablet by mouth every 4 (four) hours as needed for indigestion or heartburn., Disp: , Rfl:  .  clonazePAM (KLONOPIN) 0.5 MG tablet, Take 1 tablet (0.5 mg total) by mouth at bedtime., Disp: 60 tablet, Rfl: 0 .   cyanocobalamin 500 MCG tablet, Take 500 mcg by mouth 2 (two) times daily., Disp: , Rfl:  .  ertapenem (INVANZ) 1 g injection, Inject 1 g into the muscle daily., Disp: , Rfl:  .  ferrous sulfate 325 (65 FE) MG tablet, Take 325 mg by mouth daily with breakfast. , Disp: , Rfl:  .  fluticasone (FLONASE) 50 MCG/ACT nasal spray, Place 2 sprays into both nostrils daily., Disp: , Rfl: 2 .  furosemide (LASIX) 20 MG tablet, Take 2 tablets (40 mg total) by mouth daily., Disp: 30 tablet, Rfl:  .  gabapentin (NEURONTIN) 300 MG capsule, Take 300 mg by mouth 3 (three) times daily., Disp: , Rfl:  .  guaiFENesin-dextromethorphan (ROBITUSSIN DM) 100-10 MG/5ML syrup, Take 10 mLs by mouth every 4 (four) hours as needed for cough., Disp: , Rfl:  .  ipratropium-albuterol (DUONEB) 0.5-2.5 (3) MG/3ML SOLN, Take 3 mLs by nebulization every 6 (six) hours., Disp: 360 mL, Rfl:  .  levothyroxine (SYNTHROID, LEVOTHROID) 75 MCG tablet, Take 75 mcg by mouth daily before breakfast., Disp: , Rfl:  .  loratadine (CLARITIN) 10 MG tablet, Take 10 mg by mouth daily., Disp: , Rfl:  .  nystatin cream (MYCOSTATIN), Apply 1 application topically every 8 (eight) hours as needed (for yeast). Pt applies to all skin folds., Disp: , Rfl:  .  omeprazole (PRILOSEC) 20 MG capsule, Take 20 mg by mouth daily. , Disp: , Rfl:  .  PARoxetine (PAXIL) 30 MG tablet, Take 30 mg by mouth daily., Disp: , Rfl:  .  QUEtiapine (SEROQUEL) 25 MG tablet, Take 50 mg by mouth at bedtime., Disp: , Rfl:  .  Skin Protectants, Misc. (CALAZIME SKIN PROTECTANT EX), Apply 1 application topically 4 (four) times daily as needed (for rash). Pt applies to buttocks., Disp: , Rfl:  .  Skin Protectants, Misc. (EUCERIN) cream, Apply 1 application topically 2 (two) times daily., Disp: , Rfl:  .  tiotropium (SPIRIVA) 18 MCG inhalation capsule, Place 18 mcg into inhaler and inhale daily., Disp: , Rfl:  .  valsartan (DIOVAN) 160 MG tablet, Take 160 mg by mouth daily., Disp: , Rfl:  .   Vitamin D, Ergocalciferol, (DRISDOL) 50000 units CAPS capsule, Take 50,000 Units by mouth every 30 (thirty) days., Disp: , Rfl:  .  modafinil (PROVIGIL) 100 MG tablet, Take 1 tablet (100 mg total) by mouth daily. (Patient not taking: Reported on 02/07/2016), Disp: , Rfl:   IMAGING    Dg Chest 1 View  02/07/2016  CLINICAL DATA:  Weakness and shortness of breath EXAM: CHEST 1 VIEW COMPARISON:  January 13, 2016 FINDINGS: No pneumothorax. Low lung volumes limit evaluation. Cardiomegaly stable. The hila and mediastinum are unchanged. No pulmonary nodules or masses. Mild vascular congestion without overt edema. No focal infiltrate identified. IMPRESSION: Mild vascular congestion without overt edema. No other acute abnormalities. Electronically Signed   By: Dorise Bullion III M.D   On: 02/07/2016 13:59      Indwelling Urinary Catheter continued, requirement due to   Reason to continue Indwelling Urinary Catheter for strict Intake/Output monitoring for hemodynamic instability   Central Line continued, requirement due to   Reason to continue Central Line Monitoring of central venous pressure or other hemodynamic parameters   Ventilator continued, requirement due to, resp failure    Ventilator Sedation RASS 0 to -2   Cultures: BCx2 4/7>> UC  4/7>> Sputum  Antibiotics: ER abx - Vanc/Zosyn x 1 dose  Lines: PIVs  ASSESSMENT/PLAN  81 year old female past medical history of obesity, CHF, suspected sleep apnea, recent admission for UTI, seen in consultation for sepsis/respiratory distress.  PULMONARY Sepsis - ?UTI source Respiratory distress Suspected OSA/OHS Lethargy COPD  Chronic respiratory failure on 2L [place] continuously DNR/DNI P:   -Patient seen and evaluated, does not need admission by ICU team, can be admitted by hospitalist to stepdown unit. -Check blood, urine, cultures -Change Foley catheter -Follows sepsis protocol, lactic acid -Pressors as needed to maintain a map greater  >65 -Supplemental oxygen as needed, maintain O2 saturations greater than 88% - PRN duonebs - no significant signs of  AECOPD, therefore no need for steroids at this time.  -Continue antibiotics -Check pro calcitonin -Patient with no formal diagnosis of sleep apnea, however given her risk factors along with congestive heart failure, she may benefit from BiPAP in this acute setting. May use BiPAP when necessary -Spoke with 3 daughters at bedside, patient is a DO NOT RESUSCITATE/DO NOT INTUBATE - pt had a sleep study in 08/2011, unable to access records at this time.  - CXR reviewed - no infiltrate, mild vascular congestion with cardiomegaly. Will benefit from bipap PRN and at night.  - no significant hypoxia, does not need Bipap continuously  CARDIOVASCULAR CHF Hypotension Sepsis  P:  - plan as stated above - pressors as needed to maintain MAP>65 - gentle IVFs if needed - monitor BP  RENAL A:   ? UTI Hx of Recurrent UTI Chronic indwelling foley cath Bladder spasm P:   - check UA/Urine Cx - check PCT - cont with empiric abx - may consider meropenem, ID consult  - monitor UOP  GASTROINTESTINAL A:   P:   - SUP : PPI  HEMATOLOGIC A:  Leukocytosis P:  - possible due to sepsis - cont with abx and supportive care - plans as stated above - CBC as needed.   INFECTIOUS Sepsis- ?UTI Hx of recurrent UTI Chronic indwelling foley cath P:   - PCT> - check LA - abx as stated above - got 1 dose of vanc and zosyn in the ER  ENDOCRINE - monitor CBG, SSI if needed  NEUROLOGIC Lethargy Delirium Confusion Encephalopathy - metabolic P:   RASS goal: 0 - possible due to sepsis - limit use of benzos    SOCIAL - spoke with daughters at the bedside, updated them on the plan of care as stated above.  - daughters stated that patient is a DNR/DNI.    PCCM will continue to follow along.   I have personally obtained a history, examined the patient, evaluated laboratory and  imaging results, formulated the assessment and plan and placed orders.  Pulmonary Care Time devoted to patient care services described in this note is 45 minutes.   Vilinda Boehringer, MD [facility] Pager [phone] (please enter 7-digits) On Call Pager [phone] (please enter 7-digits)   02/07/2016, 3:53 PM  Note: This note was prepared with Dragon dictation along with smaller phrase technology. Any transcriptional errors that result from this process are unintentional.

## 2016-02-07 NOTE — H&P (Signed)
[facility] [place] NAME: Kathy Guzman    MR#:  [id number]  DATE OF BIRTH:  22-May-1935  DATE OF ADMISSION:  02/07/2016  PRIMARY CARE PHYSICIAN: Margarita Rana, MD   REQUESTING/REFERRING PHYSICIAN: Dr. Dineen Kid  CHIEF COMPLAINT:   Chief Complaint  Patient presents with  . Fatigue  . Shortness of Breath    HISTORY OF PRESENT ILLNESS:  Kathy Guzman  is a 81 y.o. female with a known history of Hypertension, CHF, morbid obesity, COPD, chronic respiratory failure on 3 at his oxygen presents from nursing home due to being drowsy for 3 days with poor appetite and saturations on the oxygen dropping into 50% while sleeping. Patient unable to contribute to history due to her drowsiness. History obtained from reviewing old records and discussing with daughter said bedside. Nursing home records reviewed. Discussed with ER physician. As per the daughter said bedside patient has been more weak with poor appetite and drowsiness for 3 days. Normally she is alert and awake but presently is confused. Bed bound. She has chronic Foley catheter due to urethral stricture. Has had recurrent urinary tract infections even prior to being placed on a Foley catheter. She was admitted one month back to [facility] with urinary tract infection. Cultures grew multiple organisms. No history of MRSA or ESBL.  PAST MEDICAL HISTORY:   Past Medical History  Diagnosis Date  . CHF (congestive heart failure) ([place])   . Hypertension   . Hyperlipidemia   . COPD (chronic obstructive pulmonary disease) ([place])   . GERD (gastroesophageal reflux disease)   . Hypothyroid   . Osteoarthritis   . Depression   . Anxiety   . Skin cancer   . Urethral stricture   . Gout   . Recurrent UTI     PAST SURGICAL HISTORY:   Past Surgical History  Procedure Laterality Date  . Partial hysterectomy    . Cholecystectomy      SOCIAL HISTORY:   Social History   Substance Use Topics  . Smoking status: Former Smoker    Quit date: 03/18/1994  . Smokeless tobacco: Never Used  . Alcohol Use: No    FAMILY HISTORY:   Family History  Problem Relation Age of Onset  . Colon cancer Brother   . Heart attack Father     DRUG ALLERGIES:   Allergies  Allergen Reactions  . Nsaids Other (See Comments)    Reaction:  Unknown   . Tramadol Itching    REVIEW OF SYSTEMS:   Review of Systems  Unable to perform ROS: mental status change    MEDICATIONS AT HOME:   Prior to Admission medications   Medication Sig Start Date End Date Taking? Authorizing Provider  acetaminophen (TYLENOL) 500 MG tablet Take 500 mg by mouth every 8 (eight) hours as needed for mild pain.    Yes Historical Provider, MD  albuterol (PROVENTIL HFA;VENTOLIN HFA) 108 (90 BASE) MCG/ACT inhaler Inhale 2 puffs into the lungs every 4 (four) hours as needed for wheezing or shortness of breath.   Yes Historical Provider, MD  allopurinol (ZYLOPRIM) 100 MG tablet Take 100 mg by mouth daily.    Yes Historical Provider, MD  budesonide-formoterol (SYMBICORT) 160-4.5 MCG/ACT inhaler Inhale 2 puffs into the lungs 2 (two) times daily.   Yes Historical Provider, MD  calcium carbonate (TUMS) 500 MG chewable tablet Chew 1 tablet by mouth every 4 (four) hours as needed for indigestion or heartburn.   Yes  Historical Provider, MD  clonazePAM (KLONOPIN) 0.5 MG tablet Take 1 tablet (0.5 mg total) by mouth at bedtime. 01/13/16  Yes Dustin Flock, MD  cyanocobalamin 500 MCG tablet Take 500 mcg by mouth 2 (two) times daily.   Yes Historical Provider, MD  ertapenem (INVANZ) 1 g injection Inject 1 g into the muscle daily. 01/31/16 02/10/16 Yes Historical Provider, MD  ferrous sulfate 325 (65 FE) MG tablet Take 325 mg by mouth daily with breakfast.    Yes Historical Provider, MD  fluticasone (FLONASE) 50 MCG/ACT nasal spray Place 2 sprays into both nostrils daily. 01/13/16  Yes Dustin Flock, MD  furosemide  (LASIX) 20 MG tablet Take 2 tablets (40 mg total) by mouth daily. 01/13/16  Yes Dustin Flock, MD  gabapentin (NEURONTIN) 300 MG capsule Take 300 mg by mouth 3 (three) times daily.   Yes Historical Provider, MD  guaiFENesin-dextromethorphan (ROBITUSSIN DM) 100-10 MG/5ML syrup Take 10 mLs by mouth every 4 (four) hours as needed for cough.   Yes Historical Provider, MD  ipratropium-albuterol (DUONEB) 0.5-2.5 (3) MG/3ML SOLN Take 3 mLs by nebulization every 6 (six) hours. 01/13/16  Yes Dustin Flock, MD  levothyroxine (SYNTHROID, LEVOTHROID) 75 MCG tablet Take 75 mcg by mouth daily before breakfast.   Yes Historical Provider, MD  loratadine (CLARITIN) 10 MG tablet Take 10 mg by mouth daily.   Yes Historical Provider, MD  nystatin cream (MYCOSTATIN) Apply 1 application topically every 8 (eight) hours as needed (for yeast). Pt applies to all skin folds.   Yes Historical Provider, MD  omeprazole (PRILOSEC) 20 MG capsule Take 20 mg by mouth daily.    Yes Historical Provider, MD  PARoxetine (PAXIL) 30 MG tablet Take 30 mg by mouth daily.   Yes Historical Provider, MD  QUEtiapine (SEROQUEL) 25 MG tablet Take 50 mg by mouth at bedtime.   Yes Historical Provider, MD  Skin Protectants, Misc. (CALAZIME SKIN PROTECTANT EX) Apply 1 application topically 4 (four) times daily as needed (for rash). Pt applies to buttocks.   Yes Historical Provider, MD  Skin Protectants, Misc. (EUCERIN) cream Apply 1 application topically 2 (two) times daily.   Yes Historical Provider, MD  tiotropium (SPIRIVA) 18 MCG inhalation capsule Place 18 mcg into inhaler and inhale daily.   Yes Historical Provider, MD  valsartan (DIOVAN) 160 MG tablet Take 160 mg by mouth daily.   Yes Historical Provider, MD  Vitamin D, Ergocalciferol, (DRISDOL) 50000 units CAPS capsule Take 50,000 Units by mouth every 30 (thirty) days.   Yes Historical Provider, MD  modafinil (PROVIGIL) 100 MG tablet Take 1 tablet (100 mg total) by mouth daily. Patient not  taking: Reported on 02/07/2016 01/13/16   Dustin Flock, MD     VITAL SIGNS:  Blood pressure 124/83, pulse 98, temperature 97.4 F (36.3 C), temperature source Oral, resp. rate 18, SpO2 99 %.  PHYSICAL EXAMINATION:  Physical Exam  GENERAL:  81 y.o.-year-old patient lying in the bed, Drowsy and morbidly obese EYES: Pupils equal, round, reactive to light and accommodation. No scleral icterus. Extraocular muscles intact.  HEENT: Head atraumatic, normocephalic. Oropharynx and nasopharynx clear. No oropharyngeal erythema, moist oral mucosa  NECK:  Supple, no jugular venous distention. No thyroid enlargement, no tenderness.  LUNGS: Poor air entry bilaterally. No wheezing or crackles heard. CARDIOVASCULAR: S1, S2 normal. No murmurs, rubs, or gallops.  ABDOMEN: Soft, nontender, nondistended. Bowel sounds present. No organomegaly or mass.  EXTREMITIES: No pedal edema, cyanosis, or clubbing. + 2 pedal & radial pulses b/l.  NEUROLOGIC: Moves all 4 extremities. Seems symmetrical. Follows some commands. PSYCHIATRIC: The patient is drowsy SKIN: Bruising and lower extremities. Small skin tear in the left popliteal fossa.  LABORATORY PANEL:   CBC  Recent Labs Lab 02/07/16 1254  WBC 21.0*  HGB 10.9*  HCT 33.2*  PLT 210   ------------------------------------------------------------------------------------------------------------------  Chemistries   Recent Labs Lab 02/07/16 1254  NA 124*  K 5.1  CL 91*  CO2 24  GLUCOSE 151*  BUN 38*  CREATININE 1.69*  CALCIUM 8.9   ------------------------------------------------------------------------------------------------------------------  Cardiac Enzymes  Recent Labs Lab 02/07/16 1254  TROPONINI <0.03   ------------------------------------------------------------------------------------------------------------------  RADIOLOGY:  Dg Chest 1 View  02/07/2016  CLINICAL DATA:  Weakness and shortness of breath EXAM: CHEST 1 VIEW  COMPARISON:  January 13, 2016 FINDINGS: No pneumothorax. Low lung volumes limit evaluation. Cardiomegaly stable. The hila and mediastinum are unchanged. No pulmonary nodules or masses. Mild vascular congestion without overt edema. No focal infiltrate identified. IMPRESSION: Mild vascular congestion without overt edema. No other acute abnormalities. Electronically Signed   By: Dorise Bullion III M.D   On: 02/07/2016 13:59     IMPRESSION AND PLAN:   * Severe sepsis likely from catheter related urinary tract infection and acute encephalopathy Patient has chronic Foley catheter. Check urinalysis Patient started on Zosyn Discontinue vancomycin. Wait for blood cultures and urine cultures. IV fluids. Lactic acid pending.  * Acute encephalopathy likely combination of infection and respiratory failure She has had similar presentation in the past.  * Acute renal failure with mild hyperkalemia Due to Sepsis Should improve with IV fluids  * Acute on chronic respiratory failure, hypercarbic Place on BiPAP. Repeat ABG later in the evening. Consult pulmonary.  * Chronic diastolic CHF No overt edema on the chest x-ray. She does seem to have chronic anasarca.  * Morbid obesity Likely has sleep apnea. Will need CPAP. Outpatient follow-up with pulmonary.  * Hyponatremia On normal saline  * DVT prophylaxis with Lovenox  All the records are reviewed and case discussed with ED provider. Management plans discussed with the patient, family and they are in agreement.  CODE STATUS: DNR/DNI  TOTAL CRITICAL CARE  TIME TAKING CARE OF THIS PATIENT: 40 minutes.   Hillary Bow R M.D on 02/07/2016 at 4:32 PM  Between 7am to 6pm - Pager - [phone]  After 6pm go to [email or web address] - password [email or web address] [facility]  Office  [phone]  CC: Primary care physician; Margarita Rana, MD  Note: This dictation was prepared with Dragon dictation along with smaller phrase technology. Any  transcriptional errors that result from this process are unintentional.

## 2016-02-07 NOTE — Progress Notes (Signed)
Pharmacy Antibiotic Note  Kathy Guzman is a 81 y.o. female admitted on 02/07/2016 with sepsis.  Pharmacy has been consulted for Zosyn dosing.  Plan: Zosyn 3.375g IV q8h (4 hour infusion).     Temp (24hrs), Avg:97.4 F (36.3 C), Min:97.4 F (36.3 C), Max:97.4 F (36.3 C)   Recent Labs Lab 02/07/16 1254 02/07/16 1610  WBC 21.0*  --   CREATININE 1.69*  --   LATICACIDVEN  --  2.3*    CrCl cannot be calculated (Unknown ideal weight.).    Allergies  Allergen Reactions  . Nsaids Other (See Comments)    Reaction:  Unknown   . Tramadol Itching    Antimicrobials this admission: zosyn 4/7 >>   Dose adjustments this admission:   Microbiology results:  Thank you for allowing pharmacy to be a part of this patient's care.  Paulina Fusi, PharmD, BCPS 02/07/2016 7:41 PM

## 2016-02-07 NOTE — ED Notes (Addendum)
Pt arrived via ems from [facility] after being called by family for repeat episodes of O2 drops while sleeping. Per ems family claims O2 drops to 60-70% while sleeping but returns to 98-99% when she awakens. Pt on 4L Nasal canula per usual. EMS vitals: BP 132 palpated, 98% O2 on 4L, and 24 Respirations

## 2016-02-07 NOTE — ED Provider Notes (Signed)
[facility] Emergency Department Provider Note  ____________________________________________  Time seen: Approximately 1:04 PM  I have reviewed the triage vital signs and the nursing notes.   HISTORY  Chief Complaint Fatigue and Shortness of Breath   HPI Kathy Guzman is a 81 y.o. female with a history of CHF and morbid obesity who is presenting to the emergency department today with shortness of breath. Per EMS, the shortness of breath is chronic and she is having dips in her oxygen saturation to 60 or 70% in the evenings but with resolution to 90-99% when awake on her ongoing 4 liters nasal cannula.Per EMS, the patient has been refusing evaluation for CPAP. The patient is denying any pain. Says that she vomited this morning but at this time is not having any nausea. Denies any pain.     Past Medical History  Diagnosis Date  . CHF (congestive heart failure) ([place])   . Hypertension   . Hyperlipidemia   . COPD (chronic obstructive pulmonary disease) ([place])   . GERD (gastroesophageal reflux disease)   . Hypothyroid   . Osteoarthritis   . Depression   . Anxiety   . Skin cancer   . Urethral stricture   . Gout   . Recurrent UTI     Patient Active Problem List   Diagnosis Date Noted  . Acute encephalopathy 01/10/2016  . Delirium 01/07/2016  . Acute cystitis without hematuria 01/07/2016  . Hyperkalemia 01/07/2016  . Hyponatremia 01/07/2016  . Anemia 01/07/2016  . Morbid obesity ([place]) 01/07/2016  . Body aches 01/07/2016  . Chronic diastolic heart failure ([place]) 03/19/2015  . COPD (chronic obstructive pulmonary disease) ([place]) 03/19/2015    Past Surgical History  Procedure Laterality Date  . Partial hysterectomy    . Cholecystectomy      Current Outpatient Rx  Name  Route  Sig  Dispense  Refill  . acetaminophen (TYLENOL) 500 MG tablet   Oral   Take 500 mg by mouth every 8 (eight) hours as needed for mild pain.          Marland Kitchen albuterol  (PROVENTIL HFA;VENTOLIN HFA) 108 (90 BASE) MCG/ACT inhaler   Inhalation   Inhale 2 puffs into the lungs every 4 (four) hours as needed for wheezing or shortness of breath.         . allopurinol (ZYLOPRIM) 100 MG tablet   Oral   Take 100 mg by mouth daily.          . budesonide-formoterol (SYMBICORT) 160-4.5 MCG/ACT inhaler   Inhalation   Inhale 2 puffs into the lungs 2 (two) times daily.         . calcium carbonate (TUMS) 500 MG chewable tablet   Oral   Chew 1 tablet by mouth every 4 (four) hours as needed for indigestion or heartburn.         . clonazePAM (KLONOPIN) 0.5 MG tablet   Oral   Take 1 tablet (0.5 mg total) by mouth at bedtime.   60 tablet   0   . cyanocobalamin 500 MCG tablet   Oral   Take 500 mcg by mouth 2 (two) times daily.         . ertapenem (INVANZ) 1 g injection   Intramuscular   Inject 1 g into the muscle daily.         . ferrous sulfate 325 (65 FE) MG tablet   Oral   Take 325 mg by mouth daily with breakfast.          .  fluticasone (FLONASE) 50 MCG/ACT nasal spray   Each Nare   Place 2 sprays into both nostrils daily.      2   . furosemide (LASIX) 20 MG tablet   Oral   Take 2 tablets (40 mg total) by mouth daily.   30 tablet      . gabapentin (NEURONTIN) 300 MG capsule   Oral   Take 300 mg by mouth 3 (three) times daily.         Marland Kitchen guaiFENesin-dextromethorphan (ROBITUSSIN DM) 100-10 MG/5ML syrup   Oral   Take 10 mLs by mouth every 4 (four) hours as needed for cough.         Marland Kitchen ipratropium-albuterol (DUONEB) 0.5-2.5 (3) MG/3ML SOLN   Nebulization   Take 3 mLs by nebulization every 6 (six) hours.   360 mL      . levothyroxine (SYNTHROID, LEVOTHROID) 75 MCG tablet   Oral   Take 75 mcg by mouth daily before breakfast.         . loratadine (CLARITIN) 10 MG tablet   Oral   Take 10 mg by mouth daily.         Marland Kitchen nystatin cream (MYCOSTATIN)   Topical   Apply 1 application topically every 8 (eight) hours as needed  (for yeast). Pt applies to all skin folds.         Marland Kitchen omeprazole (PRILOSEC) 20 MG capsule   Oral   Take 20 mg by mouth daily.          Marland Kitchen PARoxetine (PAXIL) 30 MG tablet   Oral   Take 30 mg by mouth daily.         . QUEtiapine (SEROQUEL) 25 MG tablet   Oral   Take 50 mg by mouth at bedtime.         . Skin Protectants, Misc. (CALAZIME SKIN PROTECTANT EX)   Apply externally   Apply 1 application topically 4 (four) times daily as needed (for rash). Pt applies to buttocks.         . Skin Protectants, Misc. (EUCERIN) cream   Topical   Apply 1 application topically 2 (two) times daily.         Marland Kitchen tiotropium (SPIRIVA) 18 MCG inhalation capsule   Inhalation   Place 18 mcg into inhaler and inhale daily.         . valsartan (DIOVAN) 160 MG tablet   Oral   Take 160 mg by mouth daily.         . Vitamin D, Ergocalciferol, (DRISDOL) 50000 units CAPS capsule   Oral   Take 50,000 Units by mouth every 30 (thirty) days.         . modafinil (PROVIGIL) 100 MG tablet   Oral   Take 1 tablet (100 mg total) by mouth daily. Patient not taking: Reported on 02/07/2016           Allergies Nsaids and Tramadol  Family History  Problem Relation Age of Onset  . Colon cancer Brother   . Heart attack Father     Social History Social History  Substance Use Topics  . Smoking status: Former Smoker    Quit date: 03/18/1994  . Smokeless tobacco: Never Used  . Alcohol Use: No    Review of Systems Constitutional: No fever/chills Eyes: No visual changes. ENT: No sore throat. Cardiovascular: Denies chest pain. Respiratory: Denies shortness of breath.  Gastrointestinal: No abdominal pain.   No diarrhea.  No constipation. Genitourinary: Negative for dysuria. Musculoskeletal:  Negative for back pain. Skin: Negative for rash. Neurological: Negative for headaches, focal weakness or numbness.  10-point ROS otherwise  negative.  ____________________________________________   PHYSICAL EXAM:  VITAL SIGNS: ED Triage Vitals  Enc Vitals Group     BP 02/07/16 1255 122/50 mmHg     Pulse Rate 02/07/16 1255 84     Resp 02/07/16 1255 22     Temp 02/07/16 1255 97.4 F (36.3 C)     Temp Source 02/07/16 1255 Oral     SpO2 02/07/16 1255 92 %     Weight --      Height --      Head Cir --      Peak Flow --      Pain Score 02/07/16 1256 7     Pain Loc --      Pain Edu? --      Excl. in [place]? --     Constitutional: Alert and orientedBut groggy and quickly falls asleep between questions. Morbidly obese Eyes: Conjunctivae are normal. PERRL. EOMI. Head: Atraumatic. Nose: No congestion/rhinnorhea. Mouth/Throat: Mucous membranes are moist.  Oropharynx non-erythematous. Neck: No stridor.   Cardiovascular: Normal rate, regular rhythm. Grossly normal heart sounds.  Good peripheral circulation. Respiratory: Normal respiratory effort.  No retractions. Scant wheezing throughout Gastrointestinal: Soft and nontender. No distention.  Musculoskeletal: No lower extremity tenderness nor edema.   Neurologic:  Normal speech and language. No gross focal neurologic deficits are appreciated. No gait instability. Skin:  Skin is warm, dry and intact. No rash noted.  ____________________________________________   LABS (all labs ordered are listed, but only abnormal results are displayed)  Labs Reviewed  CBC WITH DIFFERENTIAL/PLATELET - Abnormal; Notable for the following:    WBC 21.0 (*)    RBC 3.57 (*)    Hemoglobin 10.9 (*)    HCT 33.2 (*)    RDW 15.0 (*)    Neutro Abs 17.7 (*)    All other components within normal limits  BASIC METABOLIC PANEL - Abnormal; Notable for the following:    Sodium 124 (*)    Chloride 91 (*)    Glucose, Bld 151 (*)    BUN 38 (*)    Creatinine, Ser 1.69 (*)    GFR calc non Af Amer 28 (*)    GFR calc Af Amer 32 (*)    All other components within normal limits  BLOOD GAS, VENOUS -  Abnormal; Notable for the following:    pH, Ven 7.23 (*)    Acid-base deficit 3.5 (*)    All other components within normal limits  CULTURE, BLOOD (ROUTINE X 2)  CULTURE, BLOOD (ROUTINE X 2)  URINE CULTURE  TROPONIN I  BLOOD GAS, VENOUS  LACTIC ACID, PLASMA  LACTIC ACID, PLASMA  URINALYSIS COMPLETEWITH MICROSCOPIC ([facility] ONLY)  PROCALCITONIN   ____________________________________________  EKG  ED ECG REPORT I, Doran Stabler, the attending physician, personally viewed and interpreted this ECG.   Date: 02/07/2016  EKG Time: 1305  Rate: 103  Rhythm: sinus tachycardia  Axis: Normal axis  Intervals: Left anterior fascicular block.  ST&T Change: No ST segment elevation. T-wave inversions in aVL. No significant change from EKG done on 06/10/2015.  ____________________________________________  RADIOLOGY  Imaging Results       DG Chest 1 View (Final result) Result time: 02/07/16 13:59:17   Final result by Rad Results In Interface (02/07/16 13:59:17)   Narrative:   CLINICAL DATA: Weakness and shortness of breath  EXAM: CHEST 1 VIEW  COMPARISON:  January 13, 2016  FINDINGS: No pneumothorax. Low lung volumes limit evaluation. Cardiomegaly stable. The hila and mediastinum are unchanged. No pulmonary nodules or masses. Mild vascular congestion without overt edema. No focal infiltrate identified.  IMPRESSION: Mild vascular congestion without overt edema. No other acute abnormalities.   Electronically Signed By: Dorise Bullion III M.D On: 02/07/2016 13:59          ____________________________________________   PROCEDURES  CRITICAL CARE Performed by: Doran Stabler   Total critical care time: 35 minutes  Critical care time was exclusive of separately billable procedures and treating other patients.  Critical care was necessary to treat or prevent imminent or life-threatening deterioration.  Critical care was time spent personally by  me on the following activities: development of treatment plan with patient and/or surrogate as well as nursing, discussions with consultants, evaluation of patient's response to treatment, examination of patient, obtaining history from patient or surrogate, ordering and performing treatments and interventions, ordering and review of laboratory studies, ordering and review of radiographic studies, pulse oximetry and re-evaluation of patient's condition.   ____________________________________________   INITIAL IMPRESSION / ASSESSMENT AND PLAN / ED COURSE  Pertinent labs & imaging results that were available during my care of the patient were reviewed by me and considered in my medical decision making (see chart for details).  ----------------------------------------- 4:24 PM on 02/07/2016 -----------------------------------------  Initially thought to be related to CO2 narcosis. However, the carbon dioxide came back at 59 on the VBG. We did attempt an ABG but was unsuccessful secondary to the patient's body habitus. Despite CO2 being only 59 and the pH was 7.23. This is likely more to be from sepsis and possible lactic acidosis. The patient is a white count of 21. She was recently being treated for UTI. She has hazy urine in her Foley bag. Sepsis. All was initiated. Patient was also started on BiPAP. I did discuss the case Dr. Oletta Darter of the ICU, E ICU service. Dr. Leonidas Romberg also evaluated the patient at the bedside and thinks the patient is appropriate for stepdown. Patient was signed out the medicine, Dr. Earleen Newport.  Dr. Darvin Neighbours at the bedside and like to keep the patient on BiPAP temporarily. ____________________________________________   FINAL CLINICAL IMPRESSION(S) / ED DIAGNOSES  Sepsis. Altered mental status.    Orbie Pyo, MD 02/07/16 [phone]

## 2016-02-08 ENCOUNTER — Inpatient Hospital Stay (HOSPITAL_COMMUNITY)
Admit: 2016-02-08 | Discharge: 2016-02-08 | Disposition: A | Discharge status: Home / Self Care | Payer: Medicare Other | Attending: Nephrology | Admitting: Nephrology

## 2016-02-08 DIAGNOSIS — R7881 Bacteremia: Secondary | ICD-10-CM

## 2016-02-08 DIAGNOSIS — L8990 Pressure ulcer of unspecified site, unspecified stage: Secondary | ICD-10-CM | POA: Insufficient documentation

## 2016-02-08 DIAGNOSIS — I509 Heart failure, unspecified: Secondary | ICD-10-CM

## 2016-02-08 LAB — SODIUM
SODIUM: 128 mmol/L — AB (ref 135–145)
SODIUM: 127 mmol/L — AB (ref 135–145)

## 2016-02-08 LAB — CK: CK TOTAL: 18 U/L — AB (ref 38–234)

## 2016-02-08 LAB — URINALYSIS COMPLETE WITH MICROSCOPIC (ARMC ONLY)
Bilirubin Urine: NEGATIVE
GLUCOSE, UA: NEGATIVE mg/dL
Ketones, ur: NEGATIVE mg/dL
Nitrite: NEGATIVE
Protein, ur: NEGATIVE mg/dL
Specific Gravity, Urine: 1.011 (ref 1.005–1.030)
pH: 5 (ref 5.0–8.0)

## 2016-02-08 LAB — BLOOD CULTURE ID PANEL (REFLEXED)
Acinetobacter baumannii: NOT DETECTED
CANDIDA PARAPSILOSIS: NOT DETECTED
CANDIDA TROPICALIS: NOT DETECTED
CARBAPENEM RESISTANCE: NOT DETECTED
Candida albicans: NOT DETECTED
Candida glabrata: NOT DETECTED
Candida krusei: NOT DETECTED
ENTEROBACTERIACEAE SPECIES: NOT DETECTED
ENTEROCOCCUS SPECIES: DETECTED — AB
Enterobacter cloacae complex: NOT DETECTED
Escherichia coli: NOT DETECTED
Haemophilus influenzae: NOT DETECTED
KLEBSIELLA PNEUMONIAE: NOT DETECTED
Klebsiella oxytoca: NOT DETECTED
LISTERIA MONOCYTOGENES: NOT DETECTED
Methicillin resistance: NOT DETECTED
Neisseria meningitidis: NOT DETECTED
PROTEUS SPECIES: NOT DETECTED
Pseudomonas aeruginosa: NOT DETECTED
STAPHYLOCOCCUS AUREUS BCID: NOT DETECTED
STAPHYLOCOCCUS SPECIES: NOT DETECTED
Serratia marcescens: NOT DETECTED
Streptococcus agalactiae: NOT DETECTED
Streptococcus pneumoniae: NOT DETECTED
Streptococcus pyogenes: NOT DETECTED
Streptococcus species: NOT DETECTED
VANCOMYCIN RESISTANCE: DETECTED — AB

## 2016-02-08 LAB — BASIC METABOLIC PANEL
Anion gap: 9 (ref 5–15)
BUN: 38 mg/dL — AB (ref 6–20)
CHLORIDE: 91 mmol/L — AB (ref 101–111)
CO2: 21 mmol/L — AB (ref 22–32)
Calcium: 7.8 mg/dL — ABNORMAL LOW (ref 8.9–10.3)
Creatinine, Ser: 1.82 mg/dL — ABNORMAL HIGH (ref 0.44–1.00)
GFR calc Af Amer: 30 mL/min — ABNORMAL LOW (ref >60)
GFR calc non Af Amer: 25 mL/min — ABNORMAL LOW (ref >60)
GLUCOSE: 102 mg/dL — AB (ref 65–99)
POTASSIUM: 4.5 mmol/L (ref 3.5–5.1)
Sodium: 121 mmol/L — ABNORMAL LOW (ref 135–145)

## 2016-02-08 LAB — CBC
HEMATOCRIT: 27.7 % — AB (ref 35.0–47.0)
Hemoglobin: 9.2 g/dL — ABNORMAL LOW (ref 12.0–16.0)
MCH: 30.9 pg (ref 26.0–34.0)
MCHC: 33.3 g/dL (ref 32.0–36.0)
MCV: 92.7 fL (ref 80.0–100.0)
Platelets: 169 10*3/uL (ref 150–440)
RBC: 2.98 MIL/uL — ABNORMAL LOW (ref 3.80–5.20)
RDW: 14.9 % — AB (ref 11.5–14.5)
WBC: 20.9 10*3/uL — AB (ref 3.6–11.0)

## 2016-02-08 LAB — SODIUM, URINE, RANDOM: SODIUM UR: 24 mmol/L

## 2016-02-08 LAB — URIC ACID: URIC ACID, SERUM: 8.9 mg/dL — AB (ref 2.3–6.6)

## 2016-02-08 LAB — CORTISOL: Cortisol, Plasma: 15.3 ug/dL

## 2016-02-08 LAB — PROCALCITONIN: Procalcitonin: 0.6 ng/mL

## 2016-02-08 LAB — ECHOCARDIOGRAM COMPLETE
Height: 62 in
WEIGHTICAEL: 5643.78 [oz_av]

## 2016-02-08 LAB — OSMOLALITY: Osmolality: 282 mOsm/kg (ref 275–295)

## 2016-02-08 LAB — GLUCOSE, CAPILLARY: Glucose-Capillary: 130 mg/dL — ABNORMAL HIGH (ref 65–99)

## 2016-02-08 LAB — OSMOLALITY, URINE: Osmolality, Ur: 220 mOsm/kg — ABNORMAL LOW (ref 300–900)

## 2016-02-08 MED ORDER — CETYLPYRIDINIUM CHLORIDE 0.05 % MT LIQD
7.0000 mL | Freq: Two times a day (BID) | OROMUCOSAL | Status: DC
  Administered 2016-02-08 – 2016-02-11 (×5): 7 mL via OROMUCOSAL

## 2016-02-08 MED ORDER — SODIUM CHLORIDE 0.9 % IV BOLUS (SEPSIS)
250.0000 mL | Freq: Once | INTRAVENOUS | Status: AC
  Administered 2016-02-08: 250 mL via INTRAVENOUS

## 2016-02-08 MED ORDER — ENOXAPARIN SODIUM 40 MG/0.4ML ~~LOC~~ SOLN
40.0000 mg | Freq: Two times a day (BID) | SUBCUTANEOUS | Status: DC
  Administered 2016-02-08 – 2016-02-11 (×7): 40 mg via SUBCUTANEOUS
  Filled 2016-02-08 (×7): qty 0.4

## 2016-02-08 MED ORDER — SODIUM CHLORIDE 0.9 % IV SOLN
750.0000 mg | INTRAVENOUS | Status: DC
  Administered 2016-02-08 – 2016-02-09 (×2): 750 mg via INTRAVENOUS
  Filled 2016-02-08 (×3): qty 15

## 2016-02-08 NOTE — Progress Notes (Signed)
Anticoagulation monitoring(Lovenox):  80yo F ordered Lovenox 40 mg Q24h  Filed Weights   02/07/16 2200 02/08/16 0500  Weight: 352 lb 11.8 oz (160 kg) 352 lb 11.8 oz (160 kg)   BMI 64.7  Lab Results  Component Value Date   CREATININE 1.82* 02/08/2016   CREATININE 1.69* 02/07/2016   CREATININE 0.81 01/11/2016   Estimated Creatinine Clearance: 37.8 mL/min (by C-G formula based on Cr of 1.82). Hemoglobin & Hematocrit     Component Value Date/Time   HGB 9.2* 02/08/2016 0522   HGB 8.1* 09/04/2014 0504   HCT 27.7* 02/08/2016 0522   HCT 26.6* 09/04/2014 [id number]     Per Protocol for Patient with estCrcl> 30 ml/min and BMI > 40, will transition to Lovenox 40 mg Q12h.     Chinita Greenland PharmD Clinical Pharmacist 02/08/2016

## 2016-02-08 NOTE — Consult Note (Signed)
[facility] CONSULT NOTE    Date: 02/08/2016                  Patient Name:  Kathy Guzman  MRN: [id number]  DOB: November 27, 1934  Age / Sex: 81 y.o., female         PCP: Margarita Rana, MD                 Service Requesting Consult: Nicholes Mango, MD                 Reason for Consult: Acute renal failure, hyponatremia            History of Present Illness: Patient is a 81 y.o. female with a PMHx of Congestive heart failure, hypertension, hyperlipidemia, COPD, GERD, hypothyroidism, osteoarthritis, depression, anxiety, history of skin cancer, history of urethral stricture, gout, recurrent urinary tract infections, who was admitted to [facility] on 02/07/2016 for evaluation of lethargy, fatigue, and shortness of breath.   Patient resides at [facility]. She is very hard of hearing and history was offered by her daughters. Apparently over the past 3-4 days the patient has been rather lethargic at the nursing home. She's had limited by mouth intake during the daytime. The patient's daughter states that she was feeding her in the evenings. Patient has history of recurrent urinary tract infections due to a urethral stricture. She was admitted to [facility] for urinary tract infection approximately one month ago. We are asked to see her now for acute renal failure. Creatinine currently is 1.82. She also has concomitant hyponatremia with a serum sodium of 121. On 01/08/2016 serum sodium was normal at 135. Her baseline creatinine appears to be 0.81. Urinalysis demonstrates 6-30 WBCs per high-power field.   Medications: Outpatient medications: Prescriptions prior to admission  Medication Sig Dispense Refill Last Dose  . acetaminophen (TYLENOL) 500 MG tablet Take 500 mg by mouth every 8 (eight) hours as needed for mild pain.    PRN at PRN  . albuterol (PROVENTIL HFA;VENTOLIN HFA) 108 (90 BASE) MCG/ACT inhaler Inhale 2 puffs into the lungs every 4 (four)  hours as needed for wheezing or shortness of breath.   PRN at PRN  . allopurinol (ZYLOPRIM) 100 MG tablet Take 100 mg by mouth daily.    unknown at unknown   . budesonide-formoterol (SYMBICORT) 160-4.5 MCG/ACT inhaler Inhale 2 puffs into the lungs 2 (two) times daily.   unknown at unknown   . calcium carbonate (TUMS) 500 MG chewable tablet Chew 1 tablet by mouth every 4 (four) hours as needed for indigestion or heartburn.   PRN at PRN  . clonazePAM (KLONOPIN) 0.5 MG tablet Take 1 tablet (0.5 mg total) by mouth at bedtime. 60 tablet 0 unknown at unknown   . cyanocobalamin 500 MCG tablet Take 500 mcg by mouth 2 (two) times daily.   unknown at unknown   . ertapenem (INVANZ) 1 g injection Inject 1 g into the muscle daily.   unknown at unknown   . ferrous sulfate 325 (65 FE) MG tablet Take 325 mg by mouth daily with breakfast.    unknown at unknown   . fluticasone (FLONASE) 50 MCG/ACT nasal spray Place 2 sprays into both nostrils daily.  2 unknown at unknown   . furosemide (LASIX) 20 MG tablet Take 2 tablets (40 mg total) by mouth daily. 30 tablet  unknown at unknown   . gabapentin (NEURONTIN) 300 MG capsule Take 300 mg by mouth  3 (three) times daily.   unknown at unknown   . guaiFENesin-dextromethorphan (ROBITUSSIN DM) 100-10 MG/5ML syrup Take 10 mLs by mouth every 4 (four) hours as needed for cough.   PRN at PRN  . ipratropium-albuterol (DUONEB) 0.5-2.5 (3) MG/3ML SOLN Take 3 mLs by nebulization every 6 (six) hours. 360 mL  unknown at unknown   . levothyroxine (SYNTHROID, LEVOTHROID) 75 MCG tablet Take 75 mcg by mouth daily before breakfast.   unknown at unknown   . loratadine (CLARITIN) 10 MG tablet Take 10 mg by mouth daily.   unknown at unknown   . nystatin cream (MYCOSTATIN) Apply 1 application topically every 8 (eight) hours as needed (for yeast). Pt applies to all skin folds.   PRN at PRN  . omeprazole (PRILOSEC) 20 MG capsule Take 20 mg by mouth daily.    unknown at unknown   . PARoxetine  (PAXIL) 30 MG tablet Take 30 mg by mouth daily.   unknown at unknown   . QUEtiapine (SEROQUEL) 25 MG tablet Take 50 mg by mouth at bedtime.   unknown at unknown   . Skin Protectants, Misc. (CALAZIME SKIN PROTECTANT EX) Apply 1 application topically 4 (four) times daily as needed (for rash). Pt applies to buttocks.   PRN at PRN  . Skin Protectants, Misc. (EUCERIN) cream Apply 1 application topically 2 (two) times daily.   unknown at unknown   . tiotropium (SPIRIVA) 18 MCG inhalation capsule Place 18 mcg into inhaler and inhale daily.   unknown at unknown   . valsartan (DIOVAN) 160 MG tablet Take 160 mg by mouth daily.   unknown at unknown   . Vitamin D, Ergocalciferol, (DRISDOL) 50000 units CAPS capsule Take 50,000 Units by mouth every 30 (thirty) days.   unknown at unknown   . modafinil (PROVIGIL) 100 MG tablet Take 1 tablet (100 mg total) by mouth daily. (Patient not taking: Reported on 02/07/2016)       Current medications: Current Facility-Administered Medications  Medication Dose Route Frequency Provider Last Rate Last Dose  . 0.9 %  sodium chloride infusion   Intravenous Continuous Hillary Bow, MD 75 mL/hr at 02/08/16 0600    . antiseptic oral rinse (CPC / CETYLPYRIDINIUM CHLORIDE 0.05%) solution 7 mL  7 mL Mouth Rinse BID Hillary Bow, MD   7 mL at 02/08/16 0906  . bisacodyl (DULCOLAX) EC tablet 5 mg  5 mg Oral Daily PRN Srikar Sudini, MD      . docusate sodium (COLACE) capsule 100 mg  100 mg Oral BID Hillary Bow, MD   100 mg at 02/08/16 0039  . enoxaparin (LOVENOX) injection 40 mg  40 mg Subcutaneous Q24H Hillary Bow, MD   40 mg at 02/08/16 0043  . ipratropium-albuterol (DUONEB) 0.5-2.5 (3) MG/3ML nebulizer solution 3 mL  3 mL Nebulization Q4H PRN Srikar Sudini, MD      . morphine 2 MG/ML injection 2 mg  2 mg Intravenous Q4H PRN Srikar Sudini, MD      . ondansetron (ZOFRAN) tablet 4 mg  4 mg Oral Q6H PRN Hillary Bow, MD       Or  . ondansetron (ZOFRAN) injection 4 mg  4 mg  Intravenous Q6H PRN Srikar Sudini, MD      . piperacillin-tazobactam (ZOSYN) IVPB 3.375 g  3.375 g Intravenous 3 times per day Hillary Bow, MD   3.375 g at 02/08/16 0445  . polyethylene glycol (MIRALAX / GLYCOLAX) packet 17 g  17 g Oral Daily PRN Hillary Bow, MD      .  sodium chloride 0.9 % bolus 250 mL  250 mL Intravenous Once Vishal Mungal, MD   250 mL at 02/08/16 1051  . sodium chloride flush (NS) 0.9 % injection 3 mL  3 mL Intravenous Q12H Hillary Bow, MD   3 mL at 02/08/16 0906      Allergies: Allergies  Allergen Reactions  . Nsaids Other (See Comments)    Reaction:  Unknown   . Tramadol Itching      Past Medical History: Past Medical History  Diagnosis Date  . CHF (congestive heart failure) ([place])   . Hypertension   . Hyperlipidemia   . COPD (chronic obstructive pulmonary disease) ([place])   . GERD (gastroesophageal reflux disease)   . Hypothyroid   . Osteoarthritis   . Depression   . Anxiety   . Skin cancer   . Urethral stricture   . Gout   . Recurrent UTI      Past Surgical History: Past Surgical History  Procedure Laterality Date  . Partial hysterectomy    . Cholecystectomy       Family History: Family History  Problem Relation Age of Onset  . Colon cancer Brother   . Heart attack Father      Social History: Social History   Social History  . Marital Status: Divorced    Spouse Name: N/A  . Number of Children: N/A  . Years of Education: N/A   Occupational History  . Not on file.   Social History Main Topics  . Smoking status: Former Smoker    Quit date: 03/18/1994  . Smokeless tobacco: Never Used  . Alcohol Use: No  . Drug Use: No  . Sexual Activity: Not on file   Other Topics Concern  . Not on file   Social History Narrative     Review of Systems: Patient hard of hearing, therefore unable to adequately answer ROS questions  Vital Signs: Blood pressure 102/49, pulse 84, temperature 97.5 F (36.4 C), temperature source  Axillary, resp. rate 19, height 5\' 2"  (1.575 m), weight 160 kg (352 lb 11.8 oz), SpO2 100 %.  Weight trends: Filed Weights   02/07/16 2200 02/08/16 0500  Weight: 160 kg (352 lb 11.8 oz) 160 kg (352 lb 11.8 oz)    Physical Exam: General: NAD, sitting up in bed  Head: Normocephalic, atraumatic.  Eyes: Anicteric, EOMI  Nose: Mucous membranes moist, not inflammed, nonerythematous.  Throat: Oropharynx nonerythematous, no exudate appreciated.   Neck: Supple, trachea midline.  Lungs:  Normal respiratory effort. Mild bilateral rhonchi  Heart: S1S2 no rubs  Abdomen:  BS normoactive. Soft, Nondistended, non-tender.  No masses or organomegaly.  Extremities: No pretibial edema.  Neurologic: A&O to person, place.  Motor strength is 5/5 in the all 4 extremities  Skin: No visible rashes, scars.    Lab results: Basic Metabolic Panel:  Recent Labs Lab 02/07/16 1254 02/08/16 0522  NA 124* 121*  K 5.1 4.5  CL 91* 91*  CO2 24 21*  GLUCOSE 151* 102*  BUN 38* 38*  CREATININE 1.69* 1.82*  CALCIUM 8.9 7.8*    Liver Function Tests: No results for input(s): AST, ALT, ALKPHOS, BILITOT, PROT, ALBUMIN in the last 168 hours. No results for input(s): LIPASE, AMYLASE in the last 168 hours. No results for input(s): AMMONIA in the last 168 hours.  CBC:  Recent Labs Lab 02/07/16 1254 02/08/16 0522  WBC 21.0* 20.9*  NEUTROABS 17.7*  --   HGB 10.9* 9.2*  HCT 33.2* 27.7*  MCV 93.1  92.7  PLT 210 169    Cardiac Enzymes:  Recent Labs Lab 02/07/16 1254  TROPONINI <0.03    BNP: Invalid input(s): POCBNP  CBG:  Recent Labs Lab 02/08/16 0742  GLUCAP 130*    Microbiology: Results for orders placed or performed during the hospital encounter of 01/07/16  Culture, sputum-assessment     Status: None   Collection Time: 01/07/16  2:47 PM  Result Value Ref Range Status   Specimen Description SPUTUM  Final   Special Requests SPUTUM  Final   Sputum evaluation THIS SPECIMEN IS ACCEPTABLE  FOR SPUTUM CULTURE  Final   Report Status 01/10/2016 FINAL  Final  Culture, respiratory (NON-Expectorated)     Status: None   Collection Time: 01/07/16  2:47 PM  Result Value Ref Range Status   Specimen Description SPUTUM  Final   Special Requests SPUTUM Reflexed from [id number]  Final   Gram Stain   Final    GOOD SPECIMEN - 80-90% WBCS MODERATE WBC SEEN RARE YEAST RARE GRAM POSITIVE COCCI    Culture Consistent with normal respiratory flora.  Final   Report Status 01/11/2016 FINAL  Final  Urine culture     Status: None   Collection Time: 01/07/16  6:33 PM  Result Value Ref Range Status   Specimen Description URINE, RANDOM  Final   Special Requests NONE  Final   Culture   Final    MULTIPLE SPECIES PRESENT, SUGGEST RECOLLECTION MULTIPLE ORGANISMS PRESENT, NONE PREDOMINANT    Report Status 01/09/2016 FINAL  Final  MRSA PCR Screening     Status: None   Collection Time: 01/07/16 11:25 PM  Result Value Ref Range Status   MRSA by PCR NEGATIVE NEGATIVE Final    Comment:        The GeneXpert MRSA Assay (FDA approved for NASAL specimens only), is one component of a comprehensive MRSA colonization surveillance program. It is not intended to diagnose MRSA infection nor to guide or monitor treatment for MRSA infections.     Coagulation Studies: No results for input(s): LABPROT, INR in the last 72 hours.  Urinalysis:  Recent Labs  02/08/16 0927  COLORURINE YELLOW*  LABSPEC 1.011  PHURINE 5.0  GLUCOSEU NEGATIVE  HGBUR 1+*  BILIRUBINUR NEGATIVE  KETONESUR NEGATIVE  PROTEINUR NEGATIVE  NITRITE NEGATIVE  LEUKOCYTESUR 1+*      Imaging: Dg Chest 1 View  02/07/2016  CLINICAL DATA:  Weakness and shortness of breath EXAM: CHEST 1 VIEW COMPARISON:  January 13, 2016 FINDINGS: No pneumothorax. Low lung volumes limit evaluation. Cardiomegaly stable. The hila and mediastinum are unchanged. No pulmonary nodules or masses. Mild vascular congestion without overt edema. No focal infiltrate  identified. IMPRESSION: Mild vascular congestion without overt edema. No other acute abnormalities. Electronically Signed   By: Dorise Bullion III M.D   On: 02/07/2016 13:59      Assessment & Plan: Pt is a 81 y.o. female with a PMHx of Congestive heart failure, hypertension, hyperlipidemia, COPD, GERD, hypothyroidism, osteoarthritis, depression, anxiety, history of skin cancer, history of urethral stricture, gout, recurrent urinary tract infections, who was admitted to [facility] on 02/07/2016 for evaluation of lethargy, fatigue, and shortness of breath.    1.  Acute renal failure. 2.  Hyponatremia.  3.  Urinary tract infection.  Plan:  We are asked to see the patient for evaluation management of acute renal failure and hyponatremia. Difficult to determine her true volume status. To assist with this we will obtain 2-D echocardiogram as chest x-ray suggested vascular congestion.  However history would support intravascular volume depletion as she likely was not eating breakfast or lunch very well at the nursing home. For now continue 0.9 normal saline at 75 cc per hour. We will also add serum osmolality to the already ordered urine osmolality. We'll also check serum uric acid level. Continue to monitor serum sodium as well as renal function closely. No indication for dialysis at the moment. Treatment of urinary tract infection per hospitalist.

## 2016-02-08 NOTE — Progress Notes (Signed)
Pharmacy Antibiotic Note  Kathy Guzman is a 81 y.o. female admitted on 02/07/2016 with sepsis.  [employer] results were called to pharmacy from lab on 02/08/16 @ 1145. Results showed Enterococcus Van A/B positive (VRE) in 1 of 2 sets of Bcx. Dr. Margaretmary Eddy was contacted with microbiology results and recommendation. Pharmacy was consulted for daptomycin dosing.  4/8: CK  18  Plan: Patient was started on daptomycin 8mg /kg (Adj body weight of 94kg) for a a total dose of daptomycin 750mg  IV q24 hours. Recommend monitoring CK week.  Pharmacy will continue to follow per consult.      Height: 5\' 2"  (157.5 cm) Weight: (!) 352 lb 11.8 oz (160 kg) IBW/kg (Calculated) : 50.1  Temp (24hrs), Avg:98 F (36.7 C), Min:97.5 F (36.4 C), Max:98.2 F (36.8 C)   Recent Labs Lab 02/07/16 1254 02/07/16 1610 02/07/16 1917 02/08/16 0522  WBC 21.0*  --   --  20.9*  CREATININE 1.69*  --   --  1.82*  LATICACIDVEN  --  2.3* 1.9  --     Estimated Creatinine Clearance: 37.8 mL/min (by C-G formula based on Cr of 1.82).    Allergies  Allergen Reactions  . Nsaids Other (See Comments)    Reaction:  Unknown   . Tramadol Itching    Antimicrobials this admission: 02/07/16 Zosyn  >> 02/08/16 02/08/16 Daptomycin 750mg  >>   Microbiology results: 02/08/16 BCx: enterococcus Lucianne Lei A/B positive  02/08/15 UCx: pending    Thank you for allowing pharmacy to be a part of this patient's care.  Nancy Fetter, PharmD Pharmacy Resident  02/08/2016 2:10 PM

## 2016-02-08 NOTE — Progress Notes (Signed)
*  PRELIMINARY RESULTS* Echocardiogram 2D Echocardiogram has been performed.  Kathy Guzman 02/08/2016, 2:39 PM

## 2016-02-08 NOTE — Progress Notes (Signed)
LCSW met with patient and her entire family, 3 daughters, 3 grand kids and 1 son in Sports coach. LCSW was able to collect data to complete assessment and Fl2, patient has an existing   PASSR number [id number]. Patient will be returning to [facility] and transported by EMS when doctors feel patient ready for discharge. Stephanie Acre [phone] is her daughter and HCPOA. LCSW thanked each family member and offered extra support through out the weekend if required.  [employer] LCSW [phone]

## 2016-02-08 NOTE — Progress Notes (Addendum)
[place] at [place] NAME: Kathy Guzman    MR#:  [id number]  DATE OF BIRTH:  03-02-1935  SUBJECTIVE:  CHIEF COMPLAINT:  Patient is awake and alert and off BiPAP. Currently on oxygen via nasal cannula. 2 daughters at bedside. Patient has chronic indwelling Foley catheter which was changed in the ED yesterday at the time of admission. 2 daughters are concerned that patient is getting recurrent UTIs. Intermittent episodes of pleasant confusion noticed  REVIEW OF SYSTEMS:  CONSTITUTIONAL: No fever, fatigue or weakness. Obese EYES: No blurred or double vision.  EARS, NOSE, AND THROAT: No tinnitus or ear pain.  RESPIRATORY: No cough, shortness of breath, wheezing or hemoptysis.  CARDIOVASCULAR: No chest pain, orthopnea, edema.  GASTROINTESTINAL: No nausea, vomiting, diarrhea or abdominal pain.  GENITOURINARY: No dysuria, hematuria. Chronic indwelling Foley catheter ENDOCRINE: No polyuria, nocturia,  HEMATOLOGY: No anemia, easy bruising or bleeding SKIN: No rash or lesion. MUSCULOSKELETAL: No joint pain or arthritis.   NEUROLOGIC: No tingling, numbness, weakness.  PSYCHIATRY: No anxiety or depression.   DRUG ALLERGIES:   Allergies  Allergen Reactions  . Nsaids Other (See Comments)    Reaction:  Unknown   . Tramadol Itching    VITALS:  Blood pressure 84/51, pulse 80, temperature 97.5 F (36.4 C), temperature source Axillary, resp. rate 16, height 5\' 2"  (1.575 m), weight 160 kg (352 lb 11.8 oz), SpO2 99 %.  PHYSICAL EXAMINATION:  GENERAL:  81 y.o.-year-old patient lying in the bed with no acute distress. Obese EYES: Pupils equal, round, reactive to light and accommodation. No scleral icterus. Extraocular muscles intact.  HEENT: Head atraumatic, normocephalic. Oropharynx and nasopharynx clear.  NECK:  Supple, no jugular venous distention. No thyroid enlargement, no tenderness.  LUNGS: Moderate breath sounds bilaterally, no wheezing,  rales,rhonchi or crepitation. No use of accessory muscles of respiration.  CARDIOVASCULAR: S1, S2 normal. No murmurs, rubs, or gallops.  ABDOMEN: Soft, nontender, obese. Bowel sounds present. No organomegaly or mass. Chronic indwelling Foley catheter EXTREMITIES: No pedal edema, cyanosis, or clubbing.  NEUROLOGIC: Cranial nerves II through XII are grossly intact.intermittent episodes of pleasant confusion noticed  PSYCHIATRIC: The patient is alert and oriented x 2-3.  SKIN: No obvious rash, lesion, or ulcer.    LABORATORY PANEL:   CBC  Recent Labs Lab 02/08/16 0522  WBC 20.9*  HGB 9.2*  HCT 27.7*  PLT 169   ------------------------------------------------------------------------------------------------------------------  Chemistries   Recent Labs Lab 02/08/16 0522  NA 121*  K 4.5  CL 91*  CO2 21*  GLUCOSE 102*  BUN 38*  CREATININE 1.82*  CALCIUM 7.8*   ------------------------------------------------------------------------------------------------------------------  Cardiac Enzymes  Recent Labs Lab 02/07/16 1254  TROPONINI <0.03   ------------------------------------------------------------------------------------------------------------------  RADIOLOGY:  Dg Chest 1 View  02/07/2016  CLINICAL DATA:  Weakness and shortness of breath EXAM: CHEST 1 VIEW COMPARISON:  January 13, 2016 FINDINGS: No pneumothorax. Low lung volumes limit evaluation. Cardiomegaly stable. The hila and mediastinum are unchanged. No pulmonary nodules or masses. Mild vascular congestion without overt edema. No focal infiltrate identified. IMPRESSION: Mild vascular congestion without overt edema. No other acute abnormalities. Electronically Signed   By: Dorise Bullion III M.D   On: 02/07/2016 13:59    EKG:   Orders placed or performed during the hospital encounter of 02/07/16  . ED EKG  . ED EKG  . EKG 12-Lead  . EKG 12-Lead    ASSESSMENT AND PLAN:    * Severe sepsis likely from  catheter related  urinary tract infection and acute encephalopathy Patient has chronic Foley catheter, that has been changed in the ED yesterday at the time of admission. Urinalysis with 1+ leukoesterase, blood culture 1 bottle is with VRE sensitive to daptomycin, we will start the patient on daptomycin Discontinue vancomycin and Zosyn Wait for final blood cultures and urine cultures. IV fluids. Lactic acid is trending down at 1.9 today  * Acute encephalopathy likely combination of infection and respiratory failure Clinically improving according to the daughters   * Acute renal failure with hyponatremia sodium at 121 Will closely monitor intake and output 2 50 cc normal saline bolus in 2 hours followed by continuation of the IV fluids. Closely watch the patient for symptoms and signs of fluid overload as patient has history of congestive heart failure. Monitor serial sodiums, will check urine and serum osmolality, TSH and lipid panel Nephrology consult is placed  * Acute on chronic respiratory failure, hypercarbic Off BiPAP. Clinically improving. Appreciate pulmonology recommendations Will continue CPAP daily at bedtime for possible underlying obstructive sleep apnea  * Chronic diastolic CHF No overt edema on the chest x-ray. She does seem to have chronic anasarca.  * Morbid obesity Likely has sleep apnea. Will need CPAP daily at bedtime. Outpatient follow-up with pulmonary.   * DVT prophylaxis with Lovenox    All the records are reviewed and case discussed with Care Management/Social Workerr. Management plans discussed with the patient, 2 daughters at bedside  and they are in agreement.  CODE STATUS: DO NOT RESUSCITATE  TOTAL critical care TIME TAKING CARE OF THIS PATIENT: 40 minutes.   POSSIBLE D/C IN 2-3 DAYS, DEPENDING ON CLINICAL CONDITION.   Nicholes Mango M.D on 02/08/2016 at 11:20 AM  Between 7am to 6pm - Pager - [phone] After 6pm go to [email or web address] - password  [facility]  [facility]  Office  [phone]  CC: Primary care physician; Margarita Rana, MD

## 2016-02-08 NOTE — Clinical Social Work Note (Signed)
Clinical Social Work Assessment  Patient Details  Name: Kathy Guzman MRN: [id number] Date of Birth: 06-03-1935  Date of referral:  02/08/16               Reason for consult:  Discharge Planning                Permission sought to share information with:  Family Supports, Facility Sport and exercise psychologist Permission granted to share information::  Yes, Verbal Permission Granted  Name::     HCPOA Clarene Critchley  [phone]  Agency::  [facility]  Relationship::  yes  Contact Information:  yes  Housing/Transportation Living arrangements for the past 2 months:  [place] of Information:  Patient, Adult Children Patient Interpreter Needed:  None Criminal Activity/Legal Involvement Pertinent to Current Situation/Hospitalization:  No - Comment as needed Significant Relationships:  Adult Children Lives with:  Facility Resident Do you feel safe going back to the place where you live?  Yes Need for family participation in patient care:  Yes (Comment)  Care giving concerns:  Patient is to return to [facility] but family was provided a new booklet on SNF   Social Worker assessment / plan: LCSW met with patient and entire family and collected data from [facility] to complete Fl2 and assessment. Patient was polite and in good spirits. Patient is 352 pounds and its the families plan for her to return while they start looking for other SNF. Patient is not ambulatory and requires full assist with ADL and has Chonic 02 ( 2-4 litres). Patient  Has indwelling catheter, CHF,COPD, hypertension,hyperthyroidism,and possibly some kidney issues.   Employment status:  Retired Forensic scientist:  Information systems manager, Medicaid In [employer] ([employer] care) PT Recommendations:  Not assessed at this time Information / Referral to community resources:  [place]  Patient/Family's Response to care: They feel the care at [facility] is very good  Patient/Family's Understanding of and  Emotional Response to Diagnosis, Current Treatment, and Prognosis: They understand that their family member has had a lot of medical issues and hopes that she will do better in the near future.  Emotional Assessment Appearance:  Appears stated age Attitude/Demeanor/Rapport:  Other (Cooperative and pleasant) Affect (typically observed):  Accepting Orientation:  Oriented to Self, Oriented to Place, Oriented to  Time, Oriented to Situation, Fluctuating Orientation (Suspected and/or reported Sundowners) Alcohol / Substance use:  Never Used Psych involvement (Current and /or in the community):  No (Comment)  Discharge Needs  Concerns to be addressed:  No discharge needs identified Readmission within the last 30 days:  No Current discharge risk:  None Barriers to Discharge:  No Barriers Identified   Joana Reamer, LCSW 02/08/2016, 2:44 PM

## 2016-02-08 NOTE — NC FL2 (Signed)
[place] LEVEL OF CARE SCREENING TOOL     IDENTIFICATION  Patient Name: Kathy Guzman Birthdate: 07-31-1935 Sex: female Admission Date (Current Location): 02/07/2016  [place] and [place] Number:  Engineering geologist and Address:  [facility], [address], [place], [place] [address]      Provider Number: [id number]  Attending Physician Name and Address:  Nicholes Mango, MD  Relative Name and Phone Number:       Current Level of Care: SNF Recommended Level of Care: [place] Prior Approval Number:    Date Approved/Denied:   PASRR Number:  [id number]  Discharge Plan: SNF    Current Diagnoses: Patient Active Problem List   Diagnosis Date Noted  . Pressure ulcer 02/08/2016  . Sepsis ([place]) 02/07/2016  . Acute encephalopathy 01/10/2016  . Delirium 01/07/2016  . Acute cystitis without hematuria 01/07/2016  . Hyperkalemia 01/07/2016  . Hyponatremia 01/07/2016  . Anemia 01/07/2016  . Morbid obesity ([place]) 01/07/2016  . Body aches 01/07/2016  . Chronic diastolic heart failure ([place]) 03/19/2015  . COPD (chronic obstructive pulmonary disease) ([place]) 03/19/2015    Orientation RESPIRATION BLADDER Height & Weight     Self, Time, Situation, Place  O2 Indwelling catheter Weight: (!) 352 lb 11.8 oz (160 kg) Height:  5\' 2"  (0000000 cm)  BEHAVIORAL SYMPTOMS/MOOD NEUROLOGICAL BOWEL NUTRITION STATUS      Continent Diet (Diabetic)  AMBULATORY STATUS COMMUNICATION OF NEEDS Skin   Extensive Assist Verbally Normal                       Personal Care Assistance Level of Assistance  Bathing, Feeding, Dressing, Total care Bathing Assistance: Maximum assistance Feeding assistance: Maximum assistance Dressing Assistance: Maximum assistance Total Care Assistance: Maximum assistance   Functional Limitations Info  Sight, Hearing, Speech Sight Info: Adequate Hearing Info: Impaired Speech Info: Adequate    SPECIAL CARE FACTORS  FREQUENCY  PT (By licensed PT)     PT Frequency: 5x week              Contractures Contractures Info: Not present    Additional Factors Info  Code Status, Allergies Code Status Info: DNR Allergies Info: Nsaids, tramadol           Current Medications (02/08/2016):  This is the current hospital active medication list Current Facility-Administered Medications  Medication Dose Route Frequency Provider Last Rate Last Dose  . 0.9 %  sodium chloride infusion   Intravenous Continuous Hillary Bow, MD 75 mL/hr at 02/08/16 0600    . antiseptic oral rinse (CPC / CETYLPYRIDINIUM CHLORIDE 0.05%) solution 7 mL  7 mL Mouth Rinse BID Hillary Bow, MD   7 mL at 02/08/16 0906  . bisacodyl (DULCOLAX) EC tablet 5 mg  5 mg Oral Daily PRN Srikar Sudini, MD      . DAPTOmycin (CUBICIN) 750 mg in sodium chloride 0.9 % IVPB  750 mg Intravenous Q24H Nikolas Casher, MD      . docusate sodium (COLACE) capsule 100 mg  100 mg Oral BID Hillary Bow, MD   100 mg at 02/08/16 0039  . enoxaparin (LOVENOX) injection 40 mg  40 mg Subcutaneous Q12H Srikar Sudini, MD      . ipratropium-albuterol (DUONEB) 0.5-2.5 (3) MG/3ML nebulizer solution 3 mL  3 mL Nebulization Q4H PRN Srikar Sudini, MD      . morphine 2 MG/ML injection 2 mg  2 mg Intravenous Q4H PRN Hillary Bow, MD      .  ondansetron (ZOFRAN) tablet 4 mg  4 mg Oral Q6H PRN Hillary Bow, MD       Or  . ondansetron (ZOFRAN) injection 4 mg  4 mg Intravenous Q6H PRN Srikar Sudini, MD      . polyethylene glycol (MIRALAX / GLYCOLAX) packet 17 g  17 g Oral Daily PRN Srikar Sudini, MD      . sodium chloride flush (NS) 0.9 % injection 3 mL  3 mL Intravenous Q12H Hillary Bow, MD   3 mL at 02/08/16 0906     Discharge Medications: Please see discharge summary for a list of discharge medications.  Relevant Imaging Results:  Relevant Lab Results:   Additional Information [id number]  Joana Reamer, [place]

## 2016-02-08 NOTE — Progress Notes (Signed)
PULMONARY / CRITICAL CARE MEDICINE   Name: Kathy Guzman MRN: [id number] DOB: Jun 08, 1935    ADMISSION DATE:  02/07/2016  BRIEF HISTORY: 81 year old female past medical history of CHF, morbid obesity, hypertension, COPD, GERD, osteoarthritis, chronic indwelling Foley catheter secondary to urethral strictures, recurrent UTI, recent admission for UTI, presented on 47 with sepsis-like picture which shortness of breath and lethargy. Placed on BiPAP, significantly improved, most likely having another UTI.  SUBJECTIVE:  More awake and alert this morning, there was at bedside, patient very interactive  SIGNIFICANT EVENTS:  4/7 - lethargy/sepsis, ?UTI, foley catheter changed in the ER  VITAL SIGNS: Temp:  [97.4 F (36.3 C)-98.2 F (36.8 C)] 97.5 F (36.4 C) (04/08 0800) Pulse Rate:  [79-98] 85 (04/08 1200) Resp:  [12-22] 15 (04/08 1200) BP: (64-137)/(35-103) 98/39 mmHg (04/08 1200) SpO2:  [92 %-100 %] 100 % (04/08 1200) FiO2 (%):  [28 %] 28 % (04/08 0000) Weight:  [352 lb 11.8 oz (160 kg)] 352 lb 11.8 oz (160 kg) (04/08 0500) HEMODYNAMICS:   VENTILATOR SETTINGS: Vent Mode:  [-]  FiO2 (%):  [28 %] 28 % INTAKE / OUTPUT:  Intake/Output Summary (Last 24 hours) at 02/08/16 1246 Last data filed at 02/08/16 0800  Gross per 24 hour  Intake 913.75 ml  Output    130 ml  Net 783.75 ml    Review of Systems  Constitutional: Positive for chills and malaise/fatigue. Negative for fever and weight loss.  Eyes: Negative for blurred vision.  Respiratory: Positive for shortness of breath. Negative for cough, hemoptysis and wheezing.   Gastrointestinal: Negative for heartburn.  Genitourinary: Positive for frequency.  Musculoskeletal: Negative for myalgias.  Skin: Negative for itching and rash.  Neurological: Positive for weakness. Negative for headaches.  Endo/Heme/Allergies: Does not bruise/bleed easily.    Physical Exam  Constitutional: She is well-developed, well-nourished, and in no  distress.  HENT:  Head: Normocephalic and atraumatic.  Right Ear: External ear normal.  Left Ear: External ear normal.  Nose: Nose normal.  Mouth/Throat: Oropharynx is clear and moist.  Eyes: Conjunctivae and EOM are normal. Pupils are equal, round, and reactive to light.  Neck: Normal range of motion. Neck supple.  Cardiovascular: Normal rate, regular rhythm, normal heart sounds and intact distal pulses.   Pulmonary/Chest: Effort normal. She has no wheezes. She has no rales.  Shallow breath sounds at the bases, no significant wheezing  Abdominal: Soft. Bowel sounds are normal.  Musculoskeletal: Normal range of motion. She exhibits edema.  Neurological: She is alert.  Skin: Skin is warm and dry.  Nursing note and vitals reviewed.    LABS:  CBC  Recent Labs Lab 02/07/16 1254 02/08/16 0522  WBC 21.0* 20.9*  HGB 10.9* 9.2*  HCT 33.2* 27.7*  PLT 210 169   Coag's No results for input(s): APTT, INR in the last 168 hours. BMET  Recent Labs Lab 02/07/16 1254 02/08/16 0522  NA 124* 121*  K 5.1 4.5  CL 91* 91*  CO2 24 21*  BUN 38* 38*  CREATININE 1.69* 1.82*  GLUCOSE 151* 102*   Electrolytes  Recent Labs Lab 02/07/16 1254 02/08/16 0522  CALCIUM 8.9 7.8*   Sepsis Markers  Recent Labs Lab 02/07/16 1254 02/07/16 1610 02/07/16 1917 02/08/16 0522  LATICACIDVEN  --  2.3* 1.9  --   PROCALCITON 0.20  --   --  0.60   ABG No results for input(s): PHART, PCO2ART, PO2ART in the last 168 hours. Liver Enzymes No results for input(s): AST, ALT, ALKPHOS,  BILITOT, ALBUMIN in the last 168 hours. Cardiac Enzymes  Recent Labs Lab 02/07/16 1254  TROPONINI <0.03   Glucose  Recent Labs Lab 02/08/16 0742  GLUCAP 130*    Imaging Dg Chest 1 View  02/07/2016  CLINICAL DATA:  Weakness and shortness of breath EXAM: CHEST 1 VIEW COMPARISON:  January 13, 2016 FINDINGS: No pneumothorax. Low lung volumes limit evaluation. Cardiomegaly stable. The hila and mediastinum are  unchanged. No pulmonary nodules or masses. Mild vascular congestion without overt edema. No focal infiltrate identified. IMPRESSION: Mild vascular congestion without overt edema. No other acute abnormalities. Electronically Signed   By: Dorise Bullion III M.D   On: 02/07/2016 13:59    Cultures: BCx2 4/7>>GPC UC 4/7>> Sputum  Antibiotics: ER abx - Vanc/Zosyn x 1 dose Daptomycin 4/7>  Lines: PIVs  ASSESSMENT / PLAN: 81 year old female past medical history of obesity, CHF, suspected sleep apnea, recent admission for UTI, seen in consultation for sepsis/respiratory distress.  PULMONARY Sepsis - ?UTI source Respiratory distress Suspected OSA/OHS Lethargy COPD  Chronic respiratory failure on 2L [place] continuously DNR/DNI P:  -Patient seen and evaluated, does not need admission by ICU team, can be admitted by hospitalist to stepdown unit. -Check blood, urine, cultures -Change Foley catheter -Follows sepsis protocol, lactic acid -Pressors as needed to maintain a map greater >65 -Supplemental oxygen as needed, maintain O2 saturations greater than 88% - PRN duonebs - no significant signs of AECOPD, therefore no need for steroids at this time.  -Continue antibiotics -Check pro calcitonin -Patient with no formal diagnosis of sleep apnea, however given her risk factors along with congestive heart failure, she may benefit from BiPAP in this acute setting. May use BiPAP when necessary -Spoke with 3 daughters at bedside, patient is a DO NOT RESUSCITATE/DO NOT INTUBATE - pt had a sleep study in 08/2011, unable to access records at this time.  - CXR reviewed - no infiltrate, mild vascular congestion with cardiomegaly. Will benefit from bipap PRN and at night.  - no significant hypoxia, does not need Bipap continuously  CARDIOVASCULAR CHF Hypotension Sepsis  P:  - plan as stated above - pressors as needed to maintain MAP>65 - gentle IVFs if needed - monitor BP  RENAL A:   ? UTI Hx of Recurrent UTI Chronic indwelling foley cath Bladder spasm Acute renal failure - ?dehydratin Hyponatremia P:  - check UA/Urine Cx - check PCT - foley catheter changed in the ER - cont with empiric abx - may consider meropenem, ID consult  - monitor UOP - nephrology following   GASTROINTESTINAL A:  P:  - SUP : PPI  HEMATOLOGIC A: Leukocytosis P:  - possible due to sepsis - cont with abx and supportive care - plans as stated above - CBC as needed.   INFECTIOUS Sepsis- ?UTI Bacteremia - GPC Hx of recurrent UTI Chronic indwelling foley cath P:  - PCT>0.6, cont with abx - check LA - abx as stated above - got 1 dose of vanc and zosyn in the ER - check ECHO  ENDOCRINE - monitor CBG, SSI if needed  NEUROLOGIC Lethargy Delirium Confusion Encephalopathy - metabolic P:  RASS goal: 0 - possible due to sepsis - limit use of benzos    SOCIAL - spoke with daughters at the bedside, updated them on the plan of care as stated above.  - daughters stated that patient is a DNR/DNI.    PCCM will continue to follow along.   I have personally obtained a history, examined the patient,  evaluated laboratory and imaging results, formulated the assessment and plan and placed orders.  Pulmonary Care Time devoted to patient care services described in this note is 45 minutes.   Vilinda Boehringer, MD [facility] Pager [phone] (please enter 7-digits) On Call Pager - [phone] (please enter 7-digits)  Note: This note was prepared with Dragon dictation along with smaller phrase technology. Any transcriptional errors that result from this process are unintentional.

## 2016-02-08 NOTE — Progress Notes (Signed)
Ms. Trias is alert and oriented. Off bipap since 0900. Currently on 2L nasal cannula. Two large bowel movements today. 3 person assist when turning in bed. Tolerating meals ok. Sodium of 121. Nephrology consulted. Family at bedside most of the day. VRe in urine. Started on contact percautions per Dr. Stevenson Clinch. Family and patient educated. VSS.

## 2016-02-08 NOTE — Progress Notes (Signed)
Placed patient on 2LPM [place] per patient's home O2 use.  Patient tolerating 2L well at this time

## 2016-02-09 ENCOUNTER — Inpatient Hospital Stay: Payer: Medicare Other

## 2016-02-09 DIAGNOSIS — A408 Other streptococcal sepsis: Secondary | ICD-10-CM

## 2016-02-09 DIAGNOSIS — J9621 Acute and chronic respiratory failure with hypoxia: Secondary | ICD-10-CM

## 2016-02-09 LAB — BASIC METABOLIC PANEL
Anion gap: 10 (ref 5–15)
BUN: 36 mg/dL — AB (ref 6–20)
CHLORIDE: 100 mmol/L — AB (ref 101–111)
CO2: 19 mmol/L — ABNORMAL LOW (ref 22–32)
Calcium: 7.9 mg/dL — ABNORMAL LOW (ref 8.9–10.3)
Creatinine, Ser: 1.32 mg/dL — ABNORMAL HIGH (ref 0.44–1.00)
GFR calc Af Amer: 44 mL/min — ABNORMAL LOW (ref >60)
GFR calc non Af Amer: 38 mL/min — ABNORMAL LOW (ref >60)
Glucose, Bld: 126 mg/dL — ABNORMAL HIGH (ref 65–99)
POTASSIUM: 4.3 mmol/L (ref 3.5–5.1)
SODIUM: 129 mmol/L — AB (ref 135–145)

## 2016-02-09 LAB — CBC
HEMATOCRIT: 28.5 % — AB (ref 35.0–47.0)
Hemoglobin: 9.2 g/dL — ABNORMAL LOW (ref 12.0–16.0)
MCH: 30.1 pg (ref 26.0–34.0)
MCHC: 32.4 g/dL (ref 32.0–36.0)
MCV: 92.7 fL (ref 80.0–100.0)
Platelets: 191 10*3/uL (ref 150–440)
RBC: 3.07 MIL/uL — ABNORMAL LOW (ref 3.80–5.20)
RDW: 14.7 % — ABNORMAL HIGH (ref 11.5–14.5)
WBC: 14.8 10*3/uL — AB (ref 3.6–11.0)

## 2016-02-09 LAB — SODIUM
SODIUM: 132 mmol/L — AB (ref 135–145)
Sodium: 128 mmol/L — ABNORMAL LOW (ref 135–145)
Sodium: 132 mmol/L — ABNORMAL LOW (ref 135–145)
Sodium: 134 mmol/L — ABNORMAL LOW (ref 135–145)

## 2016-02-09 LAB — LIPID PANEL
CHOL/HDL RATIO: 3.2 ratio
CHOLESTEROL: 211 mg/dL — AB (ref 0–200)
HDL: 65 mg/dL (ref >40)
LDL Cholesterol: 112 mg/dL — ABNORMAL HIGH (ref 0–99)
TRIGLYCERIDES: 169 mg/dL — AB (ref <150)
VLDL: 34 mg/dL (ref 0–40)

## 2016-02-09 LAB — TSH: TSH: 2.526 u[IU]/mL (ref 0.350–4.500)

## 2016-02-09 NOTE — Progress Notes (Signed)
[facility]  ROUNDING NOTE   Subjective:  Serum sodium up to 129 this a.m. Creatinine down to 1.32. Being bathed this a.m.    Objective:  Vital signs in last 24 hours:  Temp:  [97.8 F (36.6 C)-98.8 F (37.1 C)] 98.6 F (37 C) (04/09 0400) Pulse Rate:  [84-106] 92 (04/09 0800) Resp:  [13-23] 16 (04/09 0800) BP: (73-137)/(33-91) 115/53 mmHg (04/09 0800) SpO2:  [96 %-100 %] 100 % (04/09 0800) FiO2 (%):  [28 %] 28 % (04/09 0757) Weight:  [160.5 kg (353 lb 13.4 oz)] 160.5 kg (353 lb 13.4 oz) (04/09 0612)  Weight change: 0.5 kg (1 lb 1.6 oz) Filed Weights   02/07/16 2200 02/08/16 0500 02/09/16 0612  Weight: 160 kg (352 lb 11.8 oz) 160 kg (352 lb 11.8 oz) 160.5 kg (353 lb 13.4 oz)    Intake/Output: I/O last 3 completed shifts: In: 1933.8 [P.O.:120; I.V.:1733.8; Other:80] Out: 39 [Urine:980]   Intake/Output this shift:     Physical Exam: General: NAD, morbidly obese  Head: Normocephalic, atraumatic. Moist oral mucosal membranes  Eyes: Anicteric  Neck: Supple, trachea midline  Lungs:  Clear to auscultation, normal effort  Heart: S1S2 no rubs  Abdomen:  Soft, nontender, BS present   Extremities: trace peripheral edema.  Neurologic: Nonfocal, moving all four extremities  Skin: No lesions       Basic Metabolic Panel:  Recent Labs Lab 02/07/16 1254 02/08/16 0522 02/08/16 1408 02/08/16 1743 02/08/16 2355 02/09/16 0613  NA 124* 121* 128* 127* 128* 129*  K 5.1 4.5  --   --   --  4.3  CL 91* 91*  --   --   --  100*  CO2 24 21*  --   --   --  19*  GLUCOSE 151* 102*  --   --   --  126*  BUN 38* 38*  --   --   --  36*  CREATININE 1.69* 1.82*  --   --   --  1.32*  CALCIUM 8.9 7.8*  --   --   --  7.9*    Liver Function Tests: No results for input(s): AST, ALT, ALKPHOS, BILITOT, PROT, ALBUMIN in the last 168 hours. No results for input(s): LIPASE, AMYLASE in the last 168 hours. No results for input(s): AMMONIA in the last 168 hours.  CBC:  Recent  Labs Lab 02/07/16 1254 02/08/16 0522 02/09/16 0613  WBC 21.0* 20.9* 14.8*  NEUTROABS 17.7*  --   --   HGB 10.9* 9.2* 9.2*  HCT 33.2* 27.7* 28.5*  MCV 93.1 92.7 92.7  PLT 210 169 191    Cardiac Enzymes:  Recent Labs Lab 02/07/16 1254 02/08/16 1408  CKTOTAL  --  18*  TROPONINI <0.03  --     BNP: Invalid input(s): POCBNP  CBG:  Recent Labs Lab 02/08/16 0742  GLUCAP 130*    Microbiology: Results for orders placed or performed during the hospital encounter of 02/07/16  Blood Culture (routine x 2)     Status: Abnormal (Preliminary result)   Collection Time: 02/07/16  4:10 PM  Result Value Ref Range Status   Specimen Description BLOOD RIGHT CHEST  Final   Special Requests BOTTLES DRAWN AEROBIC AND ANAEROBIC  2CC  Final   Culture  Setup Time   Final    GRAM POSITIVE COCCI IN CHAINS ANAEROBIC BOTTLE ONLY CRITICAL RESULT CALLED TO, READ BACK BY AND VERIFIED WITH: SHEEMA HALLAJI 02/08/2016 1148 BY J RAZZAKSUAREZ,MT    Culture (A)  Final    VANCOMYCIN RESISTANT ENTEROCOCCUS ANAEROBIC BOTTLE ONLY ADDITIONAL SUSCEPTIBILITIES TO FOLLOW    Report Status PENDING  Incomplete  Blood Culture (routine x 2)     Status: None (Preliminary result)   Collection Time: 02/07/16  4:10 PM  Result Value Ref Range Status   Specimen Description BLOOD RIGHT SHOULDER  Final   Special Requests BOTTLES DRAWN AEROBIC AND ANAEROBIC  1CC  Final   Culture NO GROWTH < 24 HOURS  Final   Report Status PENDING  Incomplete  Blood Culture ID Panel (Reflexed)     Status: Abnormal   Collection Time: 02/07/16  4:10 PM  Result Value Ref Range Status   Enterococcus species DETECTED (A) NOT DETECTED Final    Comment: CRITICAL RESULT CALLED TO, READ BACK BY AND VERIFIED WITH: SHEEMA HALLAJI 02/08/2016 1148 BY J RAZZAKSUAREZ,MT    Vancomycin resistance DETECTED (A) NOT DETECTED Final    Comment: CRITICAL RESULT CALLED TO, READ BACK BY AND VERIFIED WITH: SHEEMA HALLAJI 02/08/2016 1148 BY J RAZZAKSUAREZ,MT     Listeria monocytogenes NOT DETECTED NOT DETECTED Final   Staphylococcus species NOT DETECTED NOT DETECTED Final   Staphylococcus aureus NOT DETECTED NOT DETECTED Final   Methicillin resistance NOT DETECTED NOT DETECTED Final   Streptococcus species NOT DETECTED NOT DETECTED Final   Streptococcus agalactiae NOT DETECTED NOT DETECTED Final   Streptococcus pneumoniae NOT DETECTED NOT DETECTED Final   Streptococcus pyogenes NOT DETECTED NOT DETECTED Final   Acinetobacter baumannii NOT DETECTED NOT DETECTED Final   Enterobacteriaceae species NOT DETECTED NOT DETECTED Final   Enterobacter cloacae complex NOT DETECTED NOT DETECTED Final   Escherichia coli NOT DETECTED NOT DETECTED Final   Klebsiella oxytoca NOT DETECTED NOT DETECTED Final   Klebsiella pneumoniae NOT DETECTED NOT DETECTED Final   Proteus species NOT DETECTED NOT DETECTED Final   Serratia marcescens NOT DETECTED NOT DETECTED Final   Carbapenem resistance NOT DETECTED NOT DETECTED Final   Haemophilus influenzae NOT DETECTED NOT DETECTED Final   Neisseria meningitidis NOT DETECTED NOT DETECTED Final   Pseudomonas aeruginosa NOT DETECTED NOT DETECTED Final   Candida albicans NOT DETECTED NOT DETECTED Final   Candida glabrata NOT DETECTED NOT DETECTED Final   Candida krusei NOT DETECTED NOT DETECTED Final   Candida parapsilosis NOT DETECTED NOT DETECTED Final   Candida tropicalis NOT DETECTED NOT DETECTED Final  Urine culture     Status: None (Preliminary result)   Collection Time: 02/08/16  9:27 AM  Result Value Ref Range Status   Specimen Description URINE, RANDOM  Final   Special Requests NONE  Final   Culture NO GROWTH 1 DAY  Final   Report Status PENDING  Incomplete    Coagulation Studies: No results for input(s): LABPROT, INR in the last 72 hours.  Urinalysis:  Recent Labs  02/08/16 0927  COLORURINE YELLOW*  LABSPEC 1.011  PHURINE 5.0  GLUCOSEU NEGATIVE  HGBUR 1+*  BILIRUBINUR NEGATIVE  KETONESUR NEGATIVE   PROTEINUR NEGATIVE  NITRITE NEGATIVE  LEUKOCYTESUR 1+*      Imaging: Dg Chest 1 View  02/07/2016  CLINICAL DATA:  Weakness and shortness of breath EXAM: CHEST 1 VIEW COMPARISON:  January 13, 2016 FINDINGS: No pneumothorax. Low lung volumes limit evaluation. Cardiomegaly stable. The hila and mediastinum are unchanged. No pulmonary nodules or masses. Mild vascular congestion without overt edema. No focal infiltrate identified. IMPRESSION: Mild vascular congestion without overt edema. No other acute abnormalities. Electronically Signed   By: Dorise Bullion III M.D   On:  02/07/2016 13:59   Dg Chest Port 1 View  02/09/2016  CLINICAL DATA:  Followup bibasilar atelectasis. Patient admitted 02/07/2016 for sepsis. EXAM: PORTABLE CHEST 1 VIEW COMPARISON:  02/07/2016 and earlier. FINDINGS: Markedly suboptimal inspiration. Airspace consolidation with air bronchograms in the medial left lower lobe. Lungs otherwise clear. Pulmonary vascularity normal. No visible pleural effusions. Cardiac silhouette moderately enlarged, unchanged. IMPRESSION: Markedly suboptimal inspiration. Developing medial left lower lobe atelectasis and/or pneumonia. Electronically Signed   By: Evangeline Dakin M.D.   On: 02/09/2016 08:11     Medications:   . sodium chloride 75 mL/hr at 02/09/16 0800   . antiseptic oral rinse  7 mL Mouth Rinse BID  . DAPTOmycin (CUBICIN)  IV  750 mg Intravenous Q24H  . docusate sodium  100 mg Oral BID  . enoxaparin (LOVENOX) injection  40 mg Subcutaneous Q12H  . sodium chloride flush  3 mL Intravenous Q12H   bisacodyl, ipratropium-albuterol, morphine injection, ondansetron **OR** ondansetron (ZOFRAN) IV, polyethylene glycol  Assessment/ Plan:  81 y.o. female with a PMHx of Congestive heart failure, hypertension, hyperlipidemia, COPD, GERD, hypothyroidism, osteoarthritis, depression, anxiety, history of skin cancer, history of urethral stricture, gout, recurrent urinary tract infections, who was  admitted to [facility] on 02/07/2016 for evaluation of lethargy, fatigue, and shortness of breath.   1. Acute renal failure. 2. Hyponatremia.  3. Urinary tract infection. 4.  VRE sepsis.  Plan:  Renal function appears to be improving at the moment. Creatinine down to 1.3.  Serum sodium also has improved up to 129. Continue 0.9 normal saline at 75 cc per hour. She was also found to have VRE sepsis and has been started on daptomycin under the approval of infectious disease. Continue to monitor serum electrolytes and renal function. Avoid nephrotoxins as possible.   LOS: 2 Kathy Guzman 4/9/201710:51 AM

## 2016-02-09 NOTE — Progress Notes (Signed)
Pharmacy Antibiotic Note  Kathy Guzman is a 81 y.o. female admitted on 02/07/2016 with sepsis.  [employer] results were called to pharmacy from lab on 02/08/16 @ 1145. Results showed Enterococcus Van A/B positive (VRE) in 1 of 2 sets of Bcx. Dr. Margaretmary Eddy was contacted with microbiology results and recommendation. Pharmacy was consulted for daptomycin dosing. (approved by Dr. Francesco Runner ID MD )  4/8: CK  18  Plan: Patient was started on daptomycin 8mg /kg (Adj body weight of 94kg) for a a total dose of daptomycin 750mg  IV q24 hours. Recommend monitoring CK week.  Pharmacy will continue to follow per consult.      Height: 5\' 2"  (157.5 cm) Weight: (!) 353 lb 13.4 oz (160.5 kg) IBW/kg (Calculated) : 50.1  Temp (24hrs), Avg:98.4 F (36.9 C), Min:97.8 F (36.6 C), Max:98.8 F (37.1 C)   Recent Labs Lab 02/07/16 1254 02/07/16 1610 02/07/16 1917 02/08/16 0522 02/09/16 0613  WBC 21.0*  --   --  20.9* 14.8*  CREATININE 1.69*  --   --  1.82* 1.32*  LATICACIDVEN  --  2.3* 1.9  --   --     Estimated Creatinine Clearance: 52.3 mL/min (by C-G formula based on Cr of 1.32).    Allergies  Allergen Reactions  . Nsaids Other (See Comments)    Reaction:  Unknown   . Tramadol Itching    Antimicrobials this admission: 02/07/16 Zosyn  >> 02/08/16 02/08/16 Daptomycin 750mg  >>   Microbiology results: 02/08/16 BCx: enterococcus Van A/B positive - VRE 02/08/15 UCx: pending    Thank you for allowing pharmacy to be a part of this patient's care.  Chinita Greenland PharmD Clinical Pharmacist 02/09/2016 1:09 PM

## 2016-02-09 NOTE — Progress Notes (Signed)
Patient now off bipap. Used for appx 4 hours. States face is hurting and she can't go to sleep at all. Patient in no distress. Placed her back on nasal cannula. Tolerating well

## 2016-02-09 NOTE — Progress Notes (Signed)
Patient has refused bipap for the night. 

## 2016-02-09 NOTE — Progress Notes (Signed)
PULMONARY / CRITICAL CARE MEDICINE   Name: Kathy Guzman MRN: [id number] DOB: July 06, 1935    ADMISSION DATE:  02/07/2016  BRIEF HISTORY: 81 year old female past medical history of CHF, morbid obesity, hypertension, COPD, GERD, osteoarthritis, chronic indwelling Foley catheter secondary to urethral strictures, recurrent UTI, recent admission for UTI, presented on 47 with sepsis-like picture which shortness of breath and lethargy. Placed on BiPAP, significantly improved, most likely having another UTI.  SUBJECTIVE:  More awake and alert this morning, patient very interactive, no fevers overnight, wore bipap for 4 hours lastnight.   SIGNIFICANT EVENTS:  4/7 - lethargy/sepsis, ?UTI, foley catheter changed in the ER 4/9 - bipap QHS only, stable to transfer out the ICU  VITAL SIGNS: Temp:  [97.8 F (36.6 C)-98.8 F (37.1 C)] 98.6 F (37 C) (04/09 0400) Pulse Rate:  [80-106] 95 (04/09 0600) Resp:  [13-23] 22 (04/09 0600) BP: (73-137)/(33-91) 86/60 mmHg (04/09 0600) SpO2:  [96 %-100 %] 100 % (04/09 0600) FiO2 (%):  [28 %] 28 % (04/09 0757) Weight:  [353 lb 13.4 oz (160.5 kg)] 353 lb 13.4 oz (160.5 kg) (04/09 0612) HEMODYNAMICS:   VENTILATOR SETTINGS: Vent Mode:  [-]  FiO2 (%):  [28 %] 28 % INTAKE / OUTPUT:  Intake/Output Summary (Last 24 hours) at 02/09/16 0849 Last data filed at 02/09/16 0600  Gross per 24 hour  Intake   1020 ml  Output    850 ml  Net    170 ml    Review of Systems  Constitutional: Positive for malaise/fatigue. Negative for fever and weight loss.  Eyes: Negative for blurred vision.  Respiratory: Positive for shortness of breath. Negative for cough, hemoptysis and wheezing.   Gastrointestinal: Negative for heartburn.  Genitourinary: Positive for frequency.  Musculoskeletal: Negative for myalgias.  Skin: Negative for itching and rash.  Neurological: Positive for weakness. Negative for headaches.  Endo/Heme/Allergies: Does not bruise/bleed easily.     Physical Exam  Constitutional: She is well-developed, well-nourished, and in no distress.  HENT:  Head: Normocephalic and atraumatic.  Right Ear: External ear normal.  Left Ear: External ear normal.  Nose: Nose normal.  Mouth/Throat: Oropharynx is clear and moist.  Eyes: Conjunctivae and EOM are normal. Pupils are equal, round, and reactive to light.  Neck: Normal range of motion. Neck supple.  Cardiovascular: Normal rate, regular rhythm, normal heart sounds and intact distal pulses.   Pulmonary/Chest: Effort normal. She has no wheezes. She has no rales.  Shallow breath sounds at the bases, no significant wheezing  Abdominal: Soft. Bowel sounds are normal.  Musculoskeletal: Normal range of motion. She exhibits edema.  Neurological: She is alert.  Skin: Skin is warm and dry.  Nursing note and vitals reviewed.    LABS:  CBC  Recent Labs Lab 02/07/16 1254 02/08/16 0522 02/09/16 0613  WBC 21.0* 20.9* 14.8*  HGB 10.9* 9.2* 9.2*  HCT 33.2* 27.7* 28.5*  PLT 210 169 191   Coag's No results for input(s): APTT, INR in the last 168 hours. BMET  Recent Labs Lab 02/07/16 1254 02/08/16 0522 02/08/16 1408 02/08/16 1743 02/08/16 2355  NA 124* 121* 128* 127* 128*  K 5.1 4.5  --   --   --   CL 91* 91*  --   --   --   CO2 24 21*  --   --   --   BUN 38* 38*  --   --   --   CREATININE 1.69* 1.82*  --   --   --  GLUCOSE 151* 102*  --   --   --    Electrolytes  Recent Labs Lab 02/07/16 1254 02/08/16 0522  CALCIUM 8.9 7.8*   Sepsis Markers  Recent Labs Lab 02/07/16 1254 02/07/16 1610 02/07/16 1917 02/08/16 0522  LATICACIDVEN  --  2.3* 1.9  --   PROCALCITON 0.20  --   --  0.60   ABG No results for input(s): PHART, PCO2ART, PO2ART in the last 168 hours. Liver Enzymes No results for input(s): AST, ALT, ALKPHOS, BILITOT, ALBUMIN in the last 168 hours. Cardiac Enzymes  Recent Labs Lab 02/07/16 1254  TROPONINI <0.03   Glucose  Recent Labs Lab  02/08/16 0742  GLUCAP 130*    Imaging Dg Chest Port 1 View  02/09/2016  CLINICAL DATA:  Followup bibasilar atelectasis. Patient admitted 02/07/2016 for sepsis. EXAM: PORTABLE CHEST 1 VIEW COMPARISON:  02/07/2016 and earlier. FINDINGS: Markedly suboptimal inspiration. Airspace consolidation with air bronchograms in the medial left lower lobe. Lungs otherwise clear. Pulmonary vascularity normal. No visible pleural effusions. Cardiac silhouette moderately enlarged, unchanged. IMPRESSION: Markedly suboptimal inspiration. Developing medial left lower lobe atelectasis and/or pneumonia. Electronically Signed   By: Evangeline Dakin M.D.   On: 02/09/2016 08:11    Cultures: BCx2 4/7>>GPC>>VRE BCx2 4/9>> UC 4/7>> Sputum  Antibiotics: ER abx - Vanc/Zosyn x 1 dose Daptomycin 4/7>  Lines: PIVs  ASSESSMENT / PLAN: 81 year old female past medical history of obesity, CHF, suspected sleep apnea, recent admission for UTI, seen in consultation for sepsis/respiratory distress.  PULMONARY Sepsis - ?UTI source Respiratory distress Suspected OSA/OHS Lethargy - improving COPD  Chronic respiratory failure on 2L [place] continuously DNR/DNI P:  -Patient seen and evaluated, does not need admission by ICU team, can be admitted by hospitalist to stepdown unit. -Check blood, urine, cultures -Change Foley catheter -Follows sepsis protocol, lactic acid -Pressors as needed to maintain a map greater >65 -Supplemental oxygen as needed, maintain O2 saturations greater than 88% - PRN duonebs - no significant signs of AECOPD, therefore no need for steroids at this time.  -Continue antibiotics -Check pro calcitonin -Patient with no formal diagnosis of sleep apnea, however given her risk factors along with congestive heart failure, she may benefit from BiPAP in this acute setting. May use BiPAP when necessary -Spoke with 3 daughters at bedside, patient is a DO NOT RESUSCITATE/DO NOT INTUBATE - pt had a sleep  study in 08/2011, unable to access records at this time, per daughters she did not test positive for OSA at that time.  - CXR reviewed - no infiltrate, mild vascular congestion with cardiomegaly. Will benefit from bipap PRN and at night, and ICS - no significant hypoxia, does not need Bipap continuously - stable to transfer out the ICU  CARDIOVASCULAR CHF Hypotension-resolved Sepsis  P:  - plan as stated above - pressors as needed to maintain MAP>65 - gentle IVFs if needed, eating well now - monitor BP  RENAL A:  ? UTI Hx of Recurrent UTI Chronic indwelling foley cath Bladder spasm Acute renal failure - ?dehydratin Hyponatremia P:  - check UA/Urine Cx - check PCT - foley catheter changed in the ER - cont with empiric abx - may consider meropenem, ID consult  - monitor UOP - nephrology following   GASTROINTESTINAL A:  P:  - SUP : PPI  HEMATOLOGIC A: Leukocytosis P:  - possible due to sepsis - cont with abx and supportive care - plans as stated above - CBC as needed.   INFECTIOUS Sepsis- ?UTI Bacteremia - GPC>VRE  Hx of recurrent UTI Chronic indwelling foley cath P:  - cont with abx - 1st set of bld cx with VRE, now on dapto, consider repeating bld cx today - check LA - abx as stated above - got 1 dose of vanc and zosyn in the ER - ECHO, EF 60-65%, nl LV size, unable to obtain good views for more accurate reading.   ENDOCRINE - monitor CBG, SSI if needed  NEUROLOGIC Lethargy - resolving Delirium - resolving Confusion -resolving Encephalopathy - metabolic- resolving P:  RASS goal: 0 - possible due to sepsis, now improving - limit use of benzos    SOCIAL - daughters stated that patient is a DNR/DNI.    Patient stable to transfer out of the ICU today, hospitalist already following.    Thank you for consulting [facility], we will signoff at this time.  Please feel free to contact us with any questions  at [phone] (please enter 7-digits).   I have personally obtained a history, examined the patient, evaluated laboratory and imaging results, formulated the assessment and plan and placed orders.  Pulmonary Care Time devoted to patient care services described in this note is 30 minutes.   Vilinda Boehringer, MD [facility] Pager [phone] (please enter 7-digits) On Call Pager - [phone] (please enter 7-digits)  Note: This note was prepared with Dragon dictation along with smaller phrase technology. Any transcriptional errors that result from this process are unintentional.

## 2016-02-09 NOTE — Progress Notes (Signed)
[place] at [place] NAME: Kathy Guzman    MR#:  [id number]  DATE OF BIRTH:  [birth year]  SUBJECTIVE:  CHIEF COMPLAINT:  Patient is more  awake and alert and using BiPAP daily at bedtime. Currently on oxygen via nasal cannula. Denies any shortness of breath or chest pain. Feeling better  REVIEW OF SYSTEMS:  CONSTITUTIONAL: No fever, fatigue or weakness. Obese EYES: No blurred or double vision.  EARS, NOSE, AND THROAT: No tinnitus or ear pain.  RESPIRATORY: No cough, shortness of breath, wheezing or hemoptysis.  CARDIOVASCULAR: No chest pain, orthopnea, edema.  GASTROINTESTINAL: No nausea, vomiting, diarrhea or abdominal pain.  GENITOURINARY: No dysuria, hematuria. Chronic indwelling Foley catheter ENDOCRINE: No polyuria, nocturia,  HEMATOLOGY: No anemia, easy bruising or bleeding SKIN: No rash or lesion. MUSCULOSKELETAL: No joint pain or arthritis.   NEUROLOGIC: No tingling, numbness, weakness.  PSYCHIATRY: No anxiety or depression.   DRUG ALLERGIES:   Allergies  Allergen Reactions  . Nsaids Other (See Comments)    Reaction:  Unknown   . Tramadol Itching    VITALS:  Blood pressure 98/76, pulse 96, temperature 98.6 F (37 C), temperature source Oral, resp. rate 18, height 5\' 2"  (1.575 m), weight 160.5 kg (353 lb 13.4 oz), SpO2 100 %.  PHYSICAL EXAMINATION:  GENERAL:  81 y.o.-year-old patient lying in the bed with no acute distress. Obese EYES: Pupils equal, round, reactive to light and accommodation. No scleral icterus. Extraocular muscles intact.  HEENT: Head atraumatic, normocephalic. Oropharynx and nasopharynx clear.  NECK:  Supple, no jugular venous distention. No thyroid enlargement, no tenderness.  LUNGS: Moderate breath sounds bilaterally, no wheezing, rales,rhonchi or crepitation. No use of accessory muscles of respiration.  CARDIOVASCULAR: S1, S2 normal. No murmurs, rubs, or gallops.  ABDOMEN: Soft, nontender,  obese. Bowel sounds present. No organomegaly or mass. Chronic indwelling Foley catheter EXTREMITIES: No pedal edema, cyanosis, or clubbing.  NEUROLOGIC: Cranial nerves II through XII are grossly intact.intermittent episodes of pleasant confusion noticed  PSYCHIATRIC: The patient is alert and oriented x 2-3.  SKIN: No obvious rash, lesion, or ulcer.    LABORATORY PANEL:   CBC  Recent Labs Lab 02/09/16 0613  WBC 14.8*  HGB 9.2*  HCT 28.5*  PLT 191   ------------------------------------------------------------------------------------------------------------------  Chemistries   Recent Labs Lab 02/09/16 0613  NA 129*  K 4.3  CL 100*  CO2 19*  GLUCOSE 126*  BUN 36*  CREATININE 1.32*  CALCIUM 7.9*   ------------------------------------------------------------------------------------------------------------------  Cardiac Enzymes  Recent Labs Lab 02/07/16 1254  TROPONINI <0.03   ------------------------------------------------------------------------------------------------------------------  RADIOLOGY:  Dg Chest 1 View  02/07/2016  CLINICAL DATA:  Weakness and shortness of breath EXAM: CHEST 1 VIEW COMPARISON:  January 13, 2016 FINDINGS: No pneumothorax. Low lung volumes limit evaluation. Cardiomegaly stable. The hila and mediastinum are unchanged. No pulmonary nodules or masses. Mild vascular congestion without overt edema. No focal infiltrate identified. IMPRESSION: Mild vascular congestion without overt edema. No other acute abnormalities. Electronically Signed   By: Dorise Bullion III M.D   On: 02/07/2016 13:59   Dg Chest Port 1 View  02/09/2016  CLINICAL DATA:  Followup bibasilar atelectasis. Patient admitted 02/07/2016 for sepsis. EXAM: PORTABLE CHEST 1 VIEW COMPARISON:  02/07/2016 and earlier. FINDINGS: Markedly suboptimal inspiration. Airspace consolidation with air bronchograms in the medial left lower lobe. Lungs otherwise clear. Pulmonary vascularity normal. No  visible pleural effusions. Cardiac silhouette moderately enlarged, unchanged. IMPRESSION: Markedly suboptimal inspiration. Developing medial left lower lobe atelectasis  and/or pneumonia. Electronically Signed   By: Evangeline Dakin M.D.   On: 02/09/2016 08:11    EKG:   Orders placed or performed during the hospital encounter of 02/07/16  . ED EKG  . ED EKG  . EKG 12-Lead  . EKG 12-Lead    ASSESSMENT AND PLAN:    * Severe sepsis likely from catheter related urinary tract infection/we are you sepsis   acute encephalopathy resolved Patient has chronic Foley catheter, that has been changed in the ED yesterday at the time of admission. Urinalysis with 1+ leukoesterase, urine culture with no growth in one day  blood culture 1 bottle is with VRE sensitive to daptomycin, we will continue the patient on daptomycin, approved by [facility] Discontinue vancomycin and Zosyn IV fluids-normal saline at 75 and per hour with close monitoring for symptoms and signs of fluid overload  Lactic acid is trending down at 1.9   * Acute encephalopathy likely combination of infection and respiratory failure Clinically improved    * Acute renal failure with hyponatremia  sodium at 121-127, 128 today at 129 Renal function is improving creatinine 1.8 to-1.32 today- Will closely monitor intake and output  continue IV fluids normal saline at 75 mg/h  TSH is normal. Triglycerides at 169 and total cholesterol 211, LDL at 112  appreciate nephrology recommendations   * Acute on chronic respiratory failure, hypercarbic Off BiPAP. Clinically improving. Appreciate pulmonology recommendations Will continue BIPAP daily at bedtime for possible underlying obstructive sleep apnea  * Chronic diastolic CHF No overt edema on the chest x-ray. She does seem to have chronic anasarca.  * Morbid obesity Likely has sleep apnea. Will need CPAP daily at bedtime. Outpatient follow-up with pulmonary.   * DVT prophylaxis with  Lovenox    All the records are reviewed and case discussed with Care Management/Social Workerr. Management plans discussed with the patient,  And she is in agreement.  CODE STATUS: DO NOT RESUSCITATE  TOTAL critical care TIME TAKING CARE OF THIS PATIENT: 40 minutes.   POSSIBLE D/C IN 2-3 DAYS, DEPENDING ON CLINICAL CONDITION.   Nicholes Mango M.D on 02/09/2016 at 12:46 PM  Between 7am to 6pm - Pager - [phone] After 6pm go to [email or web address] - password [email or web address] [facility]  [place] [facility]  Office  [phone]  CC: Primary care physician; Margarita Rana, MD

## 2016-02-09 NOTE — Consult Note (Signed)
I agree with using daptomycin. Scharlene Gloss, MD

## 2016-02-10 LAB — URINE CULTURE: Culture: NO GROWTH

## 2016-02-10 LAB — CBC
HCT: 30 % — ABNORMAL LOW (ref 35.0–47.0)
Hemoglobin: 9.8 g/dL — ABNORMAL LOW (ref 12.0–16.0)
MCH: 31.3 pg (ref 26.0–34.0)
MCHC: 32.7 g/dL (ref 32.0–36.0)
MCV: 95.9 fL (ref 80.0–100.0)
PLATELETS: 211 10*3/uL (ref 150–440)
RBC: 3.13 MIL/uL — ABNORMAL LOW (ref 3.80–5.20)
RDW: 15.5 % — AB (ref 11.5–14.5)
WBC: 11.3 10*3/uL — AB (ref 3.6–11.0)

## 2016-02-10 LAB — BASIC METABOLIC PANEL
Anion gap: 4 — ABNORMAL LOW (ref 5–15)
BUN: 25 mg/dL — AB (ref 6–20)
CALCIUM: 8.6 mg/dL — AB (ref 8.9–10.3)
CHLORIDE: 107 mmol/L (ref 101–111)
CO2: 25 mmol/L (ref 22–32)
CREATININE: 0.78 mg/dL (ref 0.44–1.00)
GFR calc Af Amer: >60 mL/min (ref >60)
Glucose, Bld: 102 mg/dL — ABNORMAL HIGH (ref 65–99)
Potassium: 4.4 mmol/L (ref 3.5–5.1)
SODIUM: 136 mmol/L (ref 135–145)

## 2016-02-10 MED ORDER — SODIUM CHLORIDE 0.9 % IV SOLN
750.0000 mg | INTRAVENOUS | Status: DC
  Administered 2016-02-10: 750 mg via INTRAVENOUS
  Filled 2016-02-10 (×2): qty 15

## 2016-02-10 MED ORDER — GUAIFENESIN-DM 100-10 MG/5ML PO SYRP
5.0000 mL | ORAL_SOLUTION | Freq: Four times a day (QID) | ORAL | Status: DC | PRN
  Administered 2016-02-10: 5 mL via ORAL
  Filled 2016-02-10: qty 5

## 2016-02-10 NOTE — Progress Notes (Signed)
[place] at [place] NAME: Kathy Guzman    MR#:  [id number]  DATE OF BIRTH:  10-19-1935  SUBJECTIVE:  CHIEF COMPLAINT:  Patient is more  awake and alert and uncomfortable to use BiPAP at bedtime  Patient is mostly bedbound status  REVIEW OF SYSTEMS:  CONSTITUTIONAL: No fever, fatigue or weakness. Obese EYES: No blurred or double vision.  EARS, NOSE, AND THROAT: No tinnitus or ear pain.  RESPIRATORY: No cough, shortness of breath, wheezing or hemoptysis.  CARDIOVASCULAR: No chest pain, orthopnea, edema.  GASTROINTESTINAL: No nausea, vomiting, diarrhea or abdominal pain.  GENITOURINARY: No dysuria, hematuria. Chronic indwelling Foley catheter ENDOCRINE: No polyuria, nocturia,  HEMATOLOGY: No anemia, easy bruising or bleeding SKIN: No rash or lesion. MUSCULOSKELETAL: No joint pain or arthritis.   NEUROLOGIC: No tingling, numbness, weakness.  PSYCHIATRY: No anxiety or depression.   DRUG ALLERGIES:   Allergies  Allergen Reactions  . Nsaids Other (See Comments)    Reaction:  Unknown   . Tramadol Itching    VITALS:  Blood pressure 120/58, pulse 99, temperature 97.1 F (36.2 C), temperature source Oral, resp. rate 17, height 5\' 2"  (1.575 m), weight 160.7 kg (354 lb 4.5 oz), SpO2 100 %.  PHYSICAL EXAMINATION:  GENERAL:  81 y.o.-year-old patient lying in the bed with no acute distress. Obese EYES: Pupils equal, round, reactive to light and accommodation. No scleral icterus. Extraocular muscles intact.  HEENT: Head atraumatic, normocephalic. Oropharynx and nasopharynx clear.  NECK:  Supple, no jugular venous distention. No thyroid enlargement, no tenderness.  LUNGS: Moderate breath sounds bilaterally, no wheezing, rales,rhonchi or crepitation. No use of accessory muscles of respiration.  CARDIOVASCULAR: S1, S2 normal. No murmurs, rubs, or gallops.  ABDOMEN: Soft, nontender, obese. Bowel sounds present. No organomegaly or mass.  Chronic indwelling Foley catheter EXTREMITIES: No pedal edema, cyanosis, or clubbing.  NEUROLOGIC: Cranial nerves II through XII are grossly intact.intermittent episodes of pleasant confusion noticed  PSYCHIATRIC: The patient is alert and oriented x 2-3.  SKIN: No obvious rash, lesion, or ulcer.    LABORATORY PANEL:   CBC  Recent Labs Lab 02/10/16 0504  WBC 11.3*  HGB 9.8*  HCT 30.0*  PLT 211   ------------------------------------------------------------------------------------------------------------------  Chemistries   Recent Labs Lab 02/10/16 0504  NA 136  K 4.4  CL 107  CO2 25  GLUCOSE 102*  BUN 25*  CREATININE 0.78  CALCIUM 8.6*   ------------------------------------------------------------------------------------------------------------------  Cardiac Enzymes  Recent Labs Lab 02/07/16 1254  TROPONINI <0.03   ------------------------------------------------------------------------------------------------------------------  RADIOLOGY:  Dg Chest Port 1 View  02/09/2016  CLINICAL DATA:  Followup bibasilar atelectasis. Patient admitted 02/07/2016 for sepsis. EXAM: PORTABLE CHEST 1 VIEW COMPARISON:  02/07/2016 and earlier. FINDINGS: Markedly suboptimal inspiration. Airspace consolidation with air bronchograms in the medial left lower lobe. Lungs otherwise clear. Pulmonary vascularity normal. No visible pleural effusions. Cardiac silhouette moderately enlarged, unchanged. IMPRESSION: Markedly suboptimal inspiration. Developing medial left lower lobe atelectasis and/or pneumonia. Electronically Signed   By: Evangeline Dakin M.D.   On: 02/09/2016 08:11    EKG:   Orders placed or performed during the hospital encounter of 02/07/16  . ED EKG  . ED EKG  . EKG 12-Lead  . EKG 12-Lead    ASSESSMENT AND PLAN:    * Severe sepsis likely from catheter related urinary tract infection/we are you sepsis   acute encephalopathy resolved Patient has chronic Foley  catheter, that has been changed in the ED yesterday at the time of  admission. Urinalysis with 1+ leukoesterase, urine culture with no growth in one day  blood culture 1 bottle is with VRE sensitive to daptomycin, we will continue the patient on daptomycin, approved by [facility], discussed with [facility] through the weekend as pharmacy needs approval from ID. Will follow up with Dr. Ola Spurr recommendations Discontinue vancomycin and Zosyn IV fluids-normal saline at 75 and per hour with close monitoring for symptoms and signs of fluid overload  Lactic acid is trending down at 1.9   * Acute encephalopathy likely combination of infection and respiratory failure Clinically improved    * Acute renal failure with hyponatremia -resolved sodium at 121-127, 128 , 129-136 Renal function is improving creatinine 1.8 to-1.32 today-0.78 Will closely monitor intake and output  Given IV fluids normal saline at 75 mg/h. Will discontinue as the patient is eating well  TSH is normal. Triglycerides at 169 and total cholesterol 211, LDL at 112  appreciate nephrology recommendations   * Acute on chronic respiratory failure, hypercarbic Off BiPAP. Clinically improving. Appreciate pulmonology recommendations Will continue BIPAP daily at bedtime for possible underlying obstructive sleep apnea  * Chronic diastolic CHF No overt edema on the chest x-ray. She does seem to have chronic anasarca.  * Morbid obesity Likely has sleep apnea. Will need CPAP daily at bedtime. Outpatient follow-up with pulmonary.   * DVT prophylaxis with Lovenox  *PT is recommending no PT needs, patient is mostly bedbound    All the records are reviewed and case discussed with Care Management/Social Workerr. Management plans discussed with the patient,  And she is in agreement.  CODE STATUS: DO NOT RESUSCITATE  TOTAL  TIME TAKING CARE OF THIS PATIENT: 35  minutes.   POSSIBLE D/C IN 2-3 DAYS, DEPENDING ON CLINICAL  CONDITION.   Nicholes Mango M.D on 02/10/2016 at 3:09 PM  Between 7am to 6pm - Pager - [phone] After 6pm go to [email or web address] - password [email or web address] [facility]  [place] [facility]  Office  [phone]  CC: Primary care physician; Margarita Rana, MD

## 2016-02-10 NOTE — Progress Notes (Addendum)
Pharmacy Antibiotic Note- Daptomycin Day 3   Kathy Guzman is a 81 y.o. female admitted on 02/07/2016 with sepsis. Patient consulted for daptomycin dosing for VRE showing in 1/2 sets.   Plan: Continue daptomycin 750mg  IV q24 hours. Recommend monitoring CK week.   Height: 5\' 2"  (157.5 cm) Weight: (!) 354 lb 4.5 oz (160.7 kg) IBW/kg (Calculated) : 50.1  Temp (24hrs), Avg:97.1 F (36.2 C), Min:97.1 F (36.2 C), Max:97.1 F (36.2 C)   Recent Labs Lab 02/07/16 1254 02/07/16 1610 02/07/16 1917 02/08/16 0522 02/09/16 0613 02/10/16 0504  WBC 21.0*  --   --  20.9* 14.8* 11.3*  CREATININE 1.69*  --   --  1.82* 1.32* 0.78  LATICACIDVEN  --  2.3* 1.9  --   --   --     Estimated Creatinine Clearance: 86.3 mL/min (by C-G formula based on Cr of 0.78).    Allergies  Allergen Reactions  . Nsaids Other (See Comments)    Reaction:  Unknown   . Tramadol Itching    Antimicrobials this admission: 02/07/16 Zosyn  >> 02/08/16 02/08/16 Daptomycin 750mg  >>   Microbiology results: 02/08/16 BCx: enterococcus Lucianne Lei A/B positive - VRE 1 of 2 sets.  02/08/15 UCx: no growth     Pharmacy will continue to monitor and adjust per consult.   Currie Paris  02/10/2016 2:19 PM

## 2016-02-10 NOTE — Consult Note (Signed)
[facility]     Reason for Consult:VRE bacteremia    Referring Physician: Gouru Date of Admission:  02/07/2016   Active Problems:   Sepsis ([place])   Pressure ulcer   HPI: Kathy Guzman is a 81 y.o. female female who resides at a NH with hx of CHF, COPD, morbid obesity, chronic indwelling foley cath admitted 4/7 with increasing lethargy and hypoxia.  WBC 20 on admit and cr 1.82. UA wth 6-30, UCX    Since admission bcx + VRE and started on daptomycin.  Her UA and UCX were neg but done after abx done.   TTE echo neg. Clinically improving and back to baseline  Past Medical History  Diagnosis Date  . CHF (congestive heart failure) ([place])   . Hypertension   . Hyperlipidemia   . COPD (chronic obstructive pulmonary disease) ([place])   . GERD (gastroesophageal reflux disease)   . Hypothyroid   . Osteoarthritis   . Depression   . Anxiety   . Skin cancer   . Urethral stricture   . Gout   . Recurrent UTI    Past Surgical History  Procedure Laterality Date  . Partial hysterectomy    . Cholecystectomy     Social History  Substance Use Topics  . Smoking status: Former Smoker    Quit date: 03/18/1994  . Smokeless tobacco: Never Used  . Alcohol Use: No   Family History  Problem Relation Age of Onset  . Colon cancer Brother   . Heart attack Father     Allergies:  Allergies  Allergen Reactions  . Nsaids Other (See Comments)    Reaction:  Unknown   . Tramadol Itching    Current antibiotics: Antibiotics Given (last 72 hours)    Date/Time Action Medication Dose Rate   02/08/16 0043 Given   piperacillin-tazobactam (ZOSYN) IVPB 3.375 g 3.375 g 12.5 mL/hr   02/08/16 0445 Given   piperacillin-tazobactam (ZOSYN) IVPB 3.375 g 3.375 g 12.5 mL/hr   02/08/16 1400 Given   DAPTOmycin (CUBICIN) 750 mg in sodium chloride 0.9 % IVPB 750 mg 230 mL/hr   02/09/16 1446 Given   DAPTOmycin (CUBICIN) 750 mg in sodium chloride 0.9 % IVPB 750 mg 230 mL/hr       MEDICATIONS: . antiseptic oral rinse  7 mL Mouth Rinse BID  . DAPTOmycin (CUBICIN)  IV  750 mg Intravenous Q24H  . docusate sodium  100 mg Oral BID  . enoxaparin (LOVENOX) injection  40 mg Subcutaneous Q12H  . sodium chloride flush  3 mL Intravenous Q12H    Review of Systems - 11 systems reviewed and negative per HPI   OBJECTIVE: Temp:  [97.1 F (36.2 C)] 97.1 F (36.2 C) (04/10 0800) Pulse Rate:  [89-110] 104 (04/10 1500) Resp:  [14-24] 22 (04/10 1500) BP: (96-140)/(30-117) 111/56 mmHg (04/10 1400) SpO2:  [97 %-100 %] 100 % (04/10 1500) Weight:  [160.7 kg (354 lb 4.5 oz)] 160.7 kg (354 lb 4.5 oz) (04/10 3664)  Constitutional: Morbidly obese, lying in bed,  HENT: [place]/AT, PERRLA, no scleral icterus Mouth/Throat: Oropharynx is clear and moist.edentulous No oropharyngeal exudate.  Cardiovascular: Normal rate, regular rhythm  Pulmonary/Chest: diffuse wheeze, no crackles Neck = supple, no nuchal rigidity Abdominal: Soft. Bowel sounds are normal. exhibits no distension. There is no tenderness.  Lymphadenopathy: no cervical adenopathy. No axillary adenopathy Neurological: alert and oriented to person, place, and time.  Skin: no rash, multiple exocrciations on abd, none appear infected Psychiatric: a normal mood and affect.  behavior is normal.   LABS: Results for orders placed or performed during the hospital encounter of 02/07/16 (from the past 48 hour(s))  Sodium     Status: Abnormal   Collection Time: 02/08/16  5:43 PM  Result Value Ref Range   Sodium 127 (L) 135 - 145 mmol/L  Sodium     Status: Abnormal   Collection Time: 02/08/16 11:55 PM  Result Value Ref Range   Sodium 128 (L) 135 - 145 mmol/L  Basic metabolic panel     Status: Abnormal   Collection Time: 02/09/16  6:13 AM  Result Value Ref Range   Sodium 129 (L) 135 - 145 mmol/L   Potassium 4.3 3.5 - 5.1 mmol/L   Chloride 100 (L) 101 - 111 mmol/L   CO2 19 (L) 22 - 32 mmol/L   Glucose, Bld 126 (H) 65 - 99  mg/dL   BUN 36 (H) 6 - 20 mg/dL   Creatinine, Ser 1.32 (H) 0.44 - 1.00 mg/dL   Calcium 7.9 (L) 8.9 - 10.3 mg/dL   GFR calc non Af Amer 38 (L) >60 mL/min   GFR calc Af Amer 44 (L) >60 mL/min    Comment: (NOTE) The eGFR has been calculated using the CKD EPI equation. This calculation has not been validated in all clinical situations. eGFR's persistently <60 mL/min signify possible Chronic Kidney Disease.    Anion gap 10 5 - 15  CBC     Status: Abnormal   Collection Time: 02/09/16  6:13 AM  Result Value Ref Range   WBC 14.8 (H) 3.6 - 11.0 K/uL   RBC 3.07 (L) 3.80 - 5.20 MIL/uL   Hemoglobin 9.2 (L) 12.0 - 16.0 g/dL   HCT 28.5 (L) 35.0 - 47.0 %   MCV 92.7 80.0 - 100.0 fL   MCH 30.1 26.0 - 34.0 pg   MCHC 32.4 32.0 - 36.0 g/dL   RDW 14.7 (H) 11.5 - 14.5 %   Platelets 191 150 - 440 K/uL  TSH     Status: None   Collection Time: 02/09/16  6:13 AM  Result Value Ref Range   TSH 2.526 0.350 - 4.500 uIU/mL  Lipid panel     Status: Abnormal   Collection Time: 02/09/16  6:13 AM  Result Value Ref Range   Cholesterol 211 (H) 0 - 200 mg/dL   Triglycerides 169 (H) <150 mg/dL   HDL 65 >40 mg/dL   Total CHOL/HDL Ratio 3.2 RATIO   VLDL 34 0 - 40 mg/dL   LDL Cholesterol 112 (H) 0 - 99 mg/dL    Comment:        Total Cholesterol/HDL:CHD Risk Coronary Heart Disease Risk Table                     Men   Women  1/2 Average Risk   3.4   3.3  Average Risk       5.0   4.4  2 X Average Risk   9.6   7.1  3 X Average Risk  23.4   11.0        Use the calculated Patient Ratio above and the CHD Risk Table to determine the patient's CHD Risk.        ATP III CLASSIFICATION (LDL):  <100     mg/dL   Optimal  100-129  mg/dL   Near or Above                    Optimal  130-159  mg/dL   Borderline  160-189  mg/dL   High  >190     mg/dL   Very High   Sodium     Status: Abnormal   Collection Time: 02/09/16 12:04 PM  Result Value Ref Range   Sodium 132 (L) 135 - 145 mmol/L  Sodium     Status: Abnormal    Collection Time: 02/09/16  5:23 PM  Result Value Ref Range   Sodium 132 (L) 135 - 145 mmol/L  Sodium     Status: Abnormal   Collection Time: 02/09/16 11:33 PM  Result Value Ref Range   Sodium 134 (L) 135 - 145 mmol/L  Basic metabolic panel     Status: Abnormal   Collection Time: 02/10/16  5:04 AM  Result Value Ref Range   Sodium 136 135 - 145 mmol/L   Potassium 4.4 3.5 - 5.1 mmol/L   Chloride 107 101 - 111 mmol/L   CO2 25 22 - 32 mmol/L   Glucose, Bld 102 (H) 65 - 99 mg/dL   BUN 25 (H) 6 - 20 mg/dL   Creatinine, Ser 0.78 0.44 - 1.00 mg/dL   Calcium 8.6 (L) 8.9 - 10.3 mg/dL   GFR calc non Af Amer >60 >60 mL/min   GFR calc Af Amer >60 >60 mL/min    Comment: (NOTE) The eGFR has been calculated using the CKD EPI equation. This calculation has not been validated in all clinical situations. eGFR's persistently <60 mL/min signify possible Chronic Kidney Disease.    Anion gap 4 (L) 5 - 15  CBC     Status: Abnormal   Collection Time: 02/10/16  5:04 AM  Result Value Ref Range   WBC 11.3 (H) 3.6 - 11.0 K/uL   RBC 3.13 (L) 3.80 - 5.20 MIL/uL   Hemoglobin 9.8 (L) 12.0 - 16.0 g/dL   HCT 30.0 (L) 35.0 - 47.0 %   MCV 95.9 80.0 - 100.0 fL   MCH 31.3 26.0 - 34.0 pg   MCHC 32.7 32.0 - 36.0 g/dL   RDW 15.5 (H) 11.5 - 14.5 %   Platelets 211 150 - 440 K/uL   No components found for: ESR, C REACTIVE PROTEIN MICRO: Recent Results (from the past 720 hour(s))  Blood Culture (routine x 2)     Status: Abnormal (Preliminary result)   Collection Time: 02/07/16  4:10 PM  Result Value Ref Range Status   Specimen Description BLOOD RIGHT CHEST  Final   Special Requests BOTTLES DRAWN AEROBIC AND ANAEROBIC  2CC  Final   Culture  Setup Time   Final    GRAM POSITIVE COCCI IN CHAINS ANAEROBIC BOTTLE ONLY CRITICAL RESULT CALLED TO, READ BACK BY AND VERIFIED WITH: SHEEMA HALLAJI 02/08/2016 1148 BY J RAZZAKSUAREZ,MT    Culture (A)  Final    VANCOMYCIN RESISTANT ENTEROCOCCUS ANAEROBIC BOTTLE  ONLY ADDITIONAL SUSCEPTIBILITIES TO FOLLOW    Report Status PENDING  Incomplete  Blood Culture (routine x 2)     Status: None (Preliminary result)   Collection Time: 02/07/16  4:10 PM  Result Value Ref Range Status   Specimen Description BLOOD RIGHT SHOULDER  Final   Special Requests BOTTLES DRAWN AEROBIC AND ANAEROBIC  1CC  Final   Culture NO GROWTH 3 DAYS  Final   Report Status PENDING  Incomplete  Blood Culture ID Panel (Reflexed)     Status: Abnormal   Collection Time: 02/07/16  4:10 PM  Result Value Ref Range Status   Enterococcus species DETECTED (A) NOT  DETECTED Final    Comment: CRITICAL RESULT CALLED TO, READ BACK BY AND VERIFIED WITH: SHEEMA HALLAJI 02/08/2016 1148 BY J RAZZAKSUAREZ,MT    Vancomycin resistance DETECTED (A) NOT DETECTED Final    Comment: CRITICAL RESULT CALLED TO, READ BACK BY AND VERIFIED WITH: SHEEMA HALLAJI 02/08/2016 1148 BY J RAZZAKSUAREZ,MT    Listeria monocytogenes NOT DETECTED NOT DETECTED Final   Staphylococcus species NOT DETECTED NOT DETECTED Final   Staphylococcus aureus NOT DETECTED NOT DETECTED Final   Methicillin resistance NOT DETECTED NOT DETECTED Final   Streptococcus species NOT DETECTED NOT DETECTED Final   Streptococcus agalactiae NOT DETECTED NOT DETECTED Final   Streptococcus pneumoniae NOT DETECTED NOT DETECTED Final   Streptococcus pyogenes NOT DETECTED NOT DETECTED Final   Acinetobacter baumannii NOT DETECTED NOT DETECTED Final   Enterobacteriaceae species NOT DETECTED NOT DETECTED Final   Enterobacter cloacae complex NOT DETECTED NOT DETECTED Final   Escherichia coli NOT DETECTED NOT DETECTED Final   Klebsiella oxytoca NOT DETECTED NOT DETECTED Final   Klebsiella pneumoniae NOT DETECTED NOT DETECTED Final   Proteus species NOT DETECTED NOT DETECTED Final   Serratia marcescens NOT DETECTED NOT DETECTED Final   Carbapenem resistance NOT DETECTED NOT DETECTED Final   Haemophilus influenzae NOT DETECTED NOT DETECTED Final    Neisseria meningitidis NOT DETECTED NOT DETECTED Final   Pseudomonas aeruginosa NOT DETECTED NOT DETECTED Final   Candida albicans NOT DETECTED NOT DETECTED Final   Candida glabrata NOT DETECTED NOT DETECTED Final   Candida krusei NOT DETECTED NOT DETECTED Final   Candida parapsilosis NOT DETECTED NOT DETECTED Final   Candida tropicalis NOT DETECTED NOT DETECTED Final  Urine culture     Status: None   Collection Time: 02/08/16  9:27 AM  Result Value Ref Range Status   Specimen Description URINE, RANDOM  Final   Special Requests NONE  Final   Culture NO GROWTH 1 DAY  Final   Report Status 02/10/2016 FINAL  Final    IMAGING: Dg Chest 1 View  02/07/2016  CLINICAL DATA:  Weakness and shortness of breath EXAM: CHEST 1 VIEW COMPARISON:  January 13, 2016 FINDINGS: No pneumothorax. Low lung volumes limit evaluation. Cardiomegaly stable. The hila and mediastinum are unchanged. No pulmonary nodules or masses. Mild vascular congestion without overt edema. No focal infiltrate identified. IMPRESSION: Mild vascular congestion without overt edema. No other acute abnormalities. Electronically Signed   By: Dorise Bullion III M.D   On: 02/07/2016 13:59   Dg Chest 1 View  01/13/2016  CLINICAL DATA:  Shortness of breath today.  Initial encounter. EXAM: CHEST 1 VIEW COMPARISON:  Single-view of the chest 01/07/2016 and 01/09/2016. FINDINGS: There is cardiomegaly and mild vascular congestion. Aeration appears somewhat improved compared to the most recent study. Left basilar atelectasis is noted. No pneumothorax. IMPRESSION: Cardiomegaly and vascular congestion. Aeration appears mildly improved since the most recent study. Electronically Signed   By: Inge Rise M.D.   On: 01/13/2016 07:47   Dg Chest Port 1 View  02/09/2016  CLINICAL DATA:  Followup bibasilar atelectasis. Patient admitted 02/07/2016 for sepsis. EXAM: PORTABLE CHEST 1 VIEW COMPARISON:  02/07/2016 and earlier. FINDINGS: Markedly suboptimal  inspiration. Airspace consolidation with air bronchograms in the medial left lower lobe. Lungs otherwise clear. Pulmonary vascularity normal. No visible pleural effusions. Cardiac silhouette moderately enlarged, unchanged. IMPRESSION: Markedly suboptimal inspiration. Developing medial left lower lobe atelectasis and/or pneumonia. Electronically Signed   By: Evangeline Dakin M.D.   On: 02/09/2016 08:11    Assessment:  Kathy Guzman is a 81 y.o. female female who resides at a NH with hx of CHF, COPD, morbid obesity, chronic indwelling foley cath admitted 4/7 with increasing lethargy and hypoxia. Found to have VRE bacteremia- unclear source, UA was neg and UCx neg but done after abx for a day. Clinically improving, TTE neg.  Recommendations Continue daptomycin while inpatient but can change to oral linezolid to complete a total 10 day course for the enterococcal bacteremia.   Thank you very much for allowing me to participate in the care of this patient. Please call with questions.   Cheral Marker. Ola Spurr, MD

## 2016-02-10 NOTE — Evaluation (Signed)
Physical Therapy Evaluation Patient Details Name: Kathy Guzman MRN: [id number] DOB: November 24, 1934 Today's Date: 02/10/2016   History of Present Illness  Pt is a 81 y.o. female with PMH of HTN, CHF, COPD, (chronic home O2 at 3 L/min via nasal cannula), and chronic respiratory failure.  Pt presented with O2 desaturation into the 50's at night, weakness and decreased appetite.  Pt was admitted for sepsis, acute encepalopahy, acute renal failure, acute on chronic respiratory failure.      Clinical Impression  Prior to admission pt was dependent.  Pt reported that she uses a hoyer lift for all transfers, and also needs assistance with feeding herself.  Pt also reported that she needs 2 to 4 people to assist with rolling in bed and has not sat EOB or stood up in over a year.  Pt lives in a nursing home.  Pt was max assist x3 for rolling.  Pt's vitals were: beginning of session: 100% O2, rolling: 99% O2 (to the R) 87% O2 (To the L), end of session: 100% (3 L/min O2 via nasal cannula during the entire session), HR: 100-105 bpm during the entire session.  Due pt being at baseline, pt does not need further PT services.  Pt will be discharged in house and will complete PT order.      Follow Up Recommendations No PT follow up    Equipment Recommendations  None recommended by PT    Recommendations for Other Services       Precautions / Restrictions Precautions Precautions: Fall Precaution Comments: Bed bound Restrictions Weight Bearing Restrictions: No      Mobility  Bed Mobility Overal bed mobility: Needs Assistance;+3 for physical assistance Bed Mobility: Rolling Rolling: Max assist;+3 for physical assistance            Transfers                 General transfer comment: not assessed due to baseline pt uses hoyer lift and max x3 assist for bed rolling.   Ambulation/Gait                Stairs            Wheelchair Mobility    Modified Rankin (Stroke Patients  Only)       Balance                                             Pertinent Vitals/Pain Pain Assessment: 0-10 Pain Score: 8  Pain Location: head Pain Descriptors / Indicators: Aching;Grimacing;Discomfort Pain Intervention(s): Limited activity within patient's tolerance;Monitored during session;Repositioned  See flow sheet for vitals.     Home Living Family/patient expects to be discharged to:: Skilled nursing facility                      Prior Function Level of Independence: Needs assistance         Comments: At baseline, pt is bed bound, uses lift for mobility. Pt reports she has not stood in 1 year.  Pt also reported that she needs assistance with feeding.       Hand Dominance   Dominant Hand: Right    Extremity/Trunk Assessment   Upper Extremity Assessment: Generalized weakness           Lower Extremity Assessment: Generalized weakness      Cervical / Trunk Assessment: Normal  Communication   Communication: No difficulties  Cognition Arousal/Alertness: Awake/alert Behavior During Therapy: WFL for tasks assessed/performed Overall Cognitive Status: Within Functional Limits for tasks assessed                      General Comments   Nursing was contacted and cleared pt for physical therapy.  Pt was agreeable and session was modified due to fatigue.      Exercises        Assessment/Plan    PT Assessment Patent does not need any further PT services  PT Diagnosis Generalized weakness   PT Problem List    PT Treatment Interventions     PT Goals (Current goals can be found in the Care Plan section) Acute Rehab PT Goals Patient Stated Goal: to go back to her facility  PT Goal Formulation: With patient Time For Goal Achievement: 02/24/16 Potential to Achieve Goals: Good    Frequency     Barriers to discharge        Co-evaluation               End of Session Equipment Utilized During Treatment: Oxygen  (3 L/min O2 via nasal cannula ) Activity Tolerance: Patient limited by fatigue;Other (comment) (session limited due to weakness ) Patient left: in bed;with call bell/phone within reach Nurse Communication: Precautions;Mobility status         Time: [id number] PT Time Calculation (min) (ACUTE ONLY): 29 min   Charges:         PT G Codes:       Mittie Bodo, SPT  Mittie Bodo 02/10/2016, 12:27 PM

## 2016-02-10 NOTE — Care Management (Addendum)
Patient is readmission from [facility].  Readmission is related to disease stated- unpreventable.   Admitted to icu stepdown.  She is on chronic 02 and bipap at [facility]. Entered order for CSW (later found a competed CSW order that was not showing in current active orders.)

## 2016-02-11 LAB — BASIC METABOLIC PANEL
Anion gap: 2 — ABNORMAL LOW (ref 5–15)
BUN: 16 mg/dL (ref 6–20)
CHLORIDE: 110 mmol/L (ref 101–111)
CO2: 25 mmol/L (ref 22–32)
CREATININE: 0.67 mg/dL (ref 0.44–1.00)
Calcium: 9.1 mg/dL (ref 8.9–10.3)
GFR calc Af Amer: >60 mL/min (ref >60)
GFR calc non Af Amer: >60 mL/min (ref >60)
GLUCOSE: 98 mg/dL (ref 65–99)
POTASSIUM: 4.1 mmol/L (ref 3.5–5.1)
SODIUM: 137 mmol/L (ref 135–145)

## 2016-02-11 LAB — CULTURE, BLOOD (ROUTINE X 2)

## 2016-02-11 MED ORDER — CLONAZEPAM 0.5 MG PO TABS: 0.5000 mg | ORAL_TABLET | Freq: Every day | ORAL | Status: DC

## 2016-02-11 MED ORDER — DOCUSATE SODIUM 100 MG PO CAPS: 100.0000 mg | ORAL_CAPSULE | Freq: Two times a day (BID) | ORAL | Status: DC

## 2016-02-11 MED ORDER — PRO-STAT SUGAR FREE PO LIQD: 30.0000 mL | Freq: Two times a day (BID) | ORAL | Status: DC

## 2016-02-11 MED ORDER — BISACODYL 5 MG PO TBEC: 5.0000 mg | DELAYED_RELEASE_TABLET | Freq: Every day | ORAL | Status: AC | PRN

## 2016-02-11 MED ORDER — LINEZOLID 600 MG PO TABS: 600.0000 mg | ORAL_TABLET | Freq: Two times a day (BID) | ORAL | Status: DC

## 2016-02-11 MED ORDER — PRO-STAT SUGAR FREE PO LIQD: 30.0000 mL | Freq: Two times a day (BID) | ORAL | Status: AC

## 2016-02-11 NOTE — Clinical Social Work Note (Signed)
Pt is ready for discharge today and will return to [facility]. Pt is aware and agreeable to discharge plan. Facility is able to accept pt as they have received discharge information. Pt shared that her family is well aware of pt's discharge and return to facility. RN to call report and EMS will provide transportation. CSW is signing off as no further needs identified.   Darden Dates, MSW, LCSW  Clinical Social Worker  [phone]

## 2016-02-11 NOTE — Progress Notes (Signed)
02/11/2016  BP 147/56 mmHg  Pulse 91  Temp(Src) 98.1 F (36.7 C) (Oral)  Resp 22  Ht 5\' 2"  (1.575 m)  Wt 159.666 kg (352 lb)  BMI 64.37 kg/m2  SpO2 100% Patient discharged to [facility] per MD orders. Report called to [employer], RN at facility. IV removed per policy.  Discharged via stretcher escorted by EMS on 3 L O2.  Almedia Balls, RN

## 2016-02-11 NOTE — Progress Notes (Signed)
Spoke with Jonelle Sidle, [employer] rep at [phone], to notify of non-emergent EMS transport.  Auth notification reference given as [id number].   Service date range good from 02/11/16 - 05/11/16.   Gap exception requested to determine if services can be considered at an in-network level.

## 2016-02-11 NOTE — Progress Notes (Signed)
Initial Nutrition Assessment  DOCUMENTATION CODES:   Morbid obesity  INTERVENTION:  -Recommend prostat BID for added nutrition. Discussed foods high in protein -Monitor intake and cater to pt preferences. Pt may benefit from heart healthy/carb modified diet secondary to PMHx   NUTRITION DIAGNOSIS:   Inadequate protein intake related to wound healing as evidenced by other (see comment) (stage II pressure ulcer).    GOAL:   Patient will meet greater than or equal to 90% of their needs    MONITOR:   PO intake, Supplement acceptance, Skin  REASON FOR ASSESSMENT:   Low Braden    ASSESSMENT:   81 y/o female admitted with VRE Bactermia, severe sepsis from UTI with chronic indwelling foley Past Medical History  Diagnosis Date  . CHF (congestive heart failure) ([place])   . Hypertension   . Hyperlipidemia   . COPD (chronic obstructive pulmonary disease) ([place])   . GERD (gastroesophageal reflux disease)   . Hypothyroid   . Osteoarthritis   . Depression   . Anxiety   . Skin cancer   . Urethral stricture   . Gout   . Recurrent UTI     Per I and O sheet intake mostly 85-100% of meals.   Breakfast has not yet been delivered this am. Pt reports eating chicken and mashed potatoes for dinner last night but typically does not like much meat. Reports normal intake prior to admission  Medications reviewed: colace Labs reviewed: WBC 11.3, Hgb 9.8, TG 169, cholesterol 211, LDL 112, Na 137   Nutrition-Focused physical exam completed. Findings are no fat depletion, no muscle depletion, and mild edema.     Diet Order:  DIET SOFT Room service appropriate?: Yes; Fluid consistency:: Thin  Skin:   (pressure ulcer stage II noted on buttock)  Last BM:  02/11/2016  Height:   Ht Readings from Last 1 Encounters:  02/07/16 5\' 2"  (1.575 m)    Weight:   Wt Readings from Last 1 Encounters:  02/11/16 352 lb (159.666 kg)    Ideal Body Weight:     BMI:  Body mass index is 64.37  kg/(m^2).  Estimated Nutritional Needs:   Kcal:  [id number] kcals/d. (Using IBW of 52kg)  Protein:  60-78 g/d (Using IBW 52kg)  Fluid:  1.5-2 L/d  EDUCATION NEEDS:   No education needs identified at this time    Nayali Talerico B. Zenia Resides, [place], [place] (pager) Weekend/On-Call pager [phone])

## 2016-02-11 NOTE — Care Management Important Message (Signed)
Important Message  Patient Details  Name: Kathy Guzman MRN: [id number] Date of Birth: 1935-06-01   Medicare Important Message Given:  Yes    Beverly Sessions, RN 02/11/2016, 2:36 PM

## 2016-02-11 NOTE — Progress Notes (Signed)
[facility]  ROUNDING NOTE   Subjective:  Na normalized at 137 at the moment. Pt feeling better as compared to admission.  Renal function has improved further.    Objective:  Vital signs in last 24 hours:  Temp:  [98.1 F (36.7 C)-98.5 F (36.9 C)] 98.4 F (36.9 C) (04/11 1515) Pulse Rate:  [101-108] 102 (04/11 1515) Resp:  [18-20] 18 (04/11 1135) BP: (113-145)/(50-71) 145/50 mmHg (04/11 1515) SpO2:  [93 %-100 %] 93 % (04/11 1515) Weight:  [159.666 kg (352 lb)] 159.666 kg (352 lb) (04/11 0500)  Weight change: -1.034 kg (-2 lb 4.5 oz) Filed Weights   02/09/16 0612 02/10/16 0605 02/11/16 0500  Weight: 160.5 kg (353 lb 13.4 oz) 160.7 kg (354 lb 4.5 oz) 159.666 kg (352 lb)    Intake/Output: I/O last 3 completed shifts: In: 2226 [P.O.:270; I.V.:1956] Out: [id number] [Urine:1550]   Intake/Output this shift:  Total I/O In: -  Out: 400 [Urine:400]  Physical Exam: General: NAD, morbidly obese  Head: Normocephalic, atraumatic. Moist oral mucosal membranes  Eyes: Anicteric  Neck: Supple, trachea midline  Lungs:  Clear to auscultation, normal effort  Heart: S1S2 no rubs  Abdomen:  Soft, nontender, BS present   Extremities: trace peripheral edema.  Neurologic: Nonfocal, moving all four extremities  Skin: No lesions       Basic Metabolic Panel:  Recent Labs Lab 02/07/16 1254 02/08/16 0522  02/09/16 [id number] 02/09/16 1204 02/09/16 1723 02/09/16 2333 02/10/16 0504 02/11/16 0509  NA 124* 121*  < > 129* 132* 132* 134* 136 137  K 5.1 4.5  --  4.3  --   --   --  4.4 4.1  CL 91* 91*  --  100*  --   --   --  107 110  CO2 24 21*  --  19*  --   --   --  25 25  GLUCOSE 151* 102*  --  126*  --   --   --  102* 98  BUN 38* 38*  --  36*  --   --   --  25* 16  CREATININE 1.69* 1.82*  --  1.32*  --   --   --  0.78 0.67  CALCIUM 8.9 7.8*  --  7.9*  --   --   --  8.6* 9.1  < > = values in this interval not displayed.  Liver Function Tests: No results for input(s): AST,  ALT, ALKPHOS, BILITOT, PROT, ALBUMIN in the last 168 hours. No results for input(s): LIPASE, AMYLASE in the last 168 hours. No results for input(s): AMMONIA in the last 168 hours.  CBC:  Recent Labs Lab 02/07/16 1254 02/08/16 0522 02/09/16 0613 02/10/16 0504  WBC 21.0* 20.9* 14.8* 11.3*  NEUTROABS 17.7*  --   --   --   HGB 10.9* 9.2* 9.2* 9.8*  HCT 33.2* 27.7* 28.5* 30.0*  MCV 93.1 92.7 92.7 95.9  PLT 210 169 191 211    Cardiac Enzymes:  Recent Labs Lab 02/07/16 1254 02/08/16 1408  CKTOTAL  --  18*  TROPONINI <0.03  --     BNP: Invalid input(s): POCBNP  CBG:  Recent Labs Lab 02/08/16 0742  GLUCAP 130*    Microbiology: Results for orders placed or performed during the hospital encounter of 02/07/16  Blood Culture (routine x 2)     Status: Abnormal   Collection Time: 02/07/16  4:10 PM  Result Value Ref Range Status   Specimen Description BLOOD RIGHT CHEST  Final   Special Requests BOTTLES DRAWN AEROBIC AND ANAEROBIC  2CC  Final   Culture  Setup Time   Final    GRAM POSITIVE COCCI IN CHAINS ANAEROBIC BOTTLE ONLY CRITICAL RESULT CALLED TO, READ BACK BY AND VERIFIED WITH: SHEEMA HALLAJI 02/08/2016 1148 BY J RAZZAKSUAREZ,MT    Culture (A)  Final    VANCOMYCIN RESISTANT ENTEROCOCCUS ANAEROBIC BOTTLE ONLY VRE HAVE INTRINSIC RESISTANCE TO MOST COMMONLY USED ANTIBIOTICS AND THE ABILITY TO ACQUIRE RESISTANCE TO MOST AVAILABLE ANTIBIOTICS.    Report Status 02/11/2016 FINAL  Final   Organism ID, Bacteria VANCOMYCIN RESISTANT ENTEROCOCCUS  Final      Susceptibility   Vancomycin resistant enterococcus - MIC*    AMPICILLIN >=32 RESISTANT Resistant     VANCOMYCIN >=32 RESISTANT Resistant     GENTAMICIN SYNERGY SENSITIVE Sensitive     LINEZOLID 2 SENSITIVE Sensitive     * VANCOMYCIN RESISTANT ENTEROCOCCUS  Blood Culture (routine x 2)     Status: None (Preliminary result)   Collection Time: 02/07/16  4:10 PM  Result Value Ref Range Status   Specimen Description BLOOD  RIGHT SHOULDER  Final   Special Requests BOTTLES DRAWN AEROBIC AND ANAEROBIC  1CC  Final   Culture NO GROWTH 3 DAYS  Final   Report Status PENDING  Incomplete  Blood Culture ID Panel (Reflexed)     Status: Abnormal   Collection Time: 02/07/16  4:10 PM  Result Value Ref Range Status   Enterococcus species DETECTED (A) NOT DETECTED Final    Comment: CRITICAL RESULT CALLED TO, READ BACK BY AND VERIFIED WITH: SHEEMA HALLAJI 02/08/2016 1148 BY J RAZZAKSUAREZ,MT    Vancomycin resistance DETECTED (A) NOT DETECTED Final    Comment: CRITICAL RESULT CALLED TO, READ BACK BY AND VERIFIED WITH: SHEEMA HALLAJI 02/08/2016 1148 BY J RAZZAKSUAREZ,MT    Listeria monocytogenes NOT DETECTED NOT DETECTED Final   Staphylococcus species NOT DETECTED NOT DETECTED Final   Staphylococcus aureus NOT DETECTED NOT DETECTED Final   Methicillin resistance NOT DETECTED NOT DETECTED Final   Streptococcus species NOT DETECTED NOT DETECTED Final   Streptococcus agalactiae NOT DETECTED NOT DETECTED Final   Streptococcus pneumoniae NOT DETECTED NOT DETECTED Final   Streptococcus pyogenes NOT DETECTED NOT DETECTED Final   Acinetobacter baumannii NOT DETECTED NOT DETECTED Final   Enterobacteriaceae species NOT DETECTED NOT DETECTED Final   Enterobacter cloacae complex NOT DETECTED NOT DETECTED Final   Escherichia coli NOT DETECTED NOT DETECTED Final   Klebsiella oxytoca NOT DETECTED NOT DETECTED Final   Klebsiella pneumoniae NOT DETECTED NOT DETECTED Final   Proteus species NOT DETECTED NOT DETECTED Final   Serratia marcescens NOT DETECTED NOT DETECTED Final   Carbapenem resistance NOT DETECTED NOT DETECTED Final   Haemophilus influenzae NOT DETECTED NOT DETECTED Final   Neisseria meningitidis NOT DETECTED NOT DETECTED Final   Pseudomonas aeruginosa NOT DETECTED NOT DETECTED Final   Candida albicans NOT DETECTED NOT DETECTED Final   Candida glabrata NOT DETECTED NOT DETECTED Final   Candida krusei NOT DETECTED NOT  DETECTED Final   Candida parapsilosis NOT DETECTED NOT DETECTED Final   Candida tropicalis NOT DETECTED NOT DETECTED Final  Urine culture     Status: None   Collection Time: 02/08/16  9:27 AM  Result Value Ref Range Status   Specimen Description URINE, RANDOM  Final   Special Requests NONE  Final   Culture NO GROWTH 1 DAY  Final   Report Status 02/10/2016 FINAL  Final    Coagulation Studies: No  results for input(s): LABPROT, INR in the last 72 hours.  Urinalysis: No results for input(s): COLORURINE, LABSPEC, PHURINE, GLUCOSEU, HGBUR, BILIRUBINUR, KETONESUR, PROTEINUR, UROBILINOGEN, NITRITE, LEUKOCYTESUR in the last 72 hours.  Invalid input(s): APPERANCEUR    Imaging: No results found.   Medications:     . antiseptic oral rinse  7 mL Mouth Rinse BID  . DAPTOmycin (CUBICIN)  IV  750 mg Intravenous Q24H  . docusate sodium  100 mg Oral BID  . enoxaparin (LOVENOX) injection  40 mg Subcutaneous Q12H  . feeding supplement (PRO-STAT SUGAR FREE 64)  30 mL Oral BID  . sodium chloride flush  3 mL Intravenous Q12H   bisacodyl, guaiFENesin-dextromethorphan, ipratropium-albuterol, morphine injection, ondansetron **OR** ondansetron (ZOFRAN) IV, polyethylene glycol  Assessment/ Plan:  81 y.o. female with a PMHx of Congestive heart failure, hypertension, hyperlipidemia, COPD, GERD, hypothyroidism, osteoarthritis, depression, anxiety, history of skin cancer, history of urethral stricture, gout, recurrent urinary tract infections, who was admitted to [facility] on 02/07/2016 for evaluation of lethargy, fatigue, and shortness of breath.   1. Acute renal failure. 2. Hyponatremia.  3. Urinary tract infection. 4.  VRE sepsis.  Plan:  Renal function has improved significantly.  Creatinine down to 0.6.  Serum sodium also now normal at 137.  The patient has done quite well since admission.  Continue daptomycin under the instruction of infectious disease.  No further renal input at this point in  time.  Therefore we will sign off.  Thanks for allowing [place] to participate.   LOS: [place], Keyan Folson 4/11/20174:11 PM

## 2016-02-11 NOTE — Discharge Instructions (Signed)
follow-up with primary care physician at the facility in 3 days   follow up with pulmonology-Dr. Stevenson Clinch in a week Follow-up with cardiology in a week Daily weight monitoring. Follow-up with CHF clinic

## 2016-02-11 NOTE — Discharge Summary (Signed)
[facility] NAME: Kathy Guzman    MR#:  [id number]  DATE OF BIRTH:  16-Apr-1935  DATE OF ADMISSION:  02/07/2016 ADMITTING PHYSICIAN: Hillary Bow, MD  DATE OF DISCHARGE: 02/11/16 PRIMARY CARE PHYSICIAN: Margarita Rana, MD    ADMISSION DIAGNOSIS:  Sepsis, due to unspecified organism ([place]) [A41.9] Altered mental status, unspecified altered mental status type [R41.82]  DISCHARGE DIAGNOSIS:  Sepsis with VRE infection Chronic indwelling Foley catheter Chronic hypoxic respiratory failure on home oxygen via nasal cannula  SECONDARY DIAGNOSIS:   Past Medical History  Diagnosis Date  . CHF (congestive heart failure) ([place])   . Hypertension   . Hyperlipidemia   . COPD (chronic obstructive pulmonary disease) ([place])   . GERD (gastroesophageal reflux disease)   . Hypothyroid   . Osteoarthritis   . Depression   . Anxiety   . Skin cancer   . Urethral stricture   . Gout   . Recurrent UTI     HOSPITAL COURSE:   Severe sepsis likely from catheter related urinary tract infection/we are you sepsis  acute encephalopathy resolved Patient has chronic Foley catheter, that has been changed in the ED yesterday at the time of admission. Urinalysis with 1+ leukoesterase, urine culture with no growth in one day  blood culture 1 bottle is with VRE sensitive to daptomycin, approved by [facility], discussed with [facility] through the weekend as pharmacy needs approval from ID.  Dr. Ola Spurr recommended to switch the patient to by mouth Zyvox to complete a total antibiotic course of 10 days for VRE Discontinued vancomycin and Zosyn IV fluids given Clinically improved    * Acute renal failure with hyponatremia -resolved Given IV fluids normal saline at 75 mg/h. Will discontinue as the patient is eating well TSH is normal. Triglycerides at 169 and total cholesterol 211, LDL at 112 appreciate nephrology recommendations   *  Acute on chronic respiratory failure, hypercarbic Off BiPAP. Clinically improving. Appreciate pulmonology recommendations Will continue BIPAP daily at bedtime for possible underlying obstructive sleep apnea  * Chronic diastolic CHF No overt edema on the chest x-ray. She does seem to have chronic anasarca.  * Morbid obesity Likely has sleep apnea. Will need BIPAP daily at bedtime. Outpatient follow-up with pulmonary.   * DVT prophylaxis with Lovenox  *PT is recommending no PT needs, patient is mostly bedbound    DISCHARGE CONDITIONS:   Fair   CONSULTS OBTAINED:  Treatment Team:  Anthonette Legato, MD Leonel Ramsay, MD Thayer Headings, MD   PROCEDURES -indwelling Foley catheter replaced with a new one at the time of admission  DRUG ALLERGIES:   Allergies  Allergen Reactions  . Nsaids Other (See Comments)    Reaction:  Unknown   . Tramadol Itching    DISCHARGE MEDICATIONS:   Current Discharge Medication List    START taking these medications   Details  Amino Acids-Protein Hydrolys (FEEDING SUPPLEMENT, PRO-STAT SUGAR FREE 64,) LIQD Take 30 mLs by mouth 2 (two) times daily. Qty: 900 mL, Refills: 0    bisacodyl (DULCOLAX) 5 MG EC tablet Take 1 tablet (5 mg total) by mouth daily as needed for moderate constipation. Qty: 30 tablet, Refills: 0    docusate sodium (COLACE) 100 MG capsule Take 1 capsule (100 mg total) by mouth 2 (two) times daily. Qty: 10 capsule, Refills: 0    linezolid (ZYVOX) 600 MG tablet Take 1 tablet (600 mg total) by mouth 2 (two) times daily.  Qty: 14 tablet, Refills: 0      CONTINUE these medications which have CHANGED   Details  clonazePAM (KLONOPIN) 0.5 MG tablet Take 1 tablet (0.5 mg total) by mouth at bedtime. Qty: 20 tablet, Refills: 0      CONTINUE these medications which have NOT CHANGED   Details  acetaminophen (TYLENOL) 500 MG tablet Take 500 mg by mouth every 8 (eight) hours as needed for mild pain.     albuterol (PROVENTIL  HFA;VENTOLIN HFA) 108 (90 BASE) MCG/ACT inhaler Inhale 2 puffs into the lungs every 4 (four) hours as needed for wheezing or shortness of breath.    allopurinol (ZYLOPRIM) 100 MG tablet Take 100 mg by mouth daily.     budesonide-formoterol (SYMBICORT) 160-4.5 MCG/ACT inhaler Inhale 2 puffs into the lungs 2 (two) times daily.    calcium carbonate (TUMS) 500 MG chewable tablet Chew 1 tablet by mouth every 4 (four) hours as needed for indigestion or heartburn.    cyanocobalamin 500 MCG tablet Take 500 mcg by mouth 2 (two) times daily.    ferrous sulfate 325 (65 FE) MG tablet Take 325 mg by mouth daily with breakfast.     fluticasone (FLONASE) 50 MCG/ACT nasal spray Place 2 sprays into both nostrils daily. Refills: 2    furosemide (LASIX) 20 MG tablet Take 2 tablets (40 mg total) by mouth daily. Qty: 30 tablet    gabapentin (NEURONTIN) 300 MG capsule Take 300 mg by mouth 3 (three) times daily.    guaiFENesin-dextromethorphan (ROBITUSSIN DM) 100-10 MG/5ML syrup Take 10 mLs by mouth every 4 (four) hours as needed for cough.    ipratropium-albuterol (DUONEB) 0.5-2.5 (3) MG/3ML SOLN Take 3 mLs by nebulization every 6 (six) hours. Qty: 360 mL    levothyroxine (SYNTHROID, LEVOTHROID) 75 MCG tablet Take 75 mcg by mouth daily before breakfast.    loratadine (CLARITIN) 10 MG tablet Take 10 mg by mouth daily.    nystatin cream (MYCOSTATIN) Apply 1 application topically every 8 (eight) hours as needed (for yeast). Pt applies to all skin folds.    omeprazole (PRILOSEC) 20 MG capsule Take 20 mg by mouth daily.     PARoxetine (PAXIL) 30 MG tablet Take 30 mg by mouth daily.    QUEtiapine (SEROQUEL) 25 MG tablet Take 50 mg by mouth at bedtime.    Skin Protectants, Misc. (CALAZIME SKIN PROTECTANT EX) Apply 1 application topically 4 (four) times daily as needed (for rash). Pt applies to buttocks.    Skin Protectants, Misc. (EUCERIN) cream Apply 1 application topically 2 (two) times daily.     tiotropium (SPIRIVA) 18 MCG inhalation capsule Place 18 mcg into inhaler and inhale daily.    valsartan (DIOVAN) 160 MG tablet Take 160 mg by mouth daily.    Vitamin D, Ergocalciferol, (DRISDOL) 50000 units CAPS capsule Take 50,000 Units by mouth every 30 (thirty) days.      STOP taking these medications     ertapenem (INVANZ) 1 g injection      modafinil (PROVIGIL) 100 MG tablet          DISCHARGE INSTRUCTIONS:    follow-up with primary care physician at the facility in 3 days   follow up with pulmonology-Dr. Stevenson Clinch in a week Follow-up with cardiology in a week Daily weight monitoring. Follow-up with CHF clinic   DIET:  Cardiac diet, diabetic, pro-stat twice a day  DISCHARGE CONDITION:  Fair  ACTIVITY:  Patient is bedbound, reposition every 2 hours  OXYGEN:  Home Oxygen: Yes.  Oxygen Delivery: 3 liters/min via Patient connected to nasal cannula oxygen  DISCHARGE LOCATION:  nursing home   If you experience worsening of your admission symptoms, develop shortness of breath, life threatening emergency, suicidal or homicidal thoughts you must seek medical attention immediately by calling 911 or calling your MD immediately  if symptoms less severe.  You Must read complete instructions/literature along with all the possible adverse reactions/side effects for all the Medicines you take and that have been prescribed to you. Take any new Medicines after you have completely understood and accpet all the possible adverse reactions/side effects.   Please note  You were cared for by a hospitalist during your hospital stay. If you have any questions about your discharge medications or the care you received while you were in the hospital after you are discharged, you can call the unit and asked to speak with the hospitalist on call if the hospitalist that took care of you is not available. Once you are discharged, your primary care physician will handle any further medical  issues. Please note that NO REFILLS for any discharge medications will be authorized once you are discharged, as it is imperative that you return to your primary care physician (or establish a relationship with a primary care physician if you do not have one) for your aftercare needs so that they can reassess your need for medications and monitor your lab values.     Today  Chief Complaint  Patient presents with  . Fatigue  . Shortness of Breath   Patient is feeling fine. Chronically bedbound and has chronic indwelling Foley catheter. No overnight events and wants to go back to nursing home. Step Daughter at bedside.  ROS:  CONSTITUTIONAL: Denies fevers, chills. Denies any fatigue, weakness.  EYES: Denies blurry vision, double vision, eye pain. EARS, NOSE, THROAT: Denies tinnitus, ear pain, hearing loss. RESPIRATORY: Denies cough, wheeze, shortness of breath.  CARDIOVASCULAR: Denies chest pain, palpitations, edema.  GASTROINTESTINAL: Denies nausea, vomiting, diarrhea, abdominal pain. Denies bright red blood per rectum. GENITOURINARY: Denies dysuria, hematuria. ENDOCRINE: Denies nocturia or thyroid problems. HEMATOLOGIC AND LYMPHATIC: Denies easy bruising or bleeding. SKIN: Denies rash or lesion. MUSCULOSKELETAL: Denies pain in neck, back, shoulder, knees, hips or arthritic symptoms.  NEUROLOGIC: Denies paralysis, paresthesias.  PSYCHIATRIC: Denies anxiety or depressive symptoms.   VITAL SIGNS:  Blood pressure 113/71, pulse 106, temperature 98.5 F (36.9 C), temperature source Oral, resp. rate 20, height 5\' 2"  (1.575 m), weight 159.666 kg (352 lb), SpO2 98 %.  I/O:    Intake/Output Summary (Last 24 hours) at 02/11/16 1133 Last data filed at 02/11/16 0803  Gross per 24 hour  Intake     33 ml  Output    950 ml  Net   -917 ml    PHYSICAL EXAMINATION:  GENERAL:  81 y.o.-year-old patient lying in the bed with no acute distress.  morbidly obese EYES: Pupils equal, round,  reactive to light and accommodation. No scleral icterus. Extraocular muscles intact.  HEENT: Head atraumatic, normocephalic. Oropharynx and nasopharynx clear.  NECK:  Supple, no jugular venous distention. No thyroid enlargement, no tenderness.  LUNGS: Normal breath sounds bilaterally, no wheezing, rales,rhonchi or crepitation. No use of accessory muscles of respiration.  CARDIOVASCULAR: S1, S2 normal. No murmurs, rubs, or gallops.  ABDOMEN: Soft, non-tender, non-distended. Bowel sounds present. No organomegaly or mass. Chronic indwelling Foley catheter  EXTREMITIES: No pedal edema, cyanosis, or clubbing.  NEUROLOGIC: Cranial nerves II through XII are intact. Muscle strength 5/5 in all  extremities. Sensation intact. Gait not checked.  PSYCHIATRIC: The patient is alert and oriented x 3.  SKIN: No obvious rash, lesion; chronic lt gluteal  pressure ulcer  DATA REVIEW:   CBC  Recent Labs Lab 02/10/16 0504  WBC 11.3*  HGB 9.8*  HCT 30.0*  PLT 211    Chemistries   Recent Labs Lab 02/11/16 0509  NA 137  K 4.1  CL 110  CO2 25  GLUCOSE 98  BUN 16  CREATININE 0.67  CALCIUM 9.1    Cardiac Enzymes  Recent Labs Lab 02/07/16 1254  TROPONINI <0.03    Microbiology Results  Results for orders placed or performed during the hospital encounter of 02/07/16  Blood Culture (routine x 2)     Status: Abnormal   Collection Time: 02/07/16  4:10 PM  Result Value Ref Range Status   Specimen Description BLOOD RIGHT CHEST  Final   Special Requests BOTTLES DRAWN AEROBIC AND ANAEROBIC  2CC  Final   Culture  Setup Time   Final    GRAM POSITIVE COCCI IN CHAINS ANAEROBIC BOTTLE ONLY CRITICAL RESULT CALLED TO, READ BACK BY AND VERIFIED WITH: SHEEMA HALLAJI 02/08/2016 1148 BY J RAZZAKSUAREZ,MT    Culture (A)  Final    VANCOMYCIN RESISTANT ENTEROCOCCUS ANAEROBIC BOTTLE ONLY VRE HAVE INTRINSIC RESISTANCE TO MOST COMMONLY USED ANTIBIOTICS AND THE ABILITY TO ACQUIRE RESISTANCE TO MOST AVAILABLE  ANTIBIOTICS.    Report Status 02/11/2016 FINAL  Final   Organism ID, Bacteria VANCOMYCIN RESISTANT ENTEROCOCCUS  Final      Susceptibility   Vancomycin resistant enterococcus - MIC*    AMPICILLIN >=32 RESISTANT Resistant     VANCOMYCIN >=32 RESISTANT Resistant     GENTAMICIN SYNERGY SENSITIVE Sensitive     LINEZOLID 2 SENSITIVE Sensitive     * VANCOMYCIN RESISTANT ENTEROCOCCUS  Blood Culture (routine x 2)     Status: None (Preliminary result)   Collection Time: 02/07/16  4:10 PM  Result Value Ref Range Status   Specimen Description BLOOD RIGHT SHOULDER  Final   Special Requests BOTTLES DRAWN AEROBIC AND ANAEROBIC  1CC  Final   Culture NO GROWTH 3 DAYS  Final   Report Status PENDING  Incomplete  Blood Culture ID Panel (Reflexed)     Status: Abnormal   Collection Time: 02/07/16  4:10 PM  Result Value Ref Range Status   Enterococcus species DETECTED (A) NOT DETECTED Final    Comment: CRITICAL RESULT CALLED TO, READ BACK BY AND VERIFIED WITH: SHEEMA HALLAJI 02/08/2016 1148 BY J RAZZAKSUAREZ,MT    Vancomycin resistance DETECTED (A) NOT DETECTED Final    Comment: CRITICAL RESULT CALLED TO, READ BACK BY AND VERIFIED WITH: SHEEMA HALLAJI 02/08/2016 1148 BY J RAZZAKSUAREZ,MT    Listeria monocytogenes NOT DETECTED NOT DETECTED Final   Staphylococcus species NOT DETECTED NOT DETECTED Final   Staphylococcus aureus NOT DETECTED NOT DETECTED Final   Methicillin resistance NOT DETECTED NOT DETECTED Final   Streptococcus species NOT DETECTED NOT DETECTED Final   Streptococcus agalactiae NOT DETECTED NOT DETECTED Final   Streptococcus pneumoniae NOT DETECTED NOT DETECTED Final   Streptococcus pyogenes NOT DETECTED NOT DETECTED Final   Acinetobacter baumannii NOT DETECTED NOT DETECTED Final   Enterobacteriaceae species NOT DETECTED NOT DETECTED Final   Enterobacter cloacae complex NOT DETECTED NOT DETECTED Final   Escherichia coli NOT DETECTED NOT DETECTED Final   Klebsiella oxytoca NOT DETECTED  NOT DETECTED Final   Klebsiella pneumoniae NOT DETECTED NOT DETECTED Final   Proteus species NOT DETECTED NOT  DETECTED Final   Serratia marcescens NOT DETECTED NOT DETECTED Final   Carbapenem resistance NOT DETECTED NOT DETECTED Final   Haemophilus influenzae NOT DETECTED NOT DETECTED Final   Neisseria meningitidis NOT DETECTED NOT DETECTED Final   Pseudomonas aeruginosa NOT DETECTED NOT DETECTED Final   Candida albicans NOT DETECTED NOT DETECTED Final   Candida glabrata NOT DETECTED NOT DETECTED Final   Candida krusei NOT DETECTED NOT DETECTED Final   Candida parapsilosis NOT DETECTED NOT DETECTED Final   Candida tropicalis NOT DETECTED NOT DETECTED Final  Urine culture     Status: None   Collection Time: 02/08/16  9:27 AM  Result Value Ref Range Status   Specimen Description URINE, RANDOM  Final   Special Requests NONE  Final   Culture NO GROWTH 1 DAY  Final   Report Status 02/10/2016 FINAL  Final    RADIOLOGY:  Dg Chest 1 View  02/07/2016  CLINICAL DATA:  Weakness and shortness of breath EXAM: CHEST 1 VIEW COMPARISON:  January 13, 2016 FINDINGS: No pneumothorax. Low lung volumes limit evaluation. Cardiomegaly stable. The hila and mediastinum are unchanged. No pulmonary nodules or masses. Mild vascular congestion without overt edema. No focal infiltrate identified. IMPRESSION: Mild vascular congestion without overt edema. No other acute abnormalities. Electronically Signed   By: Dorise Bullion III M.D   On: 02/07/2016 13:59   Dg Chest Port 1 View  02/09/2016  CLINICAL DATA:  Followup bibasilar atelectasis. Patient admitted 02/07/2016 for sepsis. EXAM: PORTABLE CHEST 1 VIEW COMPARISON:  02/07/2016 and earlier. FINDINGS: Markedly suboptimal inspiration. Airspace consolidation with air bronchograms in the medial left lower lobe. Lungs otherwise clear. Pulmonary vascularity normal. No visible pleural effusions. Cardiac silhouette moderately enlarged, unchanged. IMPRESSION: Markedly suboptimal  inspiration. Developing medial left lower lobe atelectasis and/or pneumonia. Electronically Signed   By: Evangeline Dakin M.D.   On: 02/09/2016 08:11    EKG:   Orders placed or performed during the hospital encounter of 02/07/16  . ED EKG  . ED EKG  . EKG 12-Lead  . EKG 12-Lead      Management plans discussed with the patient, family and they are in agreement.  CODE STATUS:     Code Status Orders        Start     Ordered   02/07/16 1629  Do not attempt resuscitation (DNR)   Continuous    Question Answer Comment  In the event of cardiac or respiratory ARREST Do not call a "code blue"   In the event of cardiac or respiratory ARREST Do not perform Intubation, CPR, defibrillation or ACLS   In the event of cardiac or respiratory ARREST Use medication by any route, position, wound care, and other measures to relive pain and suffering. May use oxygen, suction and manual treatment of airway obstruction as needed for comfort.      02/07/16 1630    Code Status History    Date Active Date Inactive Code Status Order ID Comments User Context   01/07/2016 10:25 PM 01/13/2016  7:22 PM Full Code [id number]  Theodoro Grist, MD ED    Advance Directive Documentation        Most Recent Value   Type of Advance Directive  Out of facility DNR (pink MOST or yellow form)   Pre-existing out of facility DNR order (yellow form or pink MOST form)     "MOST" Form in Place?        TOTAL TIME TAKING CARE OF THIS PATIENT: 45 minutes.    @  M[facility]@  on 02/11/2016 at 11:33 AM  Between 7am to 6pm - Pager - [phone]  After 6pm go to [email or web address] - password [email or web address] [facility]  [facility]  Office  [phone]  CC: Primary care physician; Margarita Rana, MD

## 2016-02-11 NOTE — NC FL2 (Signed)
[place] LEVEL OF CARE SCREENING TOOL     IDENTIFICATION  Patient Name: Kathy Guzman Birthdate: 04-09-1935 Sex: female Admission Date (Current Location): 02/07/2016  [place] and [place] Number:  Engineering geologist and Address:  [facility], [address] [place], [place] [address]      Provider Number: [id number]  Attending Physician Name and Address:  Nicholes Mango, MD  Relative Name and Phone Number:       Current Level of Care: Hospital Recommended Level of Care: [place] Prior Approval Number:    Date Approved/Denied:   PASRR Number: [id number]  Discharge Plan: SNF    Current Diagnoses: Patient Active Problem List   Diagnosis Date Noted  . Pressure ulcer 02/08/2016  . Sepsis ([place]) 02/07/2016  . Acute encephalopathy 01/10/2016  . Delirium 01/07/2016  . Acute cystitis without hematuria 01/07/2016  . Hyperkalemia 01/07/2016  . Hyponatremia 01/07/2016  . Anemia 01/07/2016  . Morbid obesity ([place]) 01/07/2016  . Body aches 01/07/2016  . Chronic diastolic heart failure ([place]) 03/19/2015  . COPD (chronic obstructive pulmonary disease) ([place]) 03/19/2015    Orientation RESPIRATION BLADDER Height & Weight     Self, Time, Situation, Place  O2 (3L) Continent Weight: (!) 352 lb (159.666 kg) Height:  5\' 2"  (157.5 cm)  BEHAVIORAL SYMPTOMS/MOOD NEUROLOGICAL BOWEL NUTRITION STATUS      Incontinent Diet (Soft Diet)  AMBULATORY STATUS COMMUNICATION OF NEEDS Skin   Extensive Assist Verbally Normal                       Personal Care Assistance Level of Assistance  Bathing, Feeding, Dressing Bathing Assistance: Maximum assistance Feeding assistance: Maximum assistance Dressing Assistance: Maximum assistance Total Care Assistance: Maximum assistance   Functional Limitations Info  Sight, Hearing, Speech Sight Info: Adequate Hearing Info: Adequate Speech Info: Adequate    SPECIAL CARE FACTORS FREQUENCY   PT (By licensed PT)     PT Frequency: 5x week              Contractures Contractures Info: Not present    Additional Factors Info  Code Status, Allergies Code Status Info: DNR Allergies Info: Nsaids, Tramadol           Current Medications (02/11/2016):  This is the current hospital active medication list Current Facility-Administered Medications  Medication Dose Route Frequency Provider Last Rate Last Dose  . antiseptic oral rinse (CPC / CETYLPYRIDINIUM CHLORIDE 0.05%) solution 7 mL  7 mL Mouth Rinse BID Hillary Bow, MD   7 mL at 02/11/16 0919  . bisacodyl (DULCOLAX) EC tablet 5 mg  5 mg Oral Daily PRN Srikar Sudini, MD      . DAPTOmycin (CUBICIN) 750 mg in sodium chloride 0.9 % IVPB  750 mg Intravenous Q24H Charlett Nose, RPH   750 mg at 02/10/16 1854  . docusate sodium (COLACE) capsule 100 mg  100 mg Oral BID Hillary Bow, MD   100 mg at 02/11/16 0918  . enoxaparin (LOVENOX) injection 40 mg  40 mg Subcutaneous Q12H Hillary Bow, MD   40 mg at 02/11/16 0918  . feeding supplement (PRO-STAT SUGAR FREE 64) liquid 30 mL  30 mL Oral BID Nicholes Mango, MD   30 mL at 02/11/16 1000  . guaiFENesin-dextromethorphan (ROBITUSSIN DM) 100-10 MG/5ML syrup 5 mL  5 mL Oral Q6H PRN Lance Coon, MD   5 mL at 02/10/16 0151  . ipratropium-albuterol (DUONEB) 0.5-2.5 (3) MG/3ML nebulizer solution 3 mL  3 mL Nebulization Q4H PRN Srikar Sudini, MD      . morphine 2 MG/ML injection 2 mg  2 mg Intravenous Q4H PRN Srikar Sudini, MD      . ondansetron (ZOFRAN) tablet 4 mg  4 mg Oral Q6H PRN Hillary Bow, MD       Or  . ondansetron (ZOFRAN) injection 4 mg  4 mg Intravenous Q6H PRN Srikar Sudini, MD      . polyethylene glycol (MIRALAX / GLYCOLAX) packet 17 g  17 g Oral Daily PRN Srikar Sudini, MD      . sodium chloride flush (NS) 0.9 % injection 3 mL  3 mL Intravenous Q12H Hillary Bow, MD   3 mL at 02/11/16 [id number]     Discharge Medications: Please see discharge summary for a list of discharge  medications.  Relevant Imaging Results:  Relevant Lab Results:   Additional Information SSN;  [id number]  Darden Dates, LCSW

## 2016-02-13 LAB — CULTURE, BLOOD (ROUTINE X 2): CULTURE: NO GROWTH

## 2016-02-24 ENCOUNTER — Encounter (INDEPENDENT_AMBULATORY_CARE_PROVIDER_SITE_OTHER): Payer: Self-pay

## 2016-02-24 ENCOUNTER — Ambulatory Visit (INDEPENDENT_AMBULATORY_CARE_PROVIDER_SITE_OTHER): Payer: Medicare Other | Admitting: Internal Medicine

## 2016-02-24 ENCOUNTER — Encounter: Payer: Self-pay | Admitting: Internal Medicine

## 2016-02-24 VITALS — BP 138/78 | HR 98 | Ht 62.0 in | Wt 345.0 lb

## 2016-02-24 DIAGNOSIS — J410 Simple chronic bronchitis: Secondary | ICD-10-CM

## 2016-02-24 DIAGNOSIS — J9610 Chronic respiratory failure, unspecified whether with hypoxia or hypercapnia: Secondary | ICD-10-CM | POA: Insufficient documentation

## 2016-02-24 DIAGNOSIS — J9611 Chronic respiratory failure with hypoxia: Secondary | ICD-10-CM

## 2016-02-24 NOTE — Patient Instructions (Addendum)
Follow up with Dr. Stevenson Clinch in:3 months -Continue with Spiriva, Symbicort, scheduled duonebs nebulizers 4 times per day -Incentive spirometry 10-15 times per day -Continue with supplemental oxygen 2 L continuously -Avoid sick contacts -Office Spirometry at next visit

## 2016-02-24 NOTE — Progress Notes (Signed)
[facility] Consultation    Date: 02/24/2016  MRN# [id number] Kathy Guzman [birth year]  Referring Physician: Dr. Minda Meo Kathy Guzman is a 81 y.o. old female seen in consultation for hospital follow up   CC:  Chief Complaint  Patient presents with  . Hospitalization Follow-up    pt states breathing is up & down since hosp. c/o SOB, dry cough occ prod white in color, wheezing, cp/tightness    HPI:  81 year old female seen in consultation as a hospital follow-up from recent VRE bacteremia and mild COPD exacerbation. Briefly patient was admitted with suspected UTI found to have VRE bacteremia and 1 out of 2 bottles. She was treated empirically with daptomycin, and transition to Zyvox. Since discharge she's been doing fairly well from a infectious disease standpoint In the hospital she was noted to have CO2 retention, and required BiPAP at night only during the inpatient setting. Since discharge from the hospital patient has been doing fairly well from a respiratory standpoint, she does have intermittent episodes of shortness of breath and chest tightness for which she is on scheduled albuterol nebulizers 4 times a day. I have reviewed her medications today which include Symbicort, Spiriva, Flonase, and scheduled DuoNeb's. 2 daughters are present during visit today. They inquired about BiPAP at night, however, this is not needed at this time given that she's not showing signs and symptoms of CO2 retention. In addition, she will need another sleep study to determine BiPAP settings and requirements. Currently at this time she cannot go for a sleep study nor does she need one. Today, she is noted to have some mild vomiting during office visit, for which she attributes to taking her a.m. medications on an empty stomach. Overall for pulmonary standpoint today, patient is almost back to baseline breathing, no significant sputum production noted, but does admit to a mild  cough.  [facility] hospitalization reviewed by Dr. Stevenson Clinch Severe sepsis likely from catheter related urinary tract infection/we are you sepsis  acute encephalopathy resolved Patient has chronic Foley catheter, that has been changed in the ED yesterday at the time of admission. Urinalysis with 1+ leukoesterase, urine culture with no growth in one day  blood culture 1 bottle is with VRE sensitive to daptomycin, approved by [facility], discussed with [facility] through the weekend as pharmacy needs approval from ID. Dr. Ola Spurr recommended to switch the patient to by mouth Zyvox to complete a total antibiotic course of 10 days for VRE Discontinued vancomycin and Zosyn IV fluids given Clinically improved    Acute renal failure with hyponatremia -resolved Given IV fluids normal saline at 75 mg/h. Will discontinue as the patient is eating well TSH is normal. Triglycerides at 169 and total cholesterol 211, LDL at 112 appreciate nephrology recommendations   Acute on chronic respiratory failure, hypercarbic Off BiPAP. Clinically improving. Appreciate pulmonology recommendations Will continue BIPAP daily at bedtime for possible underlying obstructive sleep apnea  Chronic diastolic CHF No overt edema on the chest x-ray. She does seem to have chronic anasarca.  Morbid obesity Likely has sleep apnea. Will need BIPAP daily at bedtime. Outpatient follow-up with pulmonary.   DVT prophylaxis with Lovenox  PT is recommending no PT needs, patient is mostly bedbound PMHX:   Past Medical History  Diagnosis Date  . CHF (congestive heart failure) ([place])   . Hypertension   . Hyperlipidemia   . COPD (chronic obstructive pulmonary disease) ([place])   . GERD (gastroesophageal reflux disease)   . Hypothyroid   .  Osteoarthritis   . Depression   . Anxiety   . Skin cancer   . Urethral stricture   . Gout   . Recurrent UTI    Surgical Hx:  Past Surgical History  Procedure Laterality  Date  . Partial hysterectomy    . Cholecystectomy     Family Hx:  Family History  Problem Relation Age of Onset  . Colon cancer Brother   . Heart attack Father    Social Hx:   Social History  Substance Use Topics  . Smoking status: Former Smoker -- 0.25 packs/day    Quit date: 03/18/1994  . Smokeless tobacco: Never Used  . Alcohol Use: No   Medication:   Current Outpatient Rx  Name  Route  Sig  Dispense  Refill  . acetaminophen (TYLENOL) 500 MG tablet   Oral   Take 500 mg by mouth every 8 (eight) hours as needed for mild pain.          Marland Kitchen albuterol (PROVENTIL HFA;VENTOLIN HFA) 108 (90 BASE) MCG/ACT inhaler   Inhalation   Inhale 2 puffs into the lungs every 4 (four) hours as needed for wheezing or shortness of breath.         . allopurinol (ZYLOPRIM) 100 MG tablet   Oral   Take 100 mg by mouth daily.          . Amino Acids-Protein Hydrolys (FEEDING SUPPLEMENT, PRO-STAT SUGAR FREE 64,) LIQD   Oral   Take 30 mLs by mouth 2 (two) times daily.   900 mL   0   . bisacodyl (DULCOLAX) 5 MG EC tablet   Oral   Take 1 tablet (5 mg total) by mouth daily as needed for moderate constipation.   30 tablet   0   . budesonide-formoterol (SYMBICORT) 160-4.5 MCG/ACT inhaler   Inhalation   Inhale 2 puffs into the lungs 2 (two) times daily.         . calcium carbonate (TUMS) 500 MG chewable tablet   Oral   Chew 1 tablet by mouth every 4 (four) hours as needed for indigestion or heartburn.         . clonazePAM (KLONOPIN) 0.5 MG tablet   Oral   Take 1 tablet (0.5 mg total) by mouth at bedtime.   20 tablet   0   . cyanocobalamin 500 MCG tablet   Oral   Take 500 mcg by mouth 2 (two) times daily.         Marland Kitchen docusate sodium (COLACE) 100 MG capsule   Oral   Take 1 capsule (100 mg total) by mouth 2 (two) times daily.   10 capsule   0   . ferrous sulfate 325 (65 FE) MG tablet   Oral   Take 325 mg by mouth daily with breakfast.          . fluticasone (FLONASE) 50  MCG/ACT nasal spray   Each Nare   Place 2 sprays into both nostrils daily.      2   . furosemide (LASIX) 20 MG tablet   Oral   Take 2 tablets (40 mg total) by mouth daily.   30 tablet      . gabapentin (NEURONTIN) 300 MG capsule   Oral   Take 300 mg by mouth 3 (three) times daily.         Marland Kitchen guaiFENesin-dextromethorphan (ROBITUSSIN DM) 100-10 MG/5ML syrup   Oral   Take 10 mLs by mouth every 4 (four) hours as needed for  cough.         . ipratropium-albuterol (DUONEB) 0.5-2.5 (3) MG/3ML SOLN   Nebulization   Take 3 mLs by nebulization every 6 (six) hours.   360 mL      . levothyroxine (SYNTHROID, LEVOTHROID) 75 MCG tablet   Oral   Take 75 mcg by mouth daily before breakfast.         . linezolid (ZYVOX) 600 MG tablet   Oral   Take 1 tablet (600 mg total) by mouth 2 (two) times daily.   14 tablet   0   . loratadine (CLARITIN) 10 MG tablet   Oral   Take 10 mg by mouth daily.         Marland Kitchen nystatin cream (MYCOSTATIN)   Topical   Apply 1 application topically every 8 (eight) hours as needed (for yeast). Pt applies to all skin folds.         Marland Kitchen omeprazole (PRILOSEC) 20 MG capsule   Oral   Take 20 mg by mouth daily.          Marland Kitchen PARoxetine (PAXIL) 30 MG tablet   Oral   Take 30 mg by mouth daily.         . QUEtiapine (SEROQUEL) 25 MG tablet   Oral   Take 50 mg by mouth at bedtime.         . Skin Protectants, Misc. (CALAZIME SKIN PROTECTANT EX)   Apply externally   Apply 1 application topically 4 (four) times daily as needed (for rash). Pt applies to buttocks.         . Skin Protectants, Misc. (EUCERIN) cream   Topical   Apply 1 application topically 2 (two) times daily.         Marland Kitchen tiotropium (SPIRIVA) 18 MCG inhalation capsule   Inhalation   Place 18 mcg into inhaler and inhale daily.         . valsartan (DIOVAN) 160 MG tablet   Oral   Take 160 mg by mouth daily.         . Vitamin D, Ergocalciferol, (DRISDOL) 50000 units CAPS capsule    Oral   Take 50,000 Units by mouth every 30 (thirty) days.             Allergies:  Nsaids and Tramadol  Review of Systems  Constitutional: Negative for fever and chills.  Eyes: Negative for blurred vision and double vision.  Respiratory: Positive for cough, shortness of breath and wheezing.   Cardiovascular: Negative for chest pain.  Gastrointestinal: Positive for nausea, vomiting and abdominal pain. Negative for heartburn, diarrhea and constipation.  Genitourinary: Negative for dysuria.  Skin: Negative for itching and rash.  Neurological: Positive for weakness. Negative for dizziness and headaches.  Endo/Heme/Allergies: Bruises/bleeds easily.  Psychiatric/Behavioral: Negative for depression.     Physical Examination:   VS: BP 138/78 mmHg  Pulse 98  Ht 5\' 2"  (1.575 m)  Wt 345 lb (156.491 kg)  BMI 63.09 kg/m2  SpO2 99%  Physical Exam  Constitutional: She is well-developed, well-nourished, and in no distress.  HENT:  Head: Normocephalic and atraumatic.  Right Ear: External ear normal.  Left Ear: External ear normal.  Nose: Nose normal.  Mouth/Throat: Oropharynx is clear and moist.  Eyes: Conjunctivae and EOM are normal. Pupils are equal, round, and reactive to light.  Neck: Normal range of motion. Neck supple.  Cardiovascular: Normal rate, regular rhythm, normal heart sounds and intact distal pulses.  Pulmonary/Chest: Effort normal. She has no rales.  Shallow breath sounds at the bases, mild exp wheezes Abdominal: Soft. Bowel sounds are normal.  Musculoskeletal: Normal range of motion. She exhibits edema.  Neurological: She is alert.  Skin: Skin is warm and dry.  Nursing note and vitals reviewed.    Rad results: (The following images and results were reviewed by Dr. Stevenson Clinch on 02/24/2016). CXR 02/09/16 FINDINGS: Markedly suboptimal inspiration. Airspace consolidation with air bronchograms in the medial left lower lobe. Lungs otherwise clear. Pulmonary  vascularity normal. No visible pleural effusions. Cardiac silhouette moderately enlarged, unchanged.  IMPRESSION: Markedly suboptimal inspiration. Developing medial left lower lobe atelectasis and/or pneumonia.   Assessment and Plan: 81 year old bedridden female with past medical history of COPD, chronic respiratory failure on 2 L of oxygen, congestive heart failure, morbid obesity, recent hospitalization for VRE bacteremia,hypoxia, acute on chronic respiratory failure seen for COPD follow up  COPD (chronic obstructive pulmonary disease) COPD by history and symptoms Patient is severely deconditioned with multiple medical issues including morbid obesity and congestive heart failure that does not preclude her to a full pulmonary function test. Will plan for spirometry follow-up visit. Currently, she is NOT requiring any BiPAP therapy given the fact that she's not exhibiting signs of carbon dioxide retention. She has been adequately treated for her bacteremia, VRE.  Plan: -Continue with Symbicort, Spiriva, scheduled duo-nebs QID -Gargle and rinse after each inhaler use -Continue with 2 L supplemental oxygen continuously -Maintain O2 saturations >88% -Patient with complex medical history, essentially bedridden, multiple medical issues, I have introduced hospice with the family, but they are not ready for this just yet. She is a DO NOT RESUSCITATE - office spirometer prior to follow up.   Chronic respiratory failure ([place]) Continue with 2 L supplemental oxygen continuously    Updated Medication List Outpatient Encounter Prescriptions as of 02/24/2016  Medication Sig  . acetaminophen (TYLENOL) 500 MG tablet Take 500 mg by mouth every 8 (eight) hours as needed for mild pain.   Marland Kitchen albuterol (PROVENTIL HFA;VENTOLIN HFA) 108 (90 BASE) MCG/ACT inhaler Inhale 2 puffs into the lungs every 4 (four) hours as needed for wheezing or shortness of breath.  . allopurinol (ZYLOPRIM) 100 MG tablet Take  100 mg by mouth daily.   . Amino Acids-Protein Hydrolys (FEEDING SUPPLEMENT, PRO-STAT SUGAR FREE 64,) LIQD Take 30 mLs by mouth 2 (two) times daily.  . bisacodyl (DULCOLAX) 5 MG EC tablet Take 1 tablet (5 mg total) by mouth daily as needed for moderate constipation.  . budesonide-formoterol (SYMBICORT) 160-4.5 MCG/ACT inhaler Inhale 2 puffs into the lungs 2 (two) times daily.  . calcium carbonate (TUMS) 500 MG chewable tablet Chew 1 tablet by mouth every 4 (four) hours as needed for indigestion or heartburn.  . clonazePAM (KLONOPIN) 0.5 MG tablet Take 1 tablet (0.5 mg total) by mouth at bedtime.  . cyanocobalamin 500 MCG tablet Take 500 mcg by mouth 2 (two) times daily.  Marland Kitchen docusate sodium (COLACE) 100 MG capsule Take 1 capsule (100 mg total) by mouth 2 (two) times daily.  . ferrous sulfate 325 (65 FE) MG tablet Take 325 mg by mouth daily with breakfast.   . fluticasone (FLONASE) 50 MCG/ACT nasal spray Place 2 sprays into both nostrils daily.  . furosemide (LASIX) 20 MG tablet Take 2 tablets (40 mg total) by mouth daily.  Marland Kitchen gabapentin (NEURONTIN) 300 MG capsule Take 300 mg by mouth 3 (three) times daily.  Marland Kitchen guaiFENesin-dextromethorphan (ROBITUSSIN DM) 100-10 MG/5ML syrup Take 10 mLs by mouth every 4 (four) hours as needed for  cough.  . ipratropium-albuterol (DUONEB) 0.5-2.5 (3) MG/3ML SOLN Take 3 mLs by nebulization every 6 (six) hours.  Marland Kitchen levothyroxine (SYNTHROID, LEVOTHROID) 75 MCG tablet Take 75 mcg by mouth daily before breakfast.  . linezolid (ZYVOX) 600 MG tablet Take 1 tablet (600 mg total) by mouth 2 (two) times daily.  Marland Kitchen loratadine (CLARITIN) 10 MG tablet Take 10 mg by mouth daily.  Marland Kitchen nystatin cream (MYCOSTATIN) Apply 1 application topically every 8 (eight) hours as needed (for yeast). Pt applies to all skin folds.  Marland Kitchen omeprazole (PRILOSEC) 20 MG capsule Take 20 mg by mouth daily.   Marland Kitchen PARoxetine (PAXIL) 30 MG tablet Take 30 mg by mouth daily.  . QUEtiapine (SEROQUEL) 25 MG tablet Take 50  mg by mouth at bedtime.  . Skin Protectants, Misc. (CALAZIME SKIN PROTECTANT EX) Apply 1 application topically 4 (four) times daily as needed (for rash). Pt applies to buttocks.  . Skin Protectants, Misc. (EUCERIN) cream Apply 1 application topically 2 (two) times daily.  Marland Kitchen tiotropium (SPIRIVA) 18 MCG inhalation capsule Place 18 mcg into inhaler and inhale daily.  . valsartan (DIOVAN) 160 MG tablet Take 160 mg by mouth daily.  . Vitamin D, Ergocalciferol, (DRISDOL) 50000 units CAPS capsule Take 50,000 Units by mouth every 30 (thirty) days.   No facility-administered encounter medications on file as of 02/24/2016.    Orders for this visit: No orders of the defined types were placed in this encounter.     Thank  you for the consultation and for allowing [facility] to assist in the care of your patient. Our recommendations are noted above.  Please contact us if we can be of further service.   Vilinda Boehringer, MD [facility] Office Number: [phone]  Note: This note was prepared with Dragon dictation along with smaller phrase technology. Any transcriptional errors that result from this process are unintentional.

## 2016-02-24 NOTE — Assessment & Plan Note (Addendum)
COPD by history and symptoms Patient is severely deconditioned with multiple medical issues including morbid obesity and congestive heart failure that does not preclude her to a full pulmonary function test. Will plan for spirometry follow-up visit. Currently, she is NOT requiring any BiPAP therapy given the fact that she's not exhibiting signs of carbon dioxide retention. She has been adequately treated for her bacteremia, VRE.  Plan: -Continue with Symbicort, Spiriva, scheduled duo-nebs QID -Gargle and rinse after each inhaler use -Continue with 2 L supplemental oxygen continuously -Maintain O2 saturations >88% -Patient with complex medical history, essentially bedridden, multiple medical issues, I have introduced hospice with the family, but they are not ready for this just yet. She is a DO NOT RESUSCITATE - office spirometer prior to follow up.

## 2016-02-24 NOTE — Assessment & Plan Note (Signed)
Continue with 2 L supplemental oxygen continuously

## 2016-03-24 ENCOUNTER — Inpatient Hospital Stay: Payer: Medicare Other | Admitting: Hematology and Oncology

## 2016-04-17 ENCOUNTER — Inpatient Hospital Stay: Payer: Medicare Other | Admitting: Hematology and Oncology

## 2016-07-01 IMAGING — DX DG CHEST 1V
1 series · 1 of 1 positions shown · non-contrast
Comparison: January 13, 2016

CLINICAL DATA: Weakness and shortness of breath

EXAM:
CHEST 1 VIEW

[chest ap]
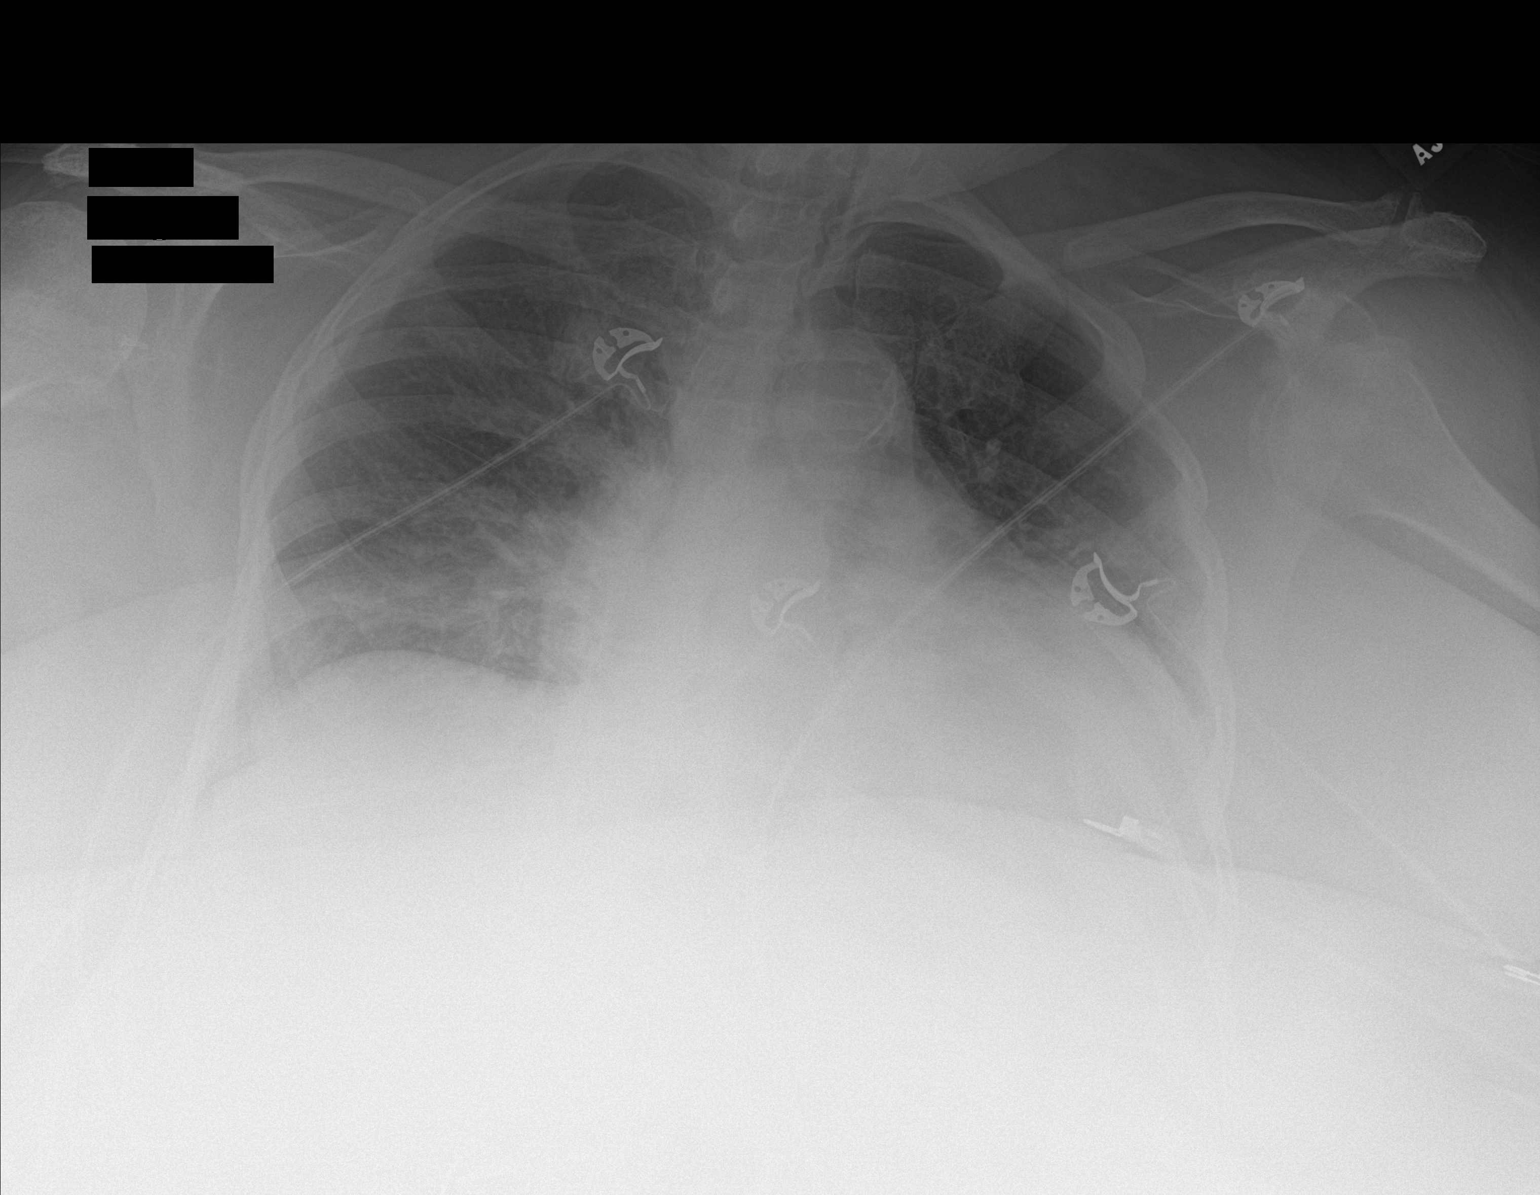

[1 of 1 positions shown; findings below may reference images not displayed]

FINDINGS: No pneumothorax. Low lung volumes limit evaluation. Cardiomegaly
stable. The hila and mediastinum are unchanged. No pulmonary nodules
or masses. Mild vascular congestion without overt edema. No focal
infiltrate identified.
IMPRESSION: Mild vascular congestion without overt edema. No other acute
abnormalities.

## 2017-05-06 ENCOUNTER — Inpatient Hospital Stay
Admission: EM | Admit: 2017-05-06 | Discharge: 2017-05-16 | DRG: 871 | Disposition: A | Discharge status: Skilled Nursing Facility | Payer: Medicare Other | Attending: Internal Medicine | Admitting: Internal Medicine

## 2017-05-06 ENCOUNTER — Emergency Department: Payer: Medicare Other

## 2017-05-06 ENCOUNTER — Encounter: Payer: Self-pay | Admitting: *Deleted

## 2017-05-06 DIAGNOSIS — F411 Generalized anxiety disorder: Secondary | ICD-10-CM | POA: Diagnosis present

## 2017-05-06 DIAGNOSIS — R0602 Shortness of breath: Secondary | ICD-10-CM | POA: Diagnosis not present

## 2017-05-06 DIAGNOSIS — I5033 Acute on chronic diastolic (congestive) heart failure: Secondary | ICD-10-CM | POA: Diagnosis present

## 2017-05-06 DIAGNOSIS — G253 Myoclonus: Secondary | ICD-10-CM | POA: Diagnosis present

## 2017-05-06 DIAGNOSIS — Z79899 Other long term (current) drug therapy: Secondary | ICD-10-CM | POA: Diagnosis not present

## 2017-05-06 DIAGNOSIS — Z885 Allergy status to narcotic agent status: Secondary | ICD-10-CM | POA: Diagnosis not present

## 2017-05-06 DIAGNOSIS — Z8249 Family history of ischemic heart disease and other diseases of the circulatory system: Secondary | ICD-10-CM | POA: Diagnosis not present

## 2017-05-06 DIAGNOSIS — G9341 Metabolic encephalopathy: Secondary | ICD-10-CM | POA: Diagnosis present

## 2017-05-06 DIAGNOSIS — E662 Morbid (severe) obesity with alveolar hypoventilation: Secondary | ICD-10-CM | POA: Diagnosis present

## 2017-05-06 DIAGNOSIS — E162 Hypoglycemia, unspecified: Secondary | ICD-10-CM | POA: Diagnosis present

## 2017-05-06 DIAGNOSIS — J9621 Acute and chronic respiratory failure with hypoxia: Secondary | ICD-10-CM | POA: Diagnosis present

## 2017-05-06 DIAGNOSIS — J441 Chronic obstructive pulmonary disease with (acute) exacerbation: Secondary | ICD-10-CM | POA: Diagnosis present

## 2017-05-06 DIAGNOSIS — A419 Sepsis, unspecified organism: Secondary | ICD-10-CM | POA: Diagnosis present

## 2017-05-06 DIAGNOSIS — Z6841 Body Mass Index (BMI) 40.0 and over, adult: Secondary | ICD-10-CM

## 2017-05-06 DIAGNOSIS — E872 Acidosis: Secondary | ICD-10-CM | POA: Diagnosis present

## 2017-05-06 DIAGNOSIS — R4182 Altered mental status, unspecified: Secondary | ICD-10-CM | POA: Diagnosis present

## 2017-05-06 DIAGNOSIS — Z87891 Personal history of nicotine dependence: Secondary | ICD-10-CM | POA: Diagnosis not present

## 2017-05-06 DIAGNOSIS — R410 Disorientation, unspecified: Secondary | ICD-10-CM

## 2017-05-06 DIAGNOSIS — Z7401 Bed confinement status: Secondary | ICD-10-CM

## 2017-05-06 DIAGNOSIS — I110 Hypertensive heart disease with heart failure: Secondary | ICD-10-CM | POA: Diagnosis present

## 2017-05-06 DIAGNOSIS — M109 Gout, unspecified: Secondary | ICD-10-CM | POA: Diagnosis present

## 2017-05-06 DIAGNOSIS — Z85828 Personal history of other malignant neoplasm of skin: Secondary | ICD-10-CM | POA: Diagnosis not present

## 2017-05-06 DIAGNOSIS — E782 Mixed hyperlipidemia: Secondary | ICD-10-CM | POA: Diagnosis present

## 2017-05-06 DIAGNOSIS — E039 Hypothyroidism, unspecified: Secondary | ICD-10-CM | POA: Diagnosis present

## 2017-05-06 DIAGNOSIS — D72829 Elevated white blood cell count, unspecified: Secondary | ICD-10-CM | POA: Diagnosis not present

## 2017-05-06 DIAGNOSIS — N390 Urinary tract infection, site not specified: Secondary | ICD-10-CM | POA: Diagnosis present

## 2017-05-06 DIAGNOSIS — Z90710 Acquired absence of both cervix and uterus: Secondary | ICD-10-CM | POA: Diagnosis not present

## 2017-05-06 DIAGNOSIS — Z886 Allergy status to analgesic agent status: Secondary | ICD-10-CM

## 2017-05-06 DIAGNOSIS — Z66 Do not resuscitate: Secondary | ICD-10-CM | POA: Diagnosis present

## 2017-05-06 DIAGNOSIS — J9601 Acute respiratory failure with hypoxia: Secondary | ICD-10-CM | POA: Diagnosis present

## 2017-05-06 DIAGNOSIS — A0472 Enterocolitis due to Clostridium difficile, not specified as recurrent: Secondary | ICD-10-CM | POA: Diagnosis present

## 2017-05-06 DIAGNOSIS — Z7951 Long term (current) use of inhaled steroids: Secondary | ICD-10-CM | POA: Diagnosis not present

## 2017-05-06 DIAGNOSIS — J440 Chronic obstructive pulmonary disease with acute lower respiratory infection: Secondary | ICD-10-CM | POA: Diagnosis present

## 2017-05-06 DIAGNOSIS — I4891 Unspecified atrial fibrillation: Secondary | ICD-10-CM | POA: Diagnosis present

## 2017-05-06 DIAGNOSIS — Z9981 Dependence on supplemental oxygen: Secondary | ICD-10-CM | POA: Diagnosis not present

## 2017-05-06 DIAGNOSIS — J9622 Acute and chronic respiratory failure with hypercapnia: Secondary | ICD-10-CM

## 2017-05-06 DIAGNOSIS — J189 Pneumonia, unspecified organism: Secondary | ICD-10-CM | POA: Diagnosis present

## 2017-05-06 DIAGNOSIS — T380X5A Adverse effect of glucocorticoids and synthetic analogues, initial encounter: Secondary | ICD-10-CM | POA: Diagnosis not present

## 2017-05-06 DIAGNOSIS — J9602 Acute respiratory failure with hypercapnia: Secondary | ICD-10-CM

## 2017-05-06 DIAGNOSIS — D649 Anemia, unspecified: Secondary | ICD-10-CM | POA: Diagnosis present

## 2017-05-06 DIAGNOSIS — K219 Gastro-esophageal reflux disease without esophagitis: Secondary | ICD-10-CM | POA: Diagnosis present

## 2017-05-06 LAB — BLOOD GAS, ARTERIAL
ACID-BASE EXCESS: 8.1 mmol/L — AB (ref 0.0–2.0)
Acid-Base Excess: 9.1 mmol/L — ABNORMAL HIGH (ref 0.0–2.0)
Bicarbonate: 38.6 mmol/L — ABNORMAL HIGH (ref 20.0–28.0)
Bicarbonate: 37.7 mmol/L — ABNORMAL HIGH (ref 20.0–28.0)
DELIVERY SYSTEMS: POSITIVE
Expiratory PAP: 6
FIO2: 0.28
FIO2: 0.4
Inspiratory PAP: 12
O2 SAT: 94 %
O2 Saturation: 96.6 %
PATIENT TEMPERATURE: 37
PATIENT TEMPERATURE: 37
PCO2 ART: 84 mmHg — AB (ref 32.0–48.0)
PH ART: 7.26 — AB (ref 7.350–7.450)
PO2 ART: 80 mmHg — AB (ref 83.0–108.0)
pCO2 arterial: 84 mmHg (ref 32.0–48.0)
pH, Arterial: 7.27 — ABNORMAL LOW (ref 7.350–7.450)
pO2, Arterial: 98 mmHg (ref 83.0–108.0)

## 2017-05-06 LAB — COMPREHENSIVE METABOLIC PANEL
ALBUMIN: 3.2 g/dL — AB (ref 3.5–5.0)
ALK PHOS: 105 U/L (ref 38–126)
ALT: 14 U/L (ref 14–54)
ANION GAP: 5 (ref 5–15)
AST: 28 U/L (ref 15–41)
BUN: 22 mg/dL — ABNORMAL HIGH (ref 6–20)
CO2: 34 mmol/L — AB (ref 22–32)
Calcium: 9.4 mg/dL (ref 8.9–10.3)
Chloride: 101 mmol/L (ref 101–111)
Creatinine, Ser: 0.96 mg/dL (ref 0.44–1.00)
GFR calc Af Amer: >60 mL/min (ref >60)
GFR calc non Af Amer: 55 mL/min — ABNORMAL LOW (ref >60)
GLUCOSE: 113 mg/dL — AB (ref 65–99)
POTASSIUM: 4.9 mmol/L (ref 3.5–5.1)
SODIUM: 140 mmol/L (ref 135–145)
Total Bilirubin: 0.8 mg/dL (ref 0.3–1.2)
Total Protein: 7.2 g/dL (ref 6.5–8.1)

## 2017-05-06 LAB — URINALYSIS, COMPLETE (UACMP) WITH MICROSCOPIC
Bilirubin Urine: NEGATIVE
Glucose, UA: NEGATIVE mg/dL
Ketones, ur: NEGATIVE mg/dL
Nitrite: NEGATIVE
PROTEIN: NEGATIVE mg/dL
Specific Gravity, Urine: 1.006 (ref 1.005–1.030)
pH: 5 (ref 5.0–8.0)

## 2017-05-06 LAB — CBC
HEMATOCRIT: 33.2 % — AB (ref 35.0–47.0)
HEMOGLOBIN: 10.5 g/dL — AB (ref 12.0–16.0)
MCH: 32.1 pg (ref 26.0–34.0)
MCHC: 31.7 g/dL — AB (ref 32.0–36.0)
MCV: 101.3 fL — ABNORMAL HIGH (ref 80.0–100.0)
Platelets: 271 10*3/uL (ref 150–440)
RBC: 3.28 MIL/uL — ABNORMAL LOW (ref 3.80–5.20)
RDW: 14.8 % — ABNORMAL HIGH (ref 11.5–14.5)
WBC: 11.2 10*3/uL — ABNORMAL HIGH (ref 3.6–11.0)

## 2017-05-06 LAB — GLUCOSE, CAPILLARY: GLUCOSE-CAPILLARY: 89 mg/dL (ref 65–99)

## 2017-05-06 LAB — MRSA PCR SCREENING: MRSA by PCR: POSITIVE — AB

## 2017-05-06 LAB — BRAIN NATRIURETIC PEPTIDE: B NATRIURETIC PEPTIDE 5: 234 pg/mL — AB (ref 0.0–100.0)

## 2017-05-06 MED ORDER — ONDANSETRON HCL 4 MG PO TABS
4.0000 mg | ORAL_TABLET | Freq: Four times a day (QID) | ORAL | Status: DC | PRN
  Administered 2017-05-11: 4 mg via ORAL
  Filled 2017-05-06: qty 1

## 2017-05-06 MED ORDER — DEXTROSE 50 % IV SOLN
25.0000 mL | Freq: Once | INTRAVENOUS | Status: AC
  Administered 2017-05-06: 25 mL via INTRAVENOUS

## 2017-05-06 MED ORDER — ACETAMINOPHEN 325 MG PO TABS: 650.0000 mg | ORAL_TABLET | ORAL | Status: DC | PRN

## 2017-05-06 MED ORDER — ALBUTEROL SULFATE (2.5 MG/3ML) 0.083% IN NEBU: 2.5000 mg | INHALATION_SOLUTION | RESPIRATORY_TRACT | Status: DC | PRN

## 2017-05-06 MED ORDER — SODIUM CHLORIDE 0.9 % IV SOLN
1.0000 g | Freq: Three times a day (TID) | INTRAVENOUS | Status: DC
  Administered 2017-05-06 – 2017-05-10 (×11): 1 g via INTRAVENOUS
  Filled 2017-05-06 (×14): qty 1

## 2017-05-06 MED ORDER — LEVOTHYROXINE SODIUM 50 MCG PO TABS
75.0000 ug | ORAL_TABLET | Freq: Every day | ORAL | Status: DC
  Administered 2017-05-08 – 2017-05-16 (×9): 75 ug via ORAL
  Filled 2017-05-06 (×8): qty 1
  Filled 2017-05-06: qty 2

## 2017-05-06 MED ORDER — FLUTICASONE PROPIONATE 50 MCG/ACT NA SUSP
2.0000 | Freq: Every day | NASAL | Status: DC
  Administered 2017-05-09 – 2017-05-16 (×7): 2 via NASAL
  Filled 2017-05-06 (×2): qty 16

## 2017-05-06 MED ORDER — DEXTROSE 50 % IV SOLN: 25.0000 mL | INTRAVENOUS | Status: DC

## 2017-05-06 MED ORDER — VITAMIN D (ERGOCALCIFEROL) 1.25 MG (50000 UNIT) PO CAPS
50000.0000 [IU] | ORAL_CAPSULE | ORAL | Status: DC
  Administered 2017-05-09: 50000 [IU] via ORAL
  Filled 2017-05-06: qty 1

## 2017-05-06 MED ORDER — ACETAMINOPHEN 325 MG PO TABS
1300.0000 mg | ORAL_TABLET | Freq: Two times a day (BID) | ORAL | Status: DC
  Administered 2017-05-07 – 2017-05-16 (×18): 1300 mg via ORAL
  Filled 2017-05-06 (×18): qty 4

## 2017-05-06 MED ORDER — ACETAMINOPHEN 325 MG PO TABS
650.0000 mg | ORAL_TABLET | Freq: Four times a day (QID) | ORAL | Status: DC | PRN
  Administered 2017-05-07: 650 mg via ORAL
  Filled 2017-05-06: qty 2

## 2017-05-06 MED ORDER — ACETAMINOPHEN ER 650 MG PO TBCR: 1300.0000 mg | EXTENDED_RELEASE_TABLET | Freq: Two times a day (BID) | ORAL | Status: DC

## 2017-05-06 MED ORDER — ONDANSETRON HCL 4 MG/2ML IJ SOLN
4.0000 mg | Freq: Four times a day (QID) | INTRAMUSCULAR | Status: DC | PRN
  Administered 2017-05-11 – 2017-05-14 (×4): 4 mg via INTRAVENOUS
  Filled 2017-05-06 (×3): qty 2

## 2017-05-06 MED ORDER — VANCOMYCIN HCL 10 G IV SOLR
1500.0000 mg | Freq: Once | INTRAVENOUS | Status: AC
  Administered 2017-05-06: 1500 mg via INTRAVENOUS
  Filled 2017-05-06: qty 1500

## 2017-05-06 MED ORDER — CHLORHEXIDINE GLUCONATE 0.12 % MT SOLN
15.0000 mL | Freq: Two times a day (BID) | OROMUCOSAL | Status: DC
  Administered 2017-05-07 – 2017-05-16 (×17): 15 mL via OROMUCOSAL
  Filled 2017-05-06 (×17): qty 15

## 2017-05-06 MED ORDER — ACETAMINOPHEN 650 MG RE SUPP: 650.0000 mg | Freq: Four times a day (QID) | RECTAL | Status: DC | PRN

## 2017-05-06 MED ORDER — ORAL CARE MOUTH RINSE
15.0000 mL | Freq: Two times a day (BID) | OROMUCOSAL | Status: DC
  Administered 2017-05-07 – 2017-05-13 (×8): 15 mL via OROMUCOSAL

## 2017-05-06 MED ORDER — DEXTROSE 50 % IV SOLN
INTRAVENOUS | Status: AC
  Filled 2017-05-06: qty 50

## 2017-05-06 MED ORDER — ALUM & MAG HYDROXIDE-SIMETH 200-200-20 MG/5ML PO SUSP
30.0000 mL | ORAL | Status: DC | PRN
Start: 2017-05-06 — End: 2017-05-16
  Administered 2017-05-10 – 2017-05-11 (×2): 30 mL via ORAL
  Filled 2017-05-06 (×2): qty 30

## 2017-05-06 MED ORDER — IPRATROPIUM-ALBUTEROL 0.5-2.5 (3) MG/3ML IN SOLN
3.0000 mL | Freq: Four times a day (QID) | RESPIRATORY_TRACT | Status: DC
  Administered 2017-05-06 – 2017-05-10 (×14): 3 mL via RESPIRATORY_TRACT
  Filled 2017-05-06 (×9): qty 3
  Filled 2017-05-06: qty 30
  Filled 2017-05-06 (×4): qty 3

## 2017-05-06 MED ORDER — ENOXAPARIN SODIUM 40 MG/0.4ML ~~LOC~~ SOLN
40.0000 mg | SUBCUTANEOUS | Status: DC
  Administered 2017-05-06: 40 mg via SUBCUTANEOUS
  Filled 2017-05-06: qty 0.4

## 2017-05-06 NOTE — ED Triage Notes (Signed)
Pt to ED from [facility] after family reports pt has been acting different than normal. Pt is alert and oriented x 4 with EMS and upon arrival to ED. Pt verbalized she is having left shoulder ain without injury but has no other complaints at this time. Pt reports she has trouble sleeping at night and stays up all night with roommate.

## 2017-05-06 NOTE — ED Provider Notes (Signed)
[facility] Emergency Department Provider Note  ____________________________________________   First MD Initiated Contact with Patient 05/06/17 1621     (approximate)  I have reviewed the triage vital signs and the nursing notes.   HISTORY  Chief Complaint Altered Mental Status  Level 5 caveat:  history/ROS limited by altered mental status/confusion   HPI JONNETTE NUON is a 82 y.o. female with an extensive chronic medical history as listed below who presents for evaluation of altered mental status.  She lives at [facility] and her family is concerned because she has "been talking out of her head" for the last couple of days, gradually getting worse over time.  She is reportedly seeing dead people, spiders, and children in her room.  She has not been particularly agitated or complaining of pain.  She has chronic respiratory failure and is on oxygen all the time and she denies having any increased work of breathing.  She denies chest pain and abdominal pain and has had no recent nausea or vomiting.  She states that she knows she has been "a little off" lately.  She drifts off to sleep during our conversation and will begin to ramble or say things that do not make sense and context from time to time during my interview with her and her daughter.  Daughter feels that her altered mental status is severe.  She points out that the patient was given 40 mg of Lasix before leaving the nursing home and she is also on day 10 of daily antibiotic injections for a urinary tract infection which the daughter says is the result of Escherichia coli although there is no documentation of this sent with the patient; however her MAR does list ertapenem 1 g daily.  Her daughter also commented that the patient has gotten like this in the past (infusion, sleeping) when her CO2 level was too high.  She uses oxygen chronically but does not use a CPAP or BiPAP at  night.   Past Medical History:  Diagnosis Date  . Anxiety   . CHF (congestive heart failure) ([place])   . COPD (chronic obstructive pulmonary disease) ([place])   . Depression   . GERD (gastroesophageal reflux disease)   . Gout   . Hyperlipidemia   . Hypertension   . Hypothyroid   . Osteoarthritis   . Recurrent UTI   . Skin cancer   . Urethral stricture     Patient Active Problem List   Diagnosis Date Noted  . Respiratory failure ([place]) 05/06/2017  . Chronic respiratory failure ([place]) 02/24/2016  . Pressure ulcer 02/08/2016  . Sepsis ([place]) 02/07/2016  . Acute encephalopathy 01/10/2016  . Delirium 01/07/2016  . Acute cystitis without hematuria 01/07/2016  . Hyperkalemia 01/07/2016  . Hyponatremia 01/07/2016  . Anemia 01/07/2016  . Morbid obesity ([place]) 01/07/2016  . Body aches 01/07/2016  . Chronic diastolic heart failure ([place]) 03/19/2015  . COPD (chronic obstructive pulmonary disease) ([place]) 03/19/2015    Past Surgical History:  Procedure Laterality Date  . CHOLECYSTECTOMY    . PARTIAL HYSTERECTOMY      Prior to Admission medications   Medication Sig Start Date End Date Taking? Authorizing Provider  acetaminophen (TYLENOL) 325 MG tablet Take 650 mg by mouth every 4 (four) hours as needed.   Yes [provider]  acetaminophen (TYLENOL) 650 MG CR tablet Take 1,300 mg by mouth 2 (two) times daily.   Yes [provider]  albuterol (PROVENTIL HFA;VENTOLIN HFA)  108 (90 BASE) MCG/ACT inhaler Inhale 2 puffs into the lungs every 4 (four) hours as needed for wheezing or shortness of breath.   Yes [provider]  allopurinol (ZYLOPRIM) 100 MG tablet Take 100 mg by mouth daily.    Yes [provider]  alum & mag hydroxide-simeth (MAALOX/MYLANTA) 200-200-20 MG/5ML suspension Take 30 mLs by mouth every 4 (four) hours as needed for indigestion or heartburn.   Yes [provider]  bisacodyl (DULCOLAX) 5 MG EC tablet Take 1 tablet (5 mg total) by  mouth daily as needed for moderate constipation. 02/11/16  Yes Gouru, Illene Silver, MD  budesonide-formoterol (SYMBICORT) 160-4.5 MCG/ACT inhaler Inhale 2 puffs into the lungs 2 (two) times daily.   Yes [provider]  calcium carbonate (TUMS) 500 MG chewable tablet Chew 1 tablet by mouth every 4 (four) hours as needed for indigestion or heartburn.   Yes [provider]  carvedilol (COREG) 6.25 MG tablet Take 6.25 mg by mouth 2 (two) times daily with a meal.   Yes [provider]  Cranberry-Vitamin C-Inulin (UTI-STAT) LIQD Take 30 mLs by mouth daily.   Yes [provider]  cyanocobalamin 500 MCG tablet Take 500 mcg by mouth 2 (two) times daily.   Yes [provider]  docusate sodium (COLACE) 100 MG capsule Take 1 capsule (100 mg total) by mouth 2 (two) times daily. 02/11/16  Yes Gouru, Illene Silver, MD  ertapenem (INVANZ) IVPB Inject 1 g into the vein daily. 04/29/17 05/09/17 Yes [provider]  ferrous sulfate 325 (65 FE) MG tablet Take 325 mg by mouth 2 (two) times daily with a meal.    Yes [provider]  fluticasone (FLONASE) 50 MCG/ACT nasal spray Place 2 sprays into both nostrils daily. 01/13/16  Yes Dustin Flock, MD  furosemide (LASIX) 20 MG tablet Take 2 tablets (40 mg total) by mouth daily. Patient taking differently: Take 60 mg by mouth daily.  01/13/16  Yes Dustin Flock, MD  gabapentin (NEURONTIN) 100 MG capsule Take 200 mg by mouth 2 (two) times daily.    Yes [provider]  guaiFENesin-dextromethorphan (ROBITUSSIN DM) 100-10 MG/5ML syrup Take 10 mLs by mouth every 4 (four) hours as needed for cough.   Yes [provider]  hydrOXYzine (ATARAX/VISTARIL) 25 MG tablet Take 25 mg by mouth every 6 (six) hours as needed.   Yes [provider]  ipratropium-albuterol (DUONEB) 0.5-2.5 (3) MG/3ML SOLN Take 3 mLs by nebulization every 6 (six) hours. 01/13/16  Yes Dustin Flock, MD  levothyroxine (SYNTHROID, LEVOTHROID)  75 MCG tablet Take 75 mcg by mouth daily before breakfast.   Yes [provider]  nystatin cream (MYCOSTATIN) Apply 1 application topically every 8 (eight) hours as needed (for yeast). Pt applies to all skin folds.   Yes [provider]  omeprazole (PRILOSEC) 20 MG capsule Take 20 mg by mouth 2 (two) times daily before a meal.    Yes [provider]  PARoxetine (PAXIL) 30 MG tablet Take 30 mg by mouth daily.   Yes [provider]  QUEtiapine (SEROQUEL) 25 MG tablet Take 25 mg by mouth at bedtime.    Yes [provider]  saccharomyces boulardii (FLORASTOR) 250 MG capsule Take 250 mg by mouth 2 (two) times daily. 04/28/17 05/28/17 Yes [provider]  Skin Protectants, Misc. (CALAZIME SKIN PROTECTANT EX) Apply 1 application topically 4 (four) times daily as needed (for rash). Pt applies to buttocks.   Yes [provider]  valsartan (DIOVAN) 80  MG tablet Take 80 mg by mouth daily.    Yes [provider]  Vitamin D, Ergocalciferol, (DRISDOL) 50000 units CAPS capsule Take 50,000 Units by mouth every 30 (thirty) days.   Yes [provider]  Amino Acids-Protein Hydrolys (FEEDING SUPPLEMENT, PRO-STAT SUGAR FREE 64,) LIQD Take 30 mLs by mouth 2 (two) times daily. 02/11/16   Gouru, Illene Silver, MD  clonazePAM (KLONOPIN) 0.5 MG tablet Take 1 tablet (0.5 mg total) by mouth at bedtime. Patient not taking: Reported on 05/06/2017 02/11/16   Nicholes Mango, MD  linezolid (ZYVOX) 600 MG tablet Take 1 tablet (600 mg total) by mouth 2 (two) times daily. Patient not taking: Reported on 05/06/2017 02/11/16   Nicholes Mango, MD    Allergies Nsaids and Tramadol  Family History  Problem Relation Age of Onset  . Colon cancer Brother   . Heart attack Father     Social History Social History  Substance Use Topics  . Smoking status: Former Smoker    Packs/day: 0.25    Quit date: 03/18/1994  . Smokeless tobacco: Never Used  . Alcohol use No    Review  of Systems Level 5 caveat:  history/ROS limited by altered mental status/confusion  ____________________________________________   PHYSICAL EXAM:  VITAL SIGNS: ED Triage Vitals   Enc Vitals Group     BP 138/66     Pulse Rate 69     Resp 17     Temp 98.3 F (36.8 C)     Temp Source Oral     SpO2 99 %     Weight (!) 153.4 kg (338 lb 3 oz)     Height      Head Circumference      Peak Flow      Pain Score      Pain Loc      Pain Edu?      Excl. in [place]?     Constitutional: Alert But confused.  Intermittently talking or making comments that do not make sense and contacts.  Appears chronically ill but not in acute distress at this time Eyes: Conjunctivae are normal.  Head: Atraumatic. Nose: No congestion/rhinnorhea. Mouth/Throat: Mucous membranes are moist. Neck: No stridor.  No meningeal signs.   Cardiovascular: Normal rate, regular rhythm. Good peripheral circulation. Grossly normal heart sounds. Respiratory: Decreased breath sounds throughout with normal respiratory effort and no retractions .   Ausculatory exam is limited by body habitus.   Gastrointestinal: Morbid obesity.  Soft and nontender. No distention.  Musculoskeletal: No gross deformities of extremities. Neurologic:  Moving all 4 extremities but unable to cooperate with full neuro exam.  No evidence of any gross neurological deficits at this time. Skin:  Skin is pale, warm, dry and intact. No rash noted.   ____________________________________________   LABS (all labs ordered are listed, but only abnormal results are displayed)  Labs Reviewed  COMPREHENSIVE METABOLIC PANEL - Abnormal; Notable for the following:       Result Value   CO2 34 (*)    Glucose, Bld 113 (*)    BUN 22 (*)    Albumin 3.2 (*)    GFR calc non Af Amer 55 (*)    All other components within normal limits  CBC - Abnormal; Notable for the following:    WBC 11.2 (*)    RBC 3.28 (*)    Hemoglobin 10.5 (*)    HCT 33.2 (*)    MCV 101.3  (*)    MCHC 31.7 (*)  RDW 14.8 (*)    All other components within normal limits  URINALYSIS, COMPLETE (UACMP) WITH MICROSCOPIC - Abnormal; Notable for the following:    Color, Urine YELLOW (*)    APPearance CLOUDY (*)    Hgb urine dipstick SMALL (*)    Leukocytes, UA LARGE (*)    Bacteria, UA RARE (*)    Squamous Epithelial / LPF 0-5 (*)    All other components within normal limits  BLOOD GAS, ARTERIAL - Abnormal; Notable for the following:    pH, Arterial 7.27 (*)    pCO2 arterial 84 (*)    pO2, Arterial 80 (*)    Bicarbonate 38.6 (*)    Acid-Base Excess 9.1 (*)    All other components within normal limits  BLOOD GAS, ARTERIAL - Abnormal; Notable for the following:    pH, Arterial 7.26 (*)    pCO2 arterial 84 (*)    Bicarbonate 37.7 (*)    Acid-Base Excess 8.1 (*)    All other components within normal limits  URINE CULTURE  CULTURE, BLOOD (ROUTINE X 2)  CULTURE, BLOOD (ROUTINE X 2)  BLOOD GAS, ARTERIAL  BRAIN NATRIURETIC PEPTIDE  CBG MONITORING, ED   ____________________________________________  EKG  ED ECG REPORT I, Jashon Ishida, the attending physician, personally viewed and interpreted this ECG.  Date: 05/06/2017 EKG Time: 15:50 Rate: 68 Rhythm: Sinus rhythm with premature atrial complexes QRS Axis: normal Intervals: normal ST/T Wave abnormalities: Non-specific ST segment / T-wave changes, but no evidence of acute ischemia. Narrative Interpretation: no evidence of acute ischemia   ____________________________________________  RADIOLOGY   Dg Chest Portable 1 View  Result Date: 05/06/2017 CLINICAL DATA:  Shortness of breath.  Altered mental status. EXAM: PORTABLE CHEST 1 VIEW COMPARISON:  February 09, 2016 FINDINGS: No pneumothorax. Interstitial opacities diffusely on the right. More focal opacity in left retrocardiac region. No other interval changes. IMPRESSION: 1. Diffuse interstitial opacities on the right may represent asymmetric edema or developing  infiltrate. More focal infiltrate is seen in the left base, similar since the previous study. Electronically Signed   By: Dorise Bullion III M.D   On: 05/06/2017 17:21    ____________________________________________   PROCEDURES  Critical Care performed: Yes, see critical care procedure note(s)   Procedure(s) performed:   .Critical Care Performed by: Hinda Kehr Authorized by: Hinda Kehr   Critical care provider statement:    Critical care time (minutes):  40   Critical care time was exclusive of:  Separately billable procedures and treating other patients   Critical care was necessary to treat or prevent imminent or life-threatening deterioration of the following conditions:  Respiratory failure   Critical care was time spent personally by me on the following activities:  Development of treatment plan with patient or surrogate, discussions with consultants, evaluation of patient's response to treatment, examination of patient, obtaining history from patient or surrogate, ordering and performing treatments and interventions, ordering and review of laboratory studies, ordering and review of radiographic studies, pulse oximetry, re-evaluation of patient's condition and review of old charts     ____________________________________________   INITIAL IMPRESSION / [facility] / ED COURSE  Pertinent labs & imaging results that were available during my care of the patient were reviewed by me and considered in my medical decision making (see chart for details).  In And out catheterization was performed upon her arrival and the urinalysis appears grossly infected in spite of her recent treatment with ertapenem.  I am sending a urine culture.  I am evaluating him broadly with lab work and I have asked respiratory therapy to obtain an ABG to evaluate for hypercapnia and hypoxia.  No indication for head CT or other imaging at this time except for a chest x-ray; she has probable  hypercapnia and a definite urinary tract infection both of which can cause altered mental status.  She has no gross neurological deficits.  A review of the EMR demonstrates that more than a year ago she had a blood culture positive for VRE but I do not see any prior urine cultures.   Clinical Course as of May 06 2058  Thu May 06, 2017  1714 ABG demonstrates pH of 7.27, PCO2 of 84, and PO2 of 80 in spite of her oxygen by nasal cannula.  We are starting BiPAP.  [CF]  2057 Delayed documentation due to multiple ill patients in the emergency department.  In summary, the patient's labs are notable for a very small leukocytosis at 11.2.  Chemistry was unremarkable.  ABG was notable for mild acidosis at 7.26 pH and significant hypercapnia at PCO2 84 and hypoxia with PO2 of 80.  She was started on BiPAP.  I called and spoke by phone with infectious disease, Dr. Ola Spurr, and explained the situation.  He recommended starting meropenem and vancomycin for broad antibacterial coverage for her UTI and to try and find the results of her recent prior urine culture.  I have dated the patient and family for the need for admission and they understand and agree.  [CF]    Clinical Course User Index [CF] Hinda Kehr, MD    ____________________________________________  FINAL CLINICAL IMPRESSION(S) / ED DIAGNOSES  Final diagnoses:  Acute on chronic respiratory failure with hypoxia and hypercapnia ([place])  Delirium  Urinary tract infection without hematuria, site unspecified     MEDICATIONS GIVEN DURING THIS VISIT:  Medications  meropenem (MERREM) 1 g in sodium chloride 0.9 % 100 mL IVPB (not administered)  vancomycin (VANCOCIN) 1,500 mg in sodium chloride 0.9 % 500 mL IVPB (0 mg Intravenous Stopped 05/06/17 2018)     NEW OUTPATIENT MEDICATIONS STARTED DURING THIS VISIT:  New Prescriptions   No medications on file    Modified Medications   No medications on file    Discontinued Medications    ACETAMINOPHEN (TYLENOL) 500 MG TABLET    Take 500 mg by mouth every 8 (eight) hours as needed for mild pain.    LORATADINE (CLARITIN) 10 MG TABLET    Take 10 mg by mouth daily.   SKIN PROTECTANTS, MISC. (EUCERIN) CREAM    Apply 1 application topically 2 (two) times daily.   TIOTROPIUM (SPIRIVA) 18 MCG INHALATION CAPSULE    Place 18 mcg into inhaler and inhale daily.     Note:  This document was prepared using Dragon voice recognition software and may include unintentional dictation errors.    Hinda Kehr, MD 05/06/17 2059

## 2017-05-06 NOTE — ED Notes (Signed)
Attempted to call report. RN Tess to call back.

## 2017-05-06 NOTE — ED Notes (Signed)
Pt was given 40mg  lasix before leaving [facility]. Family has arrived and reports pt has been talking to people who are not in the room ad seeing people that are not there. Daughter reports pt has been seeing children and dead family. Daughter also reports pt has been sleeping more than normal and had been staring at nothing and reaching for things that are not there. Pt is currently on day 10 of antibiotic treatment for a Ecoli UTI.

## 2017-05-06 NOTE — ED Notes (Signed)
Pt cleaned up and changed with EDT Scott assisting. Pt repositioned in the bed.

## 2017-05-06 NOTE — H&P (Signed)
[facility] [place] NAME: Kathy Guzman    MR#:  [id number]  DATE OF BIRTH:  1935/09/27  DATE OF ADMISSION:  05/06/2017  PRIMARY CARE PHYSICIAN: Margarita Rana, MD   REQUESTING/REFERRING PHYSICIAN: Enid Skeens MD CHIEF COMPLAINT:   Chief Complaint  Patient presents with  . Altered Mental Status    HISTORY OF PRESENT ILLNESS: Kathy Guzman  is a 82 y.o. female with a known history of Morbid obesity, anxiety, congestive heart failure type unknown, COPD, depression, GERD, gout, essential hypertension, recurrent UTIs as well as chronic respiratory failure requiring oxygen who is not on CPAP at nighttime who is brought to the emergency room with altered mental status. According to daughter who is at bedside states the patient has had confusion and hallucinations ongoing for the past 1 week. She was diagnosed with a UTI and treated with IV antibiotics. Patient's continued to get worse therefore she is brought to the emergency room. Noticed to have hypercarbic respiratory failure as well as persistent urinary tract infection. Patient's is currently lethargic but able to answer some questions. PAST MEDICAL HISTORY:   Past Medical History:  Diagnosis Date  . Anxiety   . CHF (congestive heart failure) ([place])   . COPD (chronic obstructive pulmonary disease) ([place])   . Depression   . GERD (gastroesophageal reflux disease)   . Gout   . Hyperlipidemia   . Hypertension   . Hypothyroid   . Osteoarthritis   . Recurrent UTI   . Skin cancer   . Urethral stricture     PAST SURGICAL HISTORY: Past Surgical History:  Procedure Laterality Date  . CHOLECYSTECTOMY    . PARTIAL HYSTERECTOMY      SOCIAL HISTORY:  Social History  Substance Use Topics  . Smoking status: Former Smoker    Packs/day: 0.25    Quit date: 03/18/1994  . Smokeless tobacco: Never Used  . Alcohol use No    FAMILY HISTORY:  Family History  Problem Relation Age of Onset  . Colon  cancer Brother   . Heart attack Father     DRUG ALLERGIES:  Allergies  Allergen Reactions  . Nsaids Other (See Comments)    Reaction:  Unknown   . Tramadol Itching    REVIEW OF SYSTEMS:   CONSTITUTIONAL: Unable to provide  MEDICATIONS AT HOME:  Prior to Admission medications   Medication Sig Start Date End Date Taking? Authorizing Provider  acetaminophen (TYLENOL) 500 MG tablet Take 500 mg by mouth every 8 (eight) hours as needed for mild pain.     [provider]  albuterol (PROVENTIL HFA;VENTOLIN HFA) 108 (90 BASE) MCG/ACT inhaler Inhale 2 puffs into the lungs every 4 (four) hours as needed for wheezing or shortness of breath.    [provider]  allopurinol (ZYLOPRIM) 100 MG tablet Take 100 mg by mouth daily.     [provider]  Amino Acids-Protein Hydrolys (FEEDING SUPPLEMENT, PRO-STAT SUGAR FREE 64,) LIQD Take 30 mLs by mouth 2 (two) times daily. 02/11/16   Gouru, Illene Silver, MD  bisacodyl (DULCOLAX) 5 MG EC tablet Take 1 tablet (5 mg total) by mouth daily as needed for moderate constipation. 02/11/16   Gouru, Illene Silver, MD  budesonide-formoterol (SYMBICORT) 160-4.5 MCG/ACT inhaler Inhale 2 puffs into the lungs 2 (two) times daily.    [provider]  calcium carbonate (TUMS) 500 MG chewable tablet Chew 1 tablet by mouth every 4 (four) hours as needed for indigestion or heartburn.  [provider]  clonazePAM (KLONOPIN) 0.5 MG tablet Take 1 tablet (0.5 mg total) by mouth at bedtime. 02/11/16   Nicholes Mango, MD  cyanocobalamin 500 MCG tablet Take 500 mcg by mouth 2 (two) times daily.    [provider]  docusate sodium (COLACE) 100 MG capsule Take 1 capsule (100 mg total) by mouth 2 (two) times daily. 02/11/16   Gouru, Illene Silver, MD  ferrous sulfate 325 (65 FE) MG tablet Take 325 mg by mouth daily with breakfast.     [provider]  fluticasone (FLONASE) 50 MCG/ACT nasal spray Place 2 sprays into both nostrils daily. 01/13/16   Dustin Flock, MD  furosemide (LASIX) 20 MG tablet Take 2 tablets (40 mg total) by mouth daily. 01/13/16   Dustin Flock, MD  gabapentin (NEURONTIN) 300 MG capsule Take 300 mg by mouth 3 (three) times daily.    [provider]  guaiFENesin-dextromethorphan (ROBITUSSIN DM) 100-10 MG/5ML syrup Take 10 mLs by mouth every 4 (four) hours as needed for cough.    [provider]  ipratropium-albuterol (DUONEB) 0.5-2.5 (3) MG/3ML SOLN Take 3 mLs by nebulization every 6 (six) hours. 01/13/16   Dustin Flock, MD  levothyroxine (SYNTHROID, LEVOTHROID) 75 MCG tablet Take 75 mcg by mouth daily before breakfast.    [provider]  linezolid (ZYVOX) 600 MG tablet Take 1 tablet (600 mg total) by mouth 2 (two) times daily. 02/11/16   Gouru, Illene Silver, MD  loratadine (CLARITIN) 10 MG tablet Take 10 mg by mouth daily.    [provider]  nystatin cream (MYCOSTATIN) Apply 1 application topically every 8 (eight) hours as needed (for yeast). Pt applies to all skin folds.    [provider]  omeprazole (PRILOSEC) 20 MG capsule Take 20 mg by mouth daily.     [provider]  PARoxetine (PAXIL) 30 MG tablet Take 30 mg by mouth daily.    [provider]  QUEtiapine (SEROQUEL) 25 MG tablet Take 50 mg by mouth at bedtime.    [provider]  Skin Protectants, Misc. (CALAZIME SKIN PROTECTANT EX) Apply 1 application topically 4 (four) times daily as needed (for rash). Pt applies to buttocks.    [provider]  Skin Protectants, Misc. (EUCERIN) cream Apply 1 application topically 2 (two) times daily.    [provider]  tiotropium (SPIRIVA) 18 MCG inhalation capsule Place 18 mcg into inhaler and inhale daily.    [provider]  valsartan (DIOVAN) 160 MG tablet Take 160 mg by mouth daily.    [provider]  Vitamin D, Ergocalciferol, (DRISDOL) 50000 units CAPS capsule Take 50,000 Units by mouth every 30 (thirty) days.     [provider]      PHYSICAL EXAMINATION:   VITAL SIGNS: Blood pressure 138/66, pulse 69, temperature 98.3 F (36.8 C), temperature source Oral, resp. rate 17, weight (!) 338 lb 3 oz (153.4 kg), SpO2 99 %.  GENERAL:  82 y.o.-year-old patient lying in the bed with no acute distress. Morbidly obese  EYES: Pupils equal, round, reactive to light and accommodation. No scleral icterus. Marland Kitchen  HEENT: Head atraumatic, normocephalic. Oropharynx and nasopharynx clear.  NECK:  Supple, no jugular venous distention. No thyroid enlargement, no tenderness.  LUNGS: Diminished breath sounds bilaterally  CARDIOVASCULAR: S1, S2 normal. No murmurs, rubs, or gallops.  ABDOMEN: Soft, nontender, nondistended. Bowel sounds present. No organomegaly or mass.  EXTREMIPositivedal edema, cyanosis, or clubbing.  NEUROLOGIC: Cranial nerves II through XII are intact. Muscle strength  5/5 in all extremities. Sensation intact. Gait not checked.  PSYCHIATRIC: The patient is alert and oriented x 3.  SKIN: No obvious rash, lesion, or ulcer.   LABORATORY PANEL:   CBC  Recent Labs Lab 05/06/17 1553  WBC 11.2*  HGB 10.5*  HCT 33.2*  PLT 271  MCV 101.3*  MCH 32.1  MCHC 31.7*  RDW 14.8*   ------------------------------------------------------------------------------------------------------------------  Chemistries   Recent Labs Lab 05/06/17 1553  NA 140  K 4.9  CL 101  CO2 34*  GLUCOSE 113*  BUN 22*  CREATININE 0.96  CALCIUM 9.4  AST 28  ALT 14  ALKPHOS 105  BILITOT 0.8   ------------------------------------------------------------------------------------------------------------------ CrCl cannot be calculated (Unknown ideal weight.). ------------------------------------------------------------------------------------------------------------------ No results for input(s): TSH, T4TOTAL, T3FREE, THYROIDAB in the last 72 hours.  Invalid input(s): FREET3   Coagulation profile No results for  input(s): INR, PROTIME in the last 168 hours. ------------------------------------------------------------------------------------------------------------------- No results for input(s): DDIMER in the last 72 hours. -------------------------------------------------------------------------------------------------------------------  Cardiac Enzymes No results for input(s): CKMB, TROPONINI, MYOGLOBIN in the last 168 hours.  Invalid input(s): CK ------------------------------------------------------------------------------------------------------------------ Invalid input(s): POCBNP  ---------------------------------------------------------------------------------------------------------------  Urinalysis    Component Value Date/Time   COLORURINE YELLOW (A) 05/06/2017 1610   APPEARANCEUR CLOUDY (A) 05/06/2017 1610   APPEARANCEUR Cloudy 08/30/2014 1817   LABSPEC 1.006 05/06/2017 1610   LABSPEC 1.005 08/30/2014 1817   PHURINE 5.0 05/06/2017 1610   GLUCOSEU NEGATIVE 05/06/2017 1610   GLUCOSEU Negative 08/30/2014 1817   HGBUR SMALL (A) 05/06/2017 1610   BILIRUBINUR NEGATIVE 05/06/2017 1610   BILIRUBINUR Negative 08/30/2014 1817   KETONESUR NEGATIVE 05/06/2017 1610   PROTEINUR NEGATIVE 05/06/2017 1610   NITRITE NEGATIVE 05/06/2017 1610   LEUKOCYTESUR LARGE (A) 05/06/2017 1610   LEUKOCYTESUR 3+ 08/30/2014 1817     RADIOLOGY: No results found.  EKG: Orders placed or performed during the hospital encounter of 05/06/17  . EKG 12-Lead  . EKG 12-Lead    IMPRESSION AND PLAN: Patient is a 81 year old morbidly obese female being admitted with acute encephalopathy  1. Hypercarbic respiratory failure Suspect due to urinary tract infection as well as likely underlying sleep apnea At this point we'll continue BiPAP she will be admitted to stepdown I have discussed the case with e link Patient does have abnormal chest x-ray possible pneumonia on the right she will be on antibiotics  2.  Recurrent urinary tract infection failed outpatient IV antibiotics ER physician has discussed the case with Dr. Ola Spurr who recommends IV vancomycin and meropenem will follow blood cultures urine cultures  3. History of COPD I'll place her on nebs and Pulmicort nebs Hold off on steroids for now  4. Essential hypertension we'll hold Coreg  5. Generalized anxiety disorder we will hold her Klonopin  6. CODE STATUS DO NOT RESUSCITATE confirmed with the family patient has a yellow" form  7. Miscellaneous Lovenox for DVT prophylaxis      All the records are reviewed and case discussed with ED provider. Management plans discussed with the patient, family and they are in agreement.  CODE STATUS: Code Status History    Date Active Date Inactive Code Status Order ID Comments User Context   02/07/2016  4:30 PM 02/11/2016  8:16 PM DNR [id number]  Hillary Bow, MD ED   01/07/2016 10:25 PM 01/13/2016  7:22 PM Full Code [id number]  Theodoro Grist, MD ED    Questions for Most Recent Historical Code Status (Order [id number])    Question Answer Comment   In the event of cardiac or  respiratory ARREST Do not call a "code blue"    In the event of cardiac or respiratory ARREST Do not perform Intubation, CPR, defibrillation or ACLS    In the event of cardiac or respiratory ARREST Use medication by any route, position, wound care, and other measures to relive pain and suffering. May use oxygen, suction and manual treatment of airway obstruction as needed for comfort.         Advance Directive Documentation     Most Recent Value  Type of Advance Directive  Out of facility DNR (pink MOST or yellow form)  Pre-existing out of facility DNR order (yellow form or pink MOST form)  Yellow form placed in chart (order not valid for inpatient use)  "MOST" Form in Place?  -       TOTAL TIME TAKING CARE OF THIS PATIENT: 84minutes  critical care time spent    Dustin Flock M.D on 05/06/2017 at 4:51  PM  Between 7am to 6pm - Pager - [phone]  After 6pm go to [email or web address] - password [email or web address] [facility]  [facility]  Office  [phone]  CC: Primary care physician; Margarita Rana, MD

## 2017-05-06 NOTE — ED Notes (Signed)
Pt changed and linens changed. Pt repositioned in the bed.

## 2017-05-06 NOTE — Consult Note (Signed)
Name: Kathy Guzman MRN: [id number] DOB: 1935-01-16    ADMISSION DATE:  05/06/2017 CONSULTATION DATE: 05/06/2017  REFERRING MD : Dr. Posey Pronto   CHIEF COMPLAINT: Altered Mental Status   BRIEF PATIENT DESCRIPTION:  82 yo female admitted 07/5 with acute encephalopathy secondary to hypercapnia, persistent UTI and acute on chronic hypercapnic respiratory failure secondary to pulmonary edema vs. pneumonia   SIGNIFICANT EVENTS  07/5-Pt admitted to the [facility] with acute respiratory failure requiring BiPAP  STUDIES:  02/08/16 ECHO>>Not well visualized. The cavity size was normal.  Systolic function was normal. The estimated ejection fraction was  in the range of 60% to 65%.  HISTORY OF PRESENT ILLNESS:   This is a 82 yo female with a PMH of Urethral Stricture, Skin Cancer, Recurrent UTI, Osteoarthritis, Hypothyroidism, HTN, Hyperlipidemia, Gout, GERD, Depression, COPD, CHF, and Anxiety.  She presented to [facility] ER 07/5 from [facility] with increased lethargy and confusion.  Per ER notes the pts family reported the pt was "acting different than normal," however upon EMS arrival the pt was alert and oriented.  In the ER the pt was complaining of left shoulder pain, however she stated she did not injure herself.  The family reported the pt is currently on day 26 of iv antibiotic treatment due to diagnosis of a UTI.  Prior to EMS arrival she was given 40 mg IV lasix at [facility].  In the ER abg revealed pH 7.27 and pCO2 84, therefore she was placed on continuous Bipap and admitted by hospitalist team to the [facility] Unit for further workup and treatment PCCM consulted.    PAST MEDICAL HISTORY :   has a past medical history of Anxiety; CHF (congestive heart failure) ([place]); COPD (chronic obstructive pulmonary disease) ([place]); Depression; GERD (gastroesophageal reflux disease); Gout; Hyperlipidemia; Hypertension; Hypothyroid; Osteoarthritis; Recurrent UTI; Skin cancer; and Urethral stricture.  has a past surgical history that includes Partial hysterectomy and Cholecystectomy. Prior to Admission medications   Medication Sig Start Date End Date Taking? Authorizing Provider  acetaminophen (TYLENOL) 325 MG tablet Take 650 mg by mouth every 4 (four) hours as needed.   Yes [provider]  acetaminophen (TYLENOL) 650 MG CR tablet Take 1,300 mg by mouth 2 (two) times daily.   Yes [provider]  albuterol (PROVENTIL HFA;VENTOLIN HFA) 108 (90 BASE) MCG/ACT inhaler Inhale 2 puffs into the lungs every 4 (four) hours as needed for wheezing or shortness of breath.   Yes [provider]  allopurinol (ZYLOPRIM) 100 MG tablet Take 100 mg by mouth daily.    Yes [provider]  alum & mag hydroxide-simeth (MAALOX/MYLANTA) 200-200-20 MG/5ML suspension Take 30 mLs by mouth every 4 (four) hours as needed for indigestion or heartburn.   Yes [provider]  bisacodyl (DULCOLAX) 5 MG EC tablet Take 1 tablet (5 mg total) by mouth daily as needed for moderate constipation. 02/11/16  Yes Gouru, Illene Silver, MD  budesonide-formoterol (SYMBICORT) 160-4.5 MCG/ACT inhaler Inhale 2 puffs into the lungs 2 (two) times daily.   Yes [provider]  calcium carbonate (TUMS) 500 MG chewable tablet Chew 1 tablet by mouth every 4 (four) hours as needed for indigestion or heartburn.   Yes [provider]  carvedilol (COREG) 6.25 MG tablet Take 6.25 mg by mouth 2 (two) times daily with a meal.   Yes [provider]  Cranberry-Vitamin C-Inulin (UTI-STAT) LIQD Take 30 mLs by mouth daily.   Yes [provider]  cyanocobalamin 500 MCG tablet Take 500  mcg by mouth 2 (two) times daily.   Yes [provider]  docusate sodium (COLACE) 100 MG capsule Take 1 capsule (100 mg total) by mouth 2 (two) times daily. 02/11/16  Yes Gouru, Illene Silver, MD  ertapenem (INVANZ) IVPB Inject 1 g into the vein daily. 04/29/17 05/09/17 Yes [provider]  ferrous  sulfate 325 (65 FE) MG tablet Take 325 mg by mouth 2 (two) times daily with a meal.    Yes [provider]  fluticasone (FLONASE) 50 MCG/ACT nasal spray Place 2 sprays into both nostrils daily. 01/13/16  Yes Dustin Flock, MD  furosemide (LASIX) 20 MG tablet Take 2 tablets (40 mg total) by mouth daily. Patient taking differently: Take 60 mg by mouth daily.  01/13/16  Yes Dustin Flock, MD  gabapentin (NEURONTIN) 100 MG capsule Take 200 mg by mouth 2 (two) times daily.    Yes [provider]  guaiFENesin-dextromethorphan (ROBITUSSIN DM) 100-10 MG/5ML syrup Take 10 mLs by mouth every 4 (four) hours as needed for cough.   Yes [provider]  hydrOXYzine (ATARAX/VISTARIL) 25 MG tablet Take 25 mg by mouth every 6 (six) hours as needed.   Yes [provider]  ipratropium-albuterol (DUONEB) 0.5-2.5 (3) MG/3ML SOLN Take 3 mLs by nebulization every 6 (six) hours. 01/13/16  Yes Dustin Flock, MD  levothyroxine (SYNTHROID, LEVOTHROID) 75 MCG tablet Take 75 mcg by mouth daily before breakfast.   Yes [provider]  nystatin cream (MYCOSTATIN) Apply 1 application topically every 8 (eight) hours as needed (for yeast). Pt applies to all skin folds.   Yes [provider]  omeprazole (PRILOSEC) 20 MG capsule Take 20 mg by mouth 2 (two) times daily before a meal.    Yes [provider]  PARoxetine (PAXIL) 30 MG tablet Take 30 mg by mouth daily.   Yes [provider]  QUEtiapine (SEROQUEL) 25 MG tablet Take 25 mg by mouth at bedtime.    Yes [provider]  saccharomyces boulardii (FLORASTOR) 250 MG capsule Take 250 mg by mouth 2 (two) times daily. 04/28/17 05/28/17 Yes [provider]  Skin Protectants, Misc. (CALAZIME SKIN PROTECTANT EX) Apply 1 application topically 4 (four) times daily as needed (for rash). Pt applies to buttocks.   Yes [provider]  valsartan (DIOVAN) 80 MG tablet Take 80 mg by mouth daily.     Yes [provider]  Vitamin D, Ergocalciferol, (DRISDOL) 50000 units CAPS capsule Take 50,000 Units by mouth every 30 (thirty) days.   Yes [provider]  Amino Acids-Protein Hydrolys (FEEDING SUPPLEMENT, PRO-STAT SUGAR FREE 64,) LIQD Take 30 mLs by mouth 2 (two) times daily. 02/11/16   Gouru, Illene Silver, MD  clonazePAM (KLONOPIN) 0.5 MG tablet Take 1 tablet (0.5 mg total) by mouth at bedtime. Patient not taking: Reported on 05/06/2017 02/11/16   Nicholes Mango, MD  linezolid (ZYVOX) 600 MG tablet Take 1 tablet (600 mg total) by mouth 2 (two) times daily. Patient not taking: Reported on 05/06/2017 02/11/16   Nicholes Mango, MD   Allergies  Allergen Reactions  . Nsaids Other (See Comments)    Reaction:  Unknown   . Tramadol Itching    FAMILY HISTORY:  family history includes Colon cancer in her brother; Heart attack in her father. SOCIAL HISTORY:  reports that she quit smoking about 23 years ago. She smoked 0.25 packs per day. She has never used smokeless tobacco. She reports that she does not drink alcohol or use drugs.  REVIEW OF SYSTEMS:  Positives in BOLD  Constitutional: Negative for fever, chills, weight loss, malaise/fatigue and diaphoresis.  HENT: Negative for hearing loss, ear pain, nosebleeds, congestion, sore throat, neck pain, tinnitus and ear discharge.   Eyes: Negative for blurred vision, double vision, photophobia, pain, discharge and redness.  Respiratory: Negative for cough, hemoptysis, sputum production, shortness of breath, wheezing and stridor.   Cardiovascular: Negative for chest pain, palpitations, orthopnea, claudication, leg swelling and PND.  Gastrointestinal: Negative for heartburn, nausea, vomiting, abdominal pain, diarrhea, constipation, blood in stool and melena.  Genitourinary: Negative for dysuria, urgency, frequency, hematuria and flank pain.  Musculoskeletal: Negative for myalgias, back pain, joint pain and falls.  Skin: Negative for itching and rash.    Neurological: Negative for dizziness, tingling, tremors, sensory change, speech change, focal weakness, seizures, loss of consciousness, weakness and headaches.  Endo/Heme/Allergies: Negative for environmental allergies and polydipsia. Does not bruise/bleed easily.  SUBJECTIVE: Unable to obtain as the patient is on BiPAP and tachypnic  VITAL SIGNS: Temp:  [98.3 F (36.8 C)] 98.3 F (36.8 C) (07/05 1549) Pulse Rate:  [68-69] 68 (07/05 1630) Resp:  [16-17] 16 (07/05 1630) BP: (138-147)/(66-101) 147/101 (07/05 1630) SpO2:  [98 %-99 %] 98 % (07/05 1630) Weight:  [153.4 kg (338 lb 3 oz)] 153.4 kg (338 lb 3 oz) (07/05 1549)  PHYSICAL EXAMINATION: General: Elderly,caucasian morbidly obese female, on BiPAP Neuro:  Awake,Alert and oriented HEENT:  AT,[place],No JVD Cardiovascular:  S1S2,Regular,nom/r/g noted Lungs:  Crackles on left, diminished air entry on right, no wheezes,rhonchi noted Abdomen:  Soft,NT,ND,+BS Musculoskeletal:  No edema/cyanosis noted Skin:  Warm,dry and intact   Recent Labs Lab 05/06/17 1553  NA 140  K 4.9  CL 101  CO2 34*  BUN 22*  CREATININE 0.96  GLUCOSE 113*    Recent Labs Lab 05/06/17 1553  HGB 10.5*  HCT 33.2*  WBC 11.2*  PLT 271   Dg Chest Portable 1 View  Result Date: 05/06/2017 CLINICAL DATA:  Shortness of breath.  Altered mental status. EXAM: PORTABLE CHEST 1 VIEW COMPARISON:  February 09, 2016 FINDINGS: No pneumothorax. Interstitial opacities diffusely on the right. More focal opacity in left retrocardiac region. No other interval changes. IMPRESSION: 1. Diffuse interstitial opacities on the right may represent asymmetric edema or developing infiltrate. More focal infiltrate is seen in the left base, similar since the previous study. Electronically Signed   By: Dorise Bullion III M.D   On: 05/06/2017 17:21    ASSESSMENT / PLAN: Acute on chronic hypercapnic respiratory failure secondary to Pulmonary edema Vs PNA Acute encephalopathy secondary to  hypercapnia  H/O COPD Hx of Hypothyroidism UTI P:  Continuous Bipap for now wean as tolerated Maintain O2 sats >92% Bronchodilator therapy Repeat cxr in am  BNP pending Prn ABG's Continue Meropenem/vancomycin ID consulted appreciate input  Trend WBC and monitor fever curve Trend PCT's and lactic acid  Follow cultures  Lovenox for VTE prophylaxis Monitor for s/sx of bleeding  Trend CBC  Continue Synthroid Rest per primary     Khiana Camino,AG-ACNP Pulmonary & Critical Care

## 2017-05-07 ENCOUNTER — Inpatient Hospital Stay: Payer: Medicare Other

## 2017-05-07 DIAGNOSIS — R4182 Altered mental status, unspecified: Secondary | ICD-10-CM

## 2017-05-07 LAB — BLOOD GAS, ARTERIAL
ACID-BASE EXCESS: 8.3 mmol/L — AB (ref 0.0–2.0)
Acid-Base Excess: 7.9 mmol/L — ABNORMAL HIGH (ref 0.0–2.0)
BICARBONATE: 35 mmol/L — AB (ref 20.0–28.0)
Bicarbonate: 37.7 mmol/L — ABNORMAL HIGH (ref 20.0–28.0)
Expiratory PAP: 6
FIO2: 0.4
FIO2: 0.35
INSPIRATORY PAP: 16
MODE: POSITIVE
Mechanical Rate: 16
O2 SAT: 93.9 %
O2 Saturation: 93.1 %
PATIENT TEMPERATURE: 37
PH ART: 7.25 — AB (ref 7.350–7.450)
Patient temperature: 37
pCO2 arterial: 86 mmHg (ref 32.0–48.0)
pCO2 arterial: 62 mmHg — ABNORMAL HIGH (ref 32.0–48.0)
pH, Arterial: 7.36 (ref 7.350–7.450)
pO2, Arterial: 81 mmHg — ABNORMAL LOW (ref 83.0–108.0)
pO2, Arterial: 70 mmHg — ABNORMAL LOW (ref 83.0–108.0)

## 2017-05-07 LAB — GLUCOSE, CAPILLARY
Glucose-Capillary: 68 mg/dL (ref 65–99)
Glucose-Capillary: 63 mg/dL — ABNORMAL LOW (ref 65–99)
Glucose-Capillary: 106 mg/dL — ABNORMAL HIGH (ref 65–99)

## 2017-05-07 LAB — CBC
HCT: 30.7 % — ABNORMAL LOW (ref 35.0–47.0)
HEMOGLOBIN: 9.8 g/dL — AB (ref 12.0–16.0)
MCH: 32.3 pg (ref 26.0–34.0)
MCHC: 32 g/dL (ref 32.0–36.0)
MCV: 100.9 fL — AB (ref 80.0–100.0)
Platelets: 205 10*3/uL (ref 150–440)
RBC: 3.04 MIL/uL — AB (ref 3.80–5.20)
RDW: 14.5 % (ref 11.5–14.5)
WBC: 10.9 10*3/uL (ref 3.6–11.0)

## 2017-05-07 MED ORDER — ENOXAPARIN SODIUM 40 MG/0.4ML ~~LOC~~ SOLN
40.0000 mg | Freq: Two times a day (BID) | SUBCUTANEOUS | Status: DC
  Administered 2017-05-07 – 2017-05-11 (×9): 40 mg via SUBCUTANEOUS
  Filled 2017-05-07 (×9): qty 0.4

## 2017-05-07 MED ORDER — METHYLPREDNISOLONE SODIUM SUCC 125 MG IJ SOLR
60.0000 mg | Freq: Four times a day (QID) | INTRAMUSCULAR | Status: DC
  Administered 2017-05-07 – 2017-05-08 (×4): 60 mg via INTRAVENOUS
  Filled 2017-05-07 (×4): qty 2

## 2017-05-07 MED ORDER — DEXTROSE 50 % IV SOLN
INTRAVENOUS | Status: AC
  Filled 2017-05-07: qty 50

## 2017-05-07 MED ORDER — DEXTROSE 50 % IV SOLN
25.0000 mL | INTRAVENOUS | Status: AC
  Administered 2017-05-07: 25 mL via INTRAVENOUS

## 2017-05-07 MED ORDER — VANCOMYCIN HCL IN DEXTROSE 1-5 GM/200ML-% IV SOLN
1000.0000 mg | Freq: Two times a day (BID) | INTRAVENOUS | Status: DC
  Administered 2017-05-07 – 2017-05-08 (×4): 1000 mg via INTRAVENOUS
  Filled 2017-05-07 (×6): qty 200

## 2017-05-07 MED ORDER — DEXTROSE-NACL 5-0.45 % IV SOLN
INTRAVENOUS | Status: DC
  Administered 2017-05-07: 75 mL/h via INTRAVENOUS
  Administered 2017-05-07: 12:00:00 via INTRAVENOUS

## 2017-05-07 NOTE — Plan of Care (Signed)
Problem: Safety: Goal: Ability to remain free from injury will improve Outcome: Progressing Patient remained free from injury this shift- no new injuries noted  Problem: Health Behavior/Discharge Planning: Goal: Ability to manage health-related needs will improve Outcome: Progressing Patient taken off Bipap and placed on Hi-Flo nasal Cannula; patient tolerating well at this time. Patient more alert to voice and appearing less drowsy this afternoon. Patient's family in to visit with patient this afternoon, patient interacting and responding appropriately  Problem: Pain Managment: Goal: General experience of comfort will improve Outcome: Progressing Patient on Bariatric bed that reposition at specific intervals for comfort and for pressure redistribution. No requests for pain medicine this shift.   Problem: Physical Regulation: Goal: Will remain free from infection Outcome: Progressing Patient seen by I & D MD this afternoon. Antibiotics being administered to treat present infection

## 2017-05-07 NOTE — Progress Notes (Signed)
Patient tolerated sips of water well. Has remained awake since start of shift. Was able to take PO tylenol for complaints of headache without any difficulty.

## 2017-05-07 NOTE — Progress Notes (Signed)
[place] [facility] [place] NAME: Kathy Guzman    MR#:  [id number]  DATE OF BIRTH:  1934/12/28  SUBJECTIVE:seen at bedside.admitted for AMS ,found to have hypercrbic respi failure,UTI,off BIPAP,alert ,but according to ICU RN,fluctuation in alertness,so she is still npo.  CHIEF COMPLAINT:   Chief Complaint  Patient presents with  . Altered Mental Status    REVIEW OF SYSTEMS:   Review of Systems  Unable to perform ROS: Medical condition   She was alert during my visit,c/o leg pain.want to drink water.  DRUG ALLERGIES:   Allergies  Allergen Reactions  . Nsaids Other (See Comments)    Reaction:  Unknown   . Tramadol Itching    VITALS:  Blood pressure (!) 117/59, pulse 83, temperature 97.9 F (36.6 C), temperature source Oral, resp. rate (!) 22, height 5\' 2"  (1.575 m), weight (!) 152.1 kg (335 lb 5.1 oz), SpO2 98 %.  PHYSICAL EXAMINATION:  GENERAL:  82 y.o.-year-old patient lying in the bed with no acute distress,bipap is just removed. EYES: Pupils equal, round, reactive to light . No scleral icterus. Extraocular muscles intact.  HEENT: Head atraumatic, normocephalic. Oropharynx and nasopharynx clear.  NECK:  Supple, no jugular venous distention. No thyroid enlargement, no tenderness.  LUNGS: Normal breath sounds bilaterally, no wheezing, rales,rhonchi or crepitation. No use of accessory muscles of respiration.  CARDIOVASCULAR: S1, S2 normal. No murmurs, rubs, or gallops.  ABDOMEN: Soft, nontender, nondistended. Bowel sounds present. No organomegaly or mass.  EXTREMITIES: No pedal edema, cyanosis, or clubbing.  NEUROLOGIC: unable to full neuro exam due to confusion. PSYCHIATRIC: The patient is alert and awake at times. SKIN: No obvious rash, lesion, or ulcer.    LABORATORY PANEL:   CBC  Recent Labs Lab 05/07/17 0329  WBC 10.9  HGB 9.8*  HCT 30.7*  PLT 205    ------------------------------------------------------------------------------------------------------------------  Chemistries   Recent Labs Lab 05/06/17 1553  NA 140  K 4.9  CL 101  CO2 34*  GLUCOSE 113*  BUN 22*  CREATININE 0.96  CALCIUM 9.4  AST 28  ALT 14  ALKPHOS 105  BILITOT 0.8   ------------------------------------------------------------------------------------------------------------------  Cardiac Enzymes No results for input(s): TROPONINI in the last 168 hours. ------------------------------------------------------------------------------------------------------------------  RADIOLOGY:  Dg Chest Port 1 View  Result Date: 05/07/2017 CLINICAL DATA:  Shortness of breath EXAM: PORTABLE CHEST 1 VIEW COMPARISON:  Chest radiograph 05/06/2017 FINDINGS: Unchanged cardiomegaly and calcific aortic atherosclerosis. Left basilar consolidation and right basilar opacities is unchanged. IMPRESSION: Unchanged appearance of the chest with left basilar consolidation, cardiomegaly and right basilar opacities. Electronically Signed   By: Ulyses Jarred M.D.   On: 05/07/2017 05:52   Dg Chest Portable 1 View  Result Date: 05/06/2017 CLINICAL DATA:  Shortness of breath.  Altered mental status. EXAM: PORTABLE CHEST 1 VIEW COMPARISON:  February 09, 2016 FINDINGS: No pneumothorax. Interstitial opacities diffusely on the right. More focal opacity in left retrocardiac region. No other interval changes. IMPRESSION: 1. Diffuse interstitial opacities on the right may represent asymmetric edema or developing infiltrate. More focal infiltrate is seen in the left base, similar since the previous study. Electronically Signed   By: Dorise Bullion III M.D   On: 05/06/2017 17:21    EKG:   Orders placed or performed during the hospital encounter of 05/06/17  . EKG 12-Lead  . EKG 12-Lead    ASSESSMENT AND PLAN:  hyper capneic respiratory failure ,lef lower lobe pneumonia;on bipap,iv abx,rpt ABG  am.clinically slowly improving alertness  Continue IV steroids,nebs 2.hypoglycemia this am;now  On IV d51/2 ns,continue fluids while NPO. 3.recurrent UTI;MRSA screen positive,continue vanco,cefipime,follow urine cultures.,folowed by ID  4.GERD 5.gout HLP hypothyroid D/w daughter ,RN   records are reviewed and case discussed with Care Management/Social Workerr. Management plans discussed with the patient, family and they are in agreement.  CODE STATUS: DNR  TOTAL TIME TAKING CARE OF THIS PATIENT:35  POSSIBLE D/C IN 1-2 DAYS, DEPENDING ON CLINICAL CONDITION.   Epifanio Lesches M.D on 05/07/2017 at 4:40 PM  Between 7am to 6pm - Pager - [phone]  After 6pm go to [email or web address] - password [email or web address] [facility]  Office  [phone]  CC: Primary care physician; Leonel Ramsay, MD   Note: This dictation was prepared with Dragon dictation along with smaller phrase technology. Any transcriptional errors that result from this process are unintentional.

## 2017-05-07 NOTE — Care Management (Addendum)
Patient admitted to icu stepdown due to need for continuous bipap due to elevated PCO2.  Being treated for pneumonia and continued IV antibiotics for uti.  She presents from [facility]. and was being treated with IV antibiotics.  ID consult pending.  Chronic 02 and bipap at facility

## 2017-05-07 NOTE — Consult Note (Signed)
[facility] Infectious Disease     Reason for Consult:Sepsis   Referring Physician: Serita Grit Date of Admission:  05/06/2017   Active Problems:   Respiratory failure ([place])   Urinary tract infection without hematuria   SOB (shortness of breath)   HPI: Kathy Guzman is a 82 y.o. female admitted with AMS and resp distress. She had been ill for a week.  She was  on ertapenen at snf for UTI but continued to decline.  On admit wbc 11, no fevers.  UA TNTC wbc.  Was in hypercarbic resp failure.  CXR possible PNA. Is in ICU. BCX UCX pending. Family at bedside    Past Medical History:  Diagnosis Date  . Anxiety   . CHF (congestive heart failure) ([place])   . COPD (chronic obstructive pulmonary disease) ([place])   . Depression   . GERD (gastroesophageal reflux disease)   . Gout   . Hyperlipidemia   . Hypertension   . Hypothyroid   . Osteoarthritis   . Recurrent UTI   . Skin cancer   . Urethral stricture    Past Surgical History:  Procedure Laterality Date  . CHOLECYSTECTOMY    . PARTIAL HYSTERECTOMY     Social History  Substance Use Topics  . Smoking status: Former Smoker    Packs/day: 0.25    Quit date: 03/18/1994  . Smokeless tobacco: Never Used  . Alcohol use No   Family History  Problem Relation Age of Onset  . Colon cancer Brother   . Heart attack Father     Allergies:  Allergies  Allergen Reactions  . Nsaids Other (See Comments)    Reaction:  Unknown   . Tramadol Itching    Current antibiotics: Antibiotics Given (last 72 hours)    Date/Time Action Medication Dose Rate   05/06/17 1813 New Bag/Given   vancomycin (VANCOCIN) 1,500 mg in sodium chloride 0.9 % 500 mL IVPB 1,500 mg 250 mL/hr   05/06/17 2134 New Bag/Given   meropenem (MERREM) 1 g in sodium chloride 0.9 % 100 mL IVPB 1 g 200 mL/hr   05/07/17 0646 New Bag/Given   meropenem (MERREM) 1 g in sodium chloride 0.9 % 100 mL IVPB 1 g 200 mL/hr   05/07/17 1151 New Bag/Given   vancomycin (VANCOCIN) IVPB 1000  mg/200 mL premix 1,000 mg 200 mL/hr   05/07/17 1414 New Bag/Given   meropenem (MERREM) 1 g in sodium chloride 0.9 % 100 mL IVPB 1 g 200 mL/hr      MEDICATIONS: . acetaminophen  1,300 mg Oral BID  . chlorhexidine  15 mL Mouth Rinse BID  . enoxaparin (LOVENOX) injection  40 mg Subcutaneous Q12H  . fluticasone  2 spray Each Nare Daily  . ipratropium-albuterol  3 mL Nebulization Q6H  . levothyroxine  75 mcg Oral QAC breakfast  . mouth rinse  15 mL Mouth Rinse q12n4p  . methylPREDNISolone (SOLU-MEDROL) injection  60 mg Intravenous Q6H  . Vitamin D (Ergocalciferol)  50,000 Units Oral Q30 days    Review of Systems - unable to obtain  OBJECTIVE: Temp:  [97.9 F (36.6 C)-98.4 F (36.9 C)] 97.9 F (36.6 C) (07/06 1400) Pulse Rate:  [68-84] 83 (07/06 1400) Resp:  [14-25] 22 (07/06 1400) BP: (88-147)/(34-113) 117/59 (07/06 1400) SpO2:  [80 %-100 %] 98 % (07/06 1400) FiO2 (%):  [30 %-40 %] 35 % (07/06 0800) Weight:  [152.1 kg (335 lb 5.1 oz)-153.4 kg (338 lb 3 oz)] 152.1 kg (335 lb 5.1 oz) (07/05 2137)  Physical Exam  Constitutional:  Morbidly obese >300#,  she is awake and interactive HENT: [place]/AT, PERRLA, no scleral icterus Mouth/Throat: Oropharynx is clear and moist. No oropharyngeal exudate.  Cardiovascular:distant  Pulmonary/Chest: poor air movement, wheeze Neck = supple, no nuchal rigidity Abdominal: Soft. Bowel sounds are normal.  exhibits no distension. There is no tenderness.  Lymphadenopathy: no cervical adenopathy. No axillary adenopathy Neurological: alert and interacitve.  Skin: some bruising Psychiatric: a normal mood and affect.  behavior is normal.    LABS: Results for orders placed or performed during the hospital encounter of 05/06/17 (from the past 48 hour(s))  Comprehensive metabolic panel     Status: Abnormal   Collection Time: 05/06/17  3:53 PM  Result Value Ref Range   Sodium 140 135 - 145 mmol/L   Potassium 4.9 3.5 - 5.1 mmol/L    Comment: HEMOLYSIS AT  THIS LEVEL MAY AFFECT RESULT   Chloride 101 101 - 111 mmol/L   CO2 34 (H) 22 - 32 mmol/L   Glucose, Bld 113 (H) 65 - 99 mg/dL   BUN 22 (H) 6 - 20 mg/dL   Creatinine, Ser 0.96 0.44 - 1.00 mg/dL   Calcium 9.4 8.9 - 10.3 mg/dL   Total Protein 7.2 6.5 - 8.1 g/dL   Albumin 3.2 (L) 3.5 - 5.0 g/dL   AST 28 15 - 41 U/L    Comment: HEMOLYSIS AT THIS LEVEL MAY AFFECT RESULT   ALT 14 14 - 54 U/L   Alkaline Phosphatase 105 38 - 126 U/L   Total Bilirubin 0.8 0.3 - 1.2 mg/dL    Comment: HEMOLYSIS AT THIS LEVEL MAY AFFECT RESULT   GFR calc non Af Amer 55 (L) >60 mL/min   GFR calc Af Amer >60 >60 mL/min    Comment: (NOTE) The eGFR has been calculated using the CKD EPI equation. This calculation has not been validated in all clinical situations. eGFR's persistently <60 mL/min signify possible Chronic Kidney Disease.    Anion gap 5 5 - 15  CBC     Status: Abnormal   Collection Time: 05/06/17  3:53 PM  Result Value Ref Range   WBC 11.2 (H) 3.6 - 11.0 K/uL   RBC 3.28 (L) 3.80 - 5.20 MIL/uL   Hemoglobin 10.5 (L) 12.0 - 16.0 g/dL   HCT 33.2 (L) 35.0 - 47.0 %   MCV 101.3 (H) 80.0 - 100.0 fL   MCH 32.1 26.0 - 34.0 pg   MCHC 31.7 (L) 32.0 - 36.0 g/dL   RDW 14.8 (H) 11.5 - 14.5 %   Platelets 271 150 - 440 K/uL  Brain natriuretic peptide     Status: Abnormal   Collection Time: 05/06/17  3:53 PM  Result Value Ref Range   B Natriuretic Peptide 234.0 (H) 0.0 - 100.0 pg/mL  Urinalysis, Complete w Microscopic     Status: Abnormal   Collection Time: 05/06/17  4:10 PM  Result Value Ref Range   Color, Urine YELLOW (A) YELLOW   APPearance CLOUDY (A) CLEAR   Specific Gravity, Urine 1.006 1.005 - 1.030   pH 5.0 5.0 - 8.0   Glucose, UA NEGATIVE NEGATIVE mg/dL   Hgb urine dipstick SMALL (A) NEGATIVE   Bilirubin Urine NEGATIVE NEGATIVE   Ketones, ur NEGATIVE NEGATIVE mg/dL   Protein, ur NEGATIVE NEGATIVE mg/dL   Nitrite NEGATIVE NEGATIVE   Leukocytes, UA LARGE (A) NEGATIVE   RBC / HPF 6-30 0 - 5 RBC/hpf    WBC, UA TOO NUMEROUS TO COUNT 0 -  5 WBC/hpf   Bacteria, UA RARE (A) NONE SEEN   Squamous Epithelial / LPF 0-5 (A) NONE SEEN   WBC Clumps PRESENT   Blood gas, arterial     Status: Abnormal   Collection Time: 05/06/17  4:51 PM  Result Value Ref Range   FIO2 0.28    Delivery systems NASAL CANNULA    pH, Arterial 7.27 (L) 7.350 - 7.450   pCO2 arterial 84 (HH) 32.0 - 48.0 mmHg    Comment: CRITICAL RESULT CALLED TO, READ BACK BY AND VERIFIED WITH:  DR [facility] @ 1712 ON 05/06/2017 BY SNM    pO2, Arterial 80 (L) 83.0 - 108.0 mmHg   Bicarbonate 38.6 (H) 20.0 - 28.0 mmol/L   Acid-Base Excess 9.1 (H) 0.0 - 2.0 mmol/L   O2 Saturation 94.0 %   Patient temperature 37.0    Collection site LEFT RADIAL    Sample type ARTERIAL DRAW    Allens test (pass/fail) PASS PASS  Culture, blood (routine x 2)     Status: None (Preliminary result)   Collection Time: 05/06/17  5:56 PM  Result Value Ref Range   Specimen Description BLOOD LAC    Special Requests      BOTTLES DRAWN AEROBIC AND ANAEROBIC Blood Culture adequate volume   Culture NO GROWTH < 12 HOURS    Report Status PENDING   Culture, blood (routine x 2)     Status: None (Preliminary result)   Collection Time: 05/06/17  5:56 PM  Result Value Ref Range   Specimen Description BLOOD RFA    Special Requests      BOTTLES DRAWN AEROBIC AND ANAEROBIC Blood Culture adequate volume   Culture NO GROWTH < 12 HOURS    Report Status PENDING   Blood gas, arterial     Status: Abnormal   Collection Time: 05/06/17  6:10 PM  Result Value Ref Range   FIO2 0.40    Delivery systems BILEVEL POSITIVE AIRWAY PRESSURE    Inspiratory PAP 12    Expiratory PAP 6    pH, Arterial 7.26 (L) 7.350 - 7.450   pCO2 arterial 84 (HH) 32.0 - 48.0 mmHg    Comment: CRITICAL RESULT CALLED TO, READ BACK BY AND VERIFIED WITH: DR [facility] @ 1836 ON 05/06/17 BY SNM    pO2, Arterial 98 83.0 - 108.0 mmHg   Bicarbonate 37.7 (H) 20.0 - 28.0 mmol/L   Acid-Base Excess 8.1 (H) 0.0 - 2.0  mmol/L   O2 Saturation 96.6 %   Patient temperature 37.0    Collection site LEFT RADIAL    Sample type ARTHROGRAPHIS SPECIES    Allens test (pass/fail) PASS PASS  MRSA PCR Screening     Status: Abnormal   Collection Time: 05/06/17  9:28 PM  Result Value Ref Range   MRSA by PCR POSITIVE (A) NEGATIVE    Comment:        The GeneXpert MRSA Assay (FDA approved for NASAL specimens only), is one component of a comprehensive MRSA colonization surveillance program. It is not intended to diagnose MRSA infection nor to guide or monitor treatment for MRSA infections. RESULT CALLED TO, READ BACK BY AND VERIFIED WITH: TESS THOMAS AT 2315 ON 05/06/17 RWW   Glucose, capillary     Status: None   Collection Time: 05/06/17  9:29 PM  Result Value Ref Range   Glucose-Capillary 68 65 - 99 mg/dL  Glucose, capillary     Status: None   Collection Time: 05/06/17 10:07 PM  Result Value Ref Range  Glucose-Capillary 89 65 - 99 mg/dL  CBC     Status: Abnormal   Collection Time: 05/07/17  3:29 AM  Result Value Ref Range   WBC 10.9 3.6 - 11.0 K/uL   RBC 3.04 (L) 3.80 - 5.20 MIL/uL   Hemoglobin 9.8 (L) 12.0 - 16.0 g/dL   HCT 30.7 (L) 35.0 - 47.0 %   MCV 100.9 (H) 80.0 - 100.0 fL   MCH 32.3 26.0 - 34.0 pg   MCHC 32.0 32.0 - 36.0 g/dL   RDW 14.5 11.5 - 14.5 %   Platelets 205 150 - 440 K/uL  Blood gas, arterial     Status: Abnormal   Collection Time: 05/07/17  5:00 AM  Result Value Ref Range   FIO2 0.40    Delivery systems VENTILATOR    Mode BILEVEL POSITIVE AIRWAY PRESSURE    Inspiratory PAP 16    Expiratory PAP 6    pH, Arterial 7.25 (L) 7.350 - 7.450   pCO2 arterial 86 (HH) 32.0 - 48.0 mmHg   pO2, Arterial 81 (L) 83.0 - 108.0 mmHg   Bicarbonate 37.7 (H) 20.0 - 28.0 mmol/L   Acid-Base Excess 8.3 (H) 0.0 - 2.0 mmol/L   O2 Saturation 93.9 %   Patient temperature 37.0    Collection site RIGHT RADIAL    Sample type ARTERIAL DRAW    Allens test (pass/fail) PASS PASS   Mechanical Rate 16    Glucose, capillary     Status: Abnormal   Collection Time: 05/07/17  7:57 AM  Result Value Ref Range   Glucose-Capillary 63 (L) 65 - 99 mg/dL  Glucose, capillary     Status: Abnormal   Collection Time: 05/07/17  8:54 AM  Result Value Ref Range   Glucose-Capillary 106 (H) 65 - 99 mg/dL   No components found for: ESR, C REACTIVE PROTEIN MICRO: Recent Results (from the past 720 hour(s))  Culture, blood (routine x 2)     Status: None (Preliminary result)   Collection Time: 05/06/17  5:56 PM  Result Value Ref Range Status   Specimen Description BLOOD LAC  Final   Special Requests   Final    BOTTLES DRAWN AEROBIC AND ANAEROBIC Blood Culture adequate volume   Culture NO GROWTH < 12 HOURS  Final   Report Status PENDING  Incomplete  Culture, blood (routine x 2)     Status: None (Preliminary result)   Collection Time: 05/06/17  5:56 PM  Result Value Ref Range Status   Specimen Description BLOOD RFA  Final   Special Requests   Final    BOTTLES DRAWN AEROBIC AND ANAEROBIC Blood Culture adequate volume   Culture NO GROWTH < 12 HOURS  Final   Report Status PENDING  Incomplete  MRSA PCR Screening     Status: Abnormal   Collection Time: 05/06/17  9:28 PM  Result Value Ref Range Status   MRSA by PCR POSITIVE (A) NEGATIVE Final    Comment:        The GeneXpert MRSA Assay (FDA approved for NASAL specimens only), is one component of a comprehensive MRSA colonization surveillance program. It is not intended to diagnose MRSA infection nor to guide or monitor treatment for MRSA infections. RESULT CALLED TO, READ BACK BY AND VERIFIED WITH: TESS THOMAS AT 2315 ON 05/06/17 RWW     IMAGING: Dg Chest Port 1 View  Result Date: 05/07/2017 CLINICAL DATA:  Shortness of breath EXAM: PORTABLE CHEST 1 VIEW COMPARISON:  Chest radiograph 05/06/2017 FINDINGS: Unchanged cardiomegaly and  calcific aortic atherosclerosis. Left basilar consolidation and right basilar opacities is unchanged. IMPRESSION:  Unchanged appearance of the chest with left basilar consolidation, cardiomegaly and right basilar opacities. Electronically Signed   By: Ulyses Jarred M.D.   On: 05/07/2017 05:52   Dg Chest Portable 1 View  Result Date: 05/06/2017 CLINICAL DATA:  Shortness of breath.  Altered mental status. EXAM: PORTABLE CHEST 1 VIEW COMPARISON:  February 09, 2016 FINDINGS: No pneumothorax. Interstitial opacities diffusely on the right. More focal opacity in left retrocardiac region. No other interval changes. IMPRESSION: 1. Diffuse interstitial opacities on the right may represent asymmetric edema or developing infiltrate. More focal infiltrate is seen in the left base, similar since the previous study. Electronically Signed   By: Dorise Bullion III M.D   On: 05/06/2017 17:21    Assessment:   DELCIA SPITZLEY is a 82 y.o. female with morbid obesity admitted with AMS, UTI, resp failure. Was on ertepenem at [facility]. Cx pending. Seems to be improving. Possible Pna on cxr.  Recommendations Cont vanco and meropenem pending cultures Check sputum cx if possible Thank you very much for allowing me to participate in the care of this patient. Please call with questions.   Cheral Marker. Ola Spurr, MD

## 2017-05-07 NOTE — Progress Notes (Signed)
Pharmacy Anticoagulation Note  82 y/o F ordered Lovenox 40 mg daily for DVT prophylaxis.   Estimated Creatinine Clearance: 68.2 mL/min (by C-G formula based on SCr of 0.96 mg/dL).  Filed Weights   05/06/17 1549 05/06/17 2137  Weight: (!) 338 lb 3 oz (153.4 kg) (!) 335 lb 5.1 oz (152.1 kg)   Body mass index is 61.33 kg/m.  Will increase Lovenox dosing to 40 mg bid with weight > 100 kg and BMI > 40.   Ulice Dash, PharmD Clinical Pharmacist

## 2017-05-07 NOTE — NC FL2 (Signed)
[place] LEVEL OF CARE SCREENING TOOL     IDENTIFICATION  Patient Name: Kathy Guzman Birthdate: Feb 11, 1935 Sex: female Admission Date (Current Location): 05/06/2017  [place] and [place] Number:  Engineering geologist and Address:  [facility], [address], [place],  [address]      Provider Number: [id number]  Attending Physician Name and Address:  Epifanio Lesches, MD  Relative Name and Phone Number:       Current Level of Care: Hospital Recommended Level of Care: [place] Prior Approval Number:    Date Approved/Denied:   PASRR Number: [id number]  Discharge Plan: SNF    Current Diagnoses: Patient Active Problem List   Diagnosis Date Noted  . Respiratory failure ([place]) 05/06/2017  . Urinary tract infection without hematuria   . SOB (shortness of breath)   . Chronic respiratory failure ([place]) 02/24/2016  . Pressure ulcer 02/08/2016  . Sepsis ([place]) 02/07/2016  . Acute encephalopathy 01/10/2016  . Delirium 01/07/2016  . Acute cystitis without hematuria 01/07/2016  . Hyperkalemia 01/07/2016  . Hyponatremia 01/07/2016  . Anemia 01/07/2016  . Morbid obesity ([place]) 01/07/2016  . Body aches 01/07/2016  . Chronic diastolic heart failure ([place]) 03/19/2015  . COPD (chronic obstructive pulmonary disease) ([place]) 03/19/2015    Orientation RESPIRATION BLADDER Height & Weight     Self, Time, Place  Normal, O2 (2 liters) Incontinent Weight: (!) 335 lb 5.1 oz (152.1 kg) Height:  5\' 2"  (157.5 cm)  BEHAVIORAL SYMPTOMS/MOOD NEUROLOGICAL BOWEL NUTRITION STATUS   (none)   Continent Diet (currently npo but to be advanced)  AMBULATORY STATUS COMMUNICATION OF NEEDS Skin   Extensive Assist Verbally Normal                       Personal Care Assistance Level of Assistance  Dressing, Bathing Bathing Assistance: Maximum assistance   Dressing Assistance: Maximum assistance Total Care Assistance: Maximum  assistance   Functional Limitations Info   (none)          SPECIAL CARE FACTORS FREQUENCY                       Contractures Contractures Info: Not present    Additional Factors Info  Code Status, Isolation Precautions Code Status Info: dnr             Current Medications (05/07/2017):  This is the current hospital active medication list Current Facility-Administered Medications  Medication Dose Route Frequency Provider Last Rate Last Dose  . acetaminophen (TYLENOL) tablet 650 mg  650 mg Oral Q6H PRN Dustin Flock, MD       Or  . acetaminophen (TYLENOL) suppository 650 mg  650 mg Rectal Q6H PRN Dustin Flock, MD      . acetaminophen (TYLENOL) tablet 1,300 mg  1,300 mg Oral BID Epifanio Lesches, MD      . albuterol (PROVENTIL) (2.5 MG/3ML) 0.083% nebulizer solution 2.5 mg  2.5 mg Inhalation Q4H PRN Dustin Flock, MD      . alum & mag hydroxide-simeth (MAALOX/MYLANTA) 200-200-20 MG/5ML suspension 30 mL  30 mL Oral Q4H PRN Dustin Flock, MD      . chlorhexidine (PERIDEX) 0.12 % solution 15 mL  15 mL Mouth Rinse BID Varughese, Bincy S, NP      . dextrose 5 %-0.45 % sodium chloride infusion   Intravenous Continuous Awilda Bill, NP 75 mL/hr at 05/07/17 1151    . enoxaparin (LOVENOX) injection 40  mg  40 mg Subcutaneous Q12H Napoleon Form, RPH   40 mg at 05/07/17 1410  . fluticasone (FLONASE) 50 MCG/ACT nasal spray 2 spray  2 spray Each Nare Daily Dustin Flock, MD      . ipratropium-albuterol (DUONEB) 0.5-2.5 (3) MG/3ML nebulizer solution 3 mL  3 mL Nebulization Q6H Dustin Flock, MD   3 mL at 05/07/17 1324  . levothyroxine (SYNTHROID, LEVOTHROID) tablet 75 mcg  75 mcg Oral QAC breakfast Dustin Flock, MD      . MEDLINE mouth rinse  15 mL Mouth Rinse q12n4p Varughese, Bincy S, NP   15 mL at 05/07/17 1410  . meropenem (MERREM) 1 g in sodium chloride 0.9 % 100 mL IVPB  1 g Intravenous Q8H Hinda Kehr, MD 200 mL/hr at 05/07/17 1414 1 g at 05/07/17 1414   . methylPREDNISolone sodium succinate (SOLU-MEDROL) 125 mg/2 mL injection 60 mg  60 mg Intravenous Q6H Conforti, John, DO   60 mg at 05/07/17 1151  . ondansetron (ZOFRAN) tablet 4 mg  4 mg Oral Q6H PRN Dustin Flock, MD       Or  . ondansetron [facility]) injection 4 mg  4 mg Intravenous Q6H PRN Dustin Flock, MD      . vancomycin (VANCOCIN) IVPB 1000 mg/200 mL premix  1,000 mg Intravenous Q12H Napoleon Form, [facility]   Stopped at 05/07/17 1251  . Vitamin D (Ergocalciferol) (DRISDOL) capsule 50,000 Units  50,000 Units Oral Q30 days Dustin Flock, MD         Discharge Medications: Please see discharge summary for a list of discharge medications.  Relevant Imaging Results:  Relevant Lab Results:   Additional Information    Shela Leff, LCSW

## 2017-05-07 NOTE — Progress Notes (Signed)
[place] for Vancomycin and Meropenem  Indication: pneumonia  Allergies  Allergen Reactions  . Nsaids Other (See Comments)    Reaction:  Unknown   . Tramadol Itching    Patient Measurements: Height: 5\' 2"  (157.5 cm) Weight: (!) 335 lb 5.1 oz (152.1 kg) IBW/kg (Calculated) : 50.1  Vital Signs: Temp: 98.4 F (36.9 C) (07/06 0800) Temp Source: Axillary (07/06 0800) BP: 127/45 (07/06 0600) Pulse Rate: 77 (07/06 0600) Intake/Output from previous day: 07/05 0701 - 07/06 0700 In: 100 [IV Piggyback:100] Out: -  Intake/Output from this shift: No intake/output data recorded.  Labs:  Recent Labs  05/06/17 1553 05/07/17 0329  WBC 11.2* 10.9  HGB 10.5* 9.8*  PLT 271 205  CREATININE 0.96  --    Estimated Creatinine Clearance: 68.2 mL/min (by C-G formula based on SCr of 0.96 mg/dL). No results for input(s): VANCOTROUGH, VANCOPEAK, VANCORANDOM, GENTTROUGH, GENTPEAK, GENTRANDOM, TOBRATROUGH, TOBRAPEAK, TOBRARND, AMIKACINPEAK, AMIKACINTROU, AMIKACIN in the last 72 hours.   Microbiology: Recent Results (from the past 720 hour(s))  Culture, blood (routine x 2)     Status: None (Preliminary result)   Collection Time: 05/06/17  5:56 PM  Result Value Ref Range Status   Specimen Description BLOOD LAC  Final   Special Requests   Final    BOTTLES DRAWN AEROBIC AND ANAEROBIC Blood Culture adequate volume   Culture NO GROWTH < 12 HOURS  Final   Report Status PENDING  Incomplete  Culture, blood (routine x 2)     Status: None (Preliminary result)   Collection Time: 05/06/17  5:56 PM  Result Value Ref Range Status   Specimen Description BLOOD RFA  Final   Special Requests   Final    BOTTLES DRAWN AEROBIC AND ANAEROBIC Blood Culture adequate volume   Culture NO GROWTH < 12 HOURS  Final   Report Status PENDING  Incomplete  MRSA PCR Screening     Status: Abnormal   Collection Time: 05/06/17  9:28 PM  Result Value Ref Range Status   MRSA by PCR POSITIVE  (A) NEGATIVE Final    Comment:        The GeneXpert MRSA Assay (FDA approved for NASAL specimens only), is one component of a comprehensive MRSA colonization surveillance program. It is not intended to diagnose MRSA infection nor to guide or monitor treatment for MRSA infections. RESULT CALLED TO, READ BACK BY AND VERIFIED WITH: TESS THOMAS AT 2315 ON 05/06/17 RWW     Medical History: Past Medical History:  Diagnosis Date  . Anxiety   . CHF (congestive heart failure) ([place])   . COPD (chronic obstructive pulmonary disease) ([place])   . Depression   . GERD (gastroesophageal reflux disease)   . Gout   . Hyperlipidemia   . Hypertension   . Hypothyroid   . Osteoarthritis   . Recurrent UTI   . Skin cancer   . Urethral stricture    Assessment: 82 y/o F with recent treatment for UTI with IV antibiotics admitted with respiratory failure, suspected PNA. Patient is ordered meropenem and vancomycin empirically.   Goal of Therapy:  Vancomycin trough level 15-20 mcg/ml  Plan:  Vancomycin 1500 mg iv x 1 given 7/5. Will begin vancomycin 1000 mg iv q 24 hours and tentatively plan on checking a trough with the 4th dose.   Ulice Dash D 05/07/2017,1:10 PM

## 2017-05-07 NOTE — Progress Notes (Signed)
Pt's bipap setting adjusted per abg results. Pt currently on 17/5 RR 16 40%.

## 2017-05-07 NOTE — Progress Notes (Signed)
Pnt currently asleep and appears comfortable. Bipap in place. Pnt has been moved to room 11 and placed on a bariatric bed. Pnt denies any pain or discomfort at this time in the shift. No s/sx of resp distress this shift. Pnt easily awakened, calm alert and oriented x3. pnt disoriented to situation. Pnt MRSA positive, contact isolation initiated. No other issues or concerns. Bed low and locked, call bell in reach. Will continue to monitor and assess.

## 2017-05-08 LAB — BASIC METABOLIC PANEL
ANION GAP: 7 (ref 5–15)
Anion gap: 8 (ref 5–15)
BUN: 21 mg/dL — ABNORMAL HIGH (ref 6–20)
BUN: 23 mg/dL — AB (ref 6–20)
CALCIUM: 9.2 mg/dL (ref 8.9–10.3)
CO2: 30 mmol/L (ref 22–32)
CO2: 29 mmol/L (ref 22–32)
Calcium: 9 mg/dL (ref 8.9–10.3)
Chloride: 102 mmol/L (ref 101–111)
Chloride: 101 mmol/L (ref 101–111)
Creatinine, Ser: 1 mg/dL (ref 0.44–1.00)
Creatinine, Ser: 0.99 mg/dL (ref 0.44–1.00)
GFR calc Af Amer: >60 mL/min (ref >60)
GFR calc non Af Amer: 52 mL/min — ABNORMAL LOW (ref >60)
GFR, EST NON AFRICAN AMERICAN: 53 mL/min — AB (ref >60)
GLUCOSE: 178 mg/dL — AB (ref 65–99)
Glucose, Bld: 175 mg/dL — ABNORMAL HIGH (ref 65–99)
POTASSIUM: 4 mmol/L (ref 3.5–5.1)
Potassium: 4.4 mmol/L (ref 3.5–5.1)
SODIUM: 138 mmol/L (ref 135–145)
Sodium: 139 mmol/L (ref 135–145)

## 2017-05-08 LAB — CBC
HEMATOCRIT: 31 % — AB (ref 35.0–47.0)
HEMOGLOBIN: 10.1 g/dL — AB (ref 12.0–16.0)
MCH: 32.9 pg (ref 26.0–34.0)
MCHC: 32.5 g/dL (ref 32.0–36.0)
MCV: 101 fL — AB (ref 80.0–100.0)
Platelets: 183 10*3/uL (ref 150–440)
RBC: 3.07 MIL/uL — AB (ref 3.80–5.20)
RDW: 14.5 % (ref 11.5–14.5)
WBC: 5.5 10*3/uL (ref 3.6–11.0)

## 2017-05-08 LAB — URINE CULTURE
Culture: NO GROWTH
SPECIAL REQUESTS: NORMAL

## 2017-05-08 LAB — C DIFFICILE QUICK SCREEN W PCR REFLEX
C DIFFICLE (CDIFF) ANTIGEN: POSITIVE — AB
C Diff toxin: NEGATIVE

## 2017-05-08 LAB — CLOSTRIDIUM DIFFICILE BY PCR: CDIFFPCR: POSITIVE — AB

## 2017-05-08 LAB — MAGNESIUM: Magnesium: 1.4 mg/dL — ABNORMAL LOW (ref 1.7–2.4)

## 2017-05-08 LAB — PHOSPHORUS: PHOSPHORUS: 2.5 mg/dL (ref 2.5–4.6)

## 2017-05-08 MED ORDER — MAGNESIUM SULFATE 2 GM/50ML IV SOLN
INTRAVENOUS | Status: AC
  Filled 2017-05-08: qty 50

## 2017-05-08 MED ORDER — MAGNESIUM SULFATE 2 GM/50ML IV SOLN
2.0000 g | Freq: Once | INTRAVENOUS | Status: AC
  Administered 2017-05-08: 2 g via INTRAVENOUS

## 2017-05-08 MED ORDER — VANCOMYCIN 50 MG/ML ORAL SOLUTION
125.0000 mg | Freq: Four times a day (QID) | ORAL | Status: DC
  Administered 2017-05-08 – 2017-05-16 (×31): 125 mg via ORAL
  Filled 2017-05-08 (×33): qty 2.5

## 2017-05-08 MED ORDER — METHYLPREDNISOLONE SODIUM SUCC 125 MG IJ SOLR
60.0000 mg | Freq: Two times a day (BID) | INTRAMUSCULAR | Status: DC
  Administered 2017-05-08 – 2017-05-10 (×4): 60 mg via INTRAVENOUS
  Filled 2017-05-08 (×4): qty 2

## 2017-05-08 MED ORDER — ORAL CARE MOUTH RINSE
15.0000 mL | Freq: Two times a day (BID) | OROMUCOSAL | Status: DC
  Administered 2017-05-08 – 2017-05-16 (×9): 15 mL via OROMUCOSAL

## 2017-05-08 NOTE — Progress Notes (Signed)
[place] at [place] NAME: Kathy Guzman    MR#:  [id number]  DATE OF BIRTH:  [birth year]  SUBJECTIVE:  seen at bedside, she is alert , awake oriented. Of the BiPAP since last night. Says she wants to be discharged as her [birth year] is coming on [birth year].   CHIEF COMPLAINT:   Chief Complaint  Patient presents with  . Altered Mental Status    REVIEW OF SYSTEMS:   Review of Systems  Constitutional: Negative for chills and fever.  HENT: Negative for hearing loss.   Eyes: Negative for blurred vision, double vision and photophobia.  Respiratory: Negative for cough, hemoptysis and shortness of breath.   Cardiovascular: Negative for palpitations, orthopnea and leg swelling.  Gastrointestinal: Negative for abdominal pain, diarrhea and vomiting.  Genitourinary: Negative for dysuria and urgency.  Musculoskeletal: Negative for myalgias and neck pain.  Skin: Negative for rash.  Neurological: Negative for dizziness, focal weakness, seizures, weakness and headaches.  Psychiatric/Behavioral: Negative for memory loss. The patient does not have insomnia.      DRUG ALLERGIES:   Allergies  Allergen Reactions  . Nsaids Other (See Comments)    Reaction:  Unknown   . Tramadol Itching    VITALS:  Blood pressure 98/71, pulse 89, temperature 98.9 F (37.2 C), temperature source Oral, resp. rate 20, height 5\' 2"  (1.575 m), weight (!) 152.1 kg (335 lb 5.1 oz), SpO2 97 %.  PHYSICAL EXAMINATION:  GENERAL:  82 y.o.-year-old patient lying in the bed with no acute distress,morbidlly obese. EYES: Pupils equal, round, reactive to light . No scleral icterus. Extraocular muscles intact.  HEENT: Head atraumatic, normocephalic. Oropharynx and nasopharynx clear.  NECK:  Supple, no jugular venous distention. No thyroid enlargement, no tenderness.  LUNGS: Normal breath sounds bilaterally, no wheezing, rales,rhonchi or crepitation. No use of accessory muscles of  respiration.  CARDIOVASCULAR: S1, S2 normal. No murmurs, rubs, or gallops.  ABDOMEN: Soft, nontender, nondistended. Bowel sounds present. No organomegaly or mass.  EXTREMITIES: No pedal edema, cyanosis, or clubbing.  NEUROLOGIC: unable to full neuro exam due to confusion. PSYCHIATRIC: The patient is alert and awake at times. SKIN: No obvious rash, lesion, or ulcer.    LABORATORY PANEL:   CBC  Recent Labs Lab 05/08/17 0423  WBC 5.5  HGB 10.1*  HCT 31.0*  PLT 183   ------------------------------------------------------------------------------------------------------------------  Chemistries   Recent Labs Lab 05/06/17 1553 05/08/17 0423  NA 140 139  K 4.9 4.4  CL 101 102  CO2 34* 30  GLUCOSE 113* 178*  BUN 22* 21*  CREATININE 0.96 1.00  CALCIUM 9.4 9.0  MG  --  1.4*  AST 28  --   ALT 14  --   ALKPHOS 105  --   BILITOT 0.8  --    ------------------------------------------------------------------------------------------------------------------  Cardiac Enzymes No results for input(s): TROPONINI in the last 168 hours. ------------------------------------------------------------------------------------------------------------------  RADIOLOGY:  Dg Chest Port 1 View  Result Date: 05/07/2017 CLINICAL DATA:  Shortness of breath EXAM: PORTABLE CHEST 1 VIEW COMPARISON:  Chest radiograph 05/06/2017 FINDINGS: Unchanged cardiomegaly and calcific aortic atherosclerosis. Left basilar consolidation and right basilar opacities is unchanged. IMPRESSION: Unchanged appearance of the chest with left basilar consolidation, cardiomegaly and right basilar opacities. Electronically Signed   By: Ulyses Jarred M.D.   On: 05/07/2017 05:52   Dg Chest Portable 1 View  Result Date: 05/06/2017 CLINICAL DATA:  Shortness of breath.  Altered mental status. EXAM: PORTABLE CHEST 1 VIEW COMPARISON:  February 09, 2016 FINDINGS: No pneumothorax. Interstitial opacities diffusely on the right. More focal  opacity in left retrocardiac region. No other interval changes. IMPRESSION: 1. Diffuse interstitial opacities on the right may represent asymmetric edema or developing infiltrate. More focal infiltrate is seen in the left base, similar since the previous study. Electronically Signed   By: Dorise Bullion III M.D   On: 05/06/2017 17:21    EKG:   Orders placed or performed during the hospital encounter of 05/06/17  . EKG 12-Lead  . EKG 12-Lead    ASSESSMENT AND PLAN:  hyper capneic respiratory failure ,lef lower lobe pneumonia;Continue antibiotics, off the BiPAP, transfer to floor. Continue oxygen to keep sats 88-90%.    2. Hypoglycemia resolved, discontinue IV fluids. Encourage by mouth intake. Start the patient on diet.  3.recurrent UTI;MRSA screen positive,continue vanco,cefipime,follow urine cultures.,folowed by ID continue contact precautions.,   4.GERD  5.gout  HLP  Hypothyroid  Bed bound for 3 years as per daughter   D/w daughter ,RN   records are reviewed and case discussed with Care Management/Social Workerr. Management plans discussed with the patient, family and they are in agreement.  CODE STATUS: DNR  TOTAL TIME TAKING CARE OF THIS PATIENT:35  POSSIBLE D/C IN 1-2 DAYS, DEPENDING ON CLINICAL CONDITION.   Epifanio Lesches M.D on 05/08/2017 at 10:37 AM  Between 7am to 6pm - Pager - [phone]  After 6pm go to [email or web address] - password [email or web address] [facility]  Office  [phone]  CC: Primary care physician; Leonel Ramsay, MD   Note: This dictation was prepared with Dragon dictation along with smaller phrase technology. Any transcriptional errors that result from this process are unintentional.

## 2017-05-08 NOTE — Progress Notes (Signed)
Pt transported to room 159 in stable condition. All belongings with patient. Patient family at bedside. Fanny Skates, RN at bedside.

## 2017-05-08 NOTE — Progress Notes (Signed)
Pts results positive for C diff. MD Vachhani notified. MD placing orders for antibiotics. Pt on Enetric precautions.

## 2017-05-08 NOTE — Progress Notes (Signed)
[name]    SYNOPSIS   This is a 81 yo female with a PMH of Urethral Stricture, Skin Cancer, Recurrent UTI, Osteoarthritis, Hypothyroidism, HTN, Hyperlipidemia, Gout, GERD, Depression, COPD, CHF, and Anxiety. She presented to [facility] ER 07/5 from [facility] with increased lethargy and confusion. Per ER notes the pts family reported the pt was "acting different than normal," however upon EMS arrival the pt was alert and oriented. In the ER the pt was complaining of left shoulder pain, however she stated she did not injure herself. The family reported the pt is currently on day 51 of iv antibiotic treatment due to diagnosis of a UTI. Prior to EMS arrival she was given 40 mg IV lasix at [facility]. In the ER abg revealed pH 7.27 and pCO2 84, therefore she was placed on continuous Bipap and admitted by hospitalist team to the [facility] Unit for further workup and treatment PCCM consulted.    ASSESSMENT/PLAN   Acute on chronic hypercapnic respiratory failure. Multifactorial etiology to include pulmonary edema versus pneumonia, obesity hypoventilation syndrome, COPD with underlying history of hypothyroidism on replacement. Presently being weaned to nasal cannula at several liters. Is presently on albuterol, Atrovent, budesonide, Solu-Medrol, meropenem, vancomycin. Stable for floor transfer   VENTILATOR SETTINGS: FiO2 (%):  [30 %-35 %] 30 %  INTAKE / OUTPUT:  Intake/Output Summary (Last 24 hours) at 05/08/17 0859 Last data filed at 05/08/17 0839  Gross per 24 hour  Intake          2741.25 ml  Output                0 ml  Net          2741.25 ml   Name: Kathy Guzman MRN: [id number] DOB: [birth year]    ADMISSION DATE:  05/06/2017   SUBJECTIVE:   Kathy Guzman feels that she is doing much better this morning. Awake and alert, has been weaned off of BiPAP presently on he did high flow. She is oriented, without any distress  Review of  Systems:  Constitutional: Feels well. Cardiovascular: No chest pain.  Pulmonary: Denies dyspnea.   10 point review of systems was negative  other than what is documented in the HPI.    VITAL SIGNS: Temp:  [97.9 F (36.6 C)-99.9 F (37.7 C)] 98.9 F (37.2 C) (07/07 0800) Pulse Rate:  [72-89] 86 (07/07 0800) Resp:  [14-25] 21 (07/07 0800) BP: (98-139)/(36-71) 98/71 (07/07 0800) SpO2:  [91 %-100 %] 99 % (07/07 0800) FiO2 (%):  [30 %-35 %] 30 % (07/07 0139)   PHYSICAL EXAMINATION: Physical Examination:   VS: BP 98/71 (BP Location: Right Leg)   Pulse 86   Temp 98.9 F (37.2 C) (Oral)   Resp (!) 21   Ht 5\' 2"  (1.575 m)   Wt (!) 152.1 kg (335 lb 5.1 oz)   SpO2 99%   BMI 61.33 kg/m   General Appearance: No distress  Pulmonary: Diminished breath sounds but clear CardiovascularNormal S1,S2.  No m/r/g.   Abdomen: Massive obesity noted, positive bowel sounds, generalized soft exam Skin:   warm, no rashes, no ecchymosis  Extremities: Massive lower extremity edema Massive obesity    LABORATORY PANEL:   CBC  Recent Labs Lab 05/08/17 0423  WBC 5.5  HGB 10.1*  HCT 31.0*  PLT 183    Chemistries   Recent Labs Lab 05/06/17 1553 05/08/17 0423  NA 140 139  K 4.9 4.4  CL 101 102  CO2 34* 30  GLUCOSE 113* 178*  BUN 22* 21*  CREATININE 0.96 1.00  CALCIUM 9.4 9.0  MG  --  1.4*  PHOS  --  2.5  AST 28  --   ALT 14  --   ALKPHOS 105  --   BILITOT 0.8  --      Recent Labs Lab 05/06/17 2129 05/06/17 2207 05/07/17 0757 05/07/17 0854  GLUCAP 68 89 63* 106*    Recent Labs Lab 05/06/17 1810 05/07/17 0500 05/07/17 2017  PHART 7.26* 7.25* 7.36  PCO2ART 84* 86* 62*  PO2ART 98 81* 70*    Recent Labs Lab 05/06/17 1553  AST 28  ALT 14  ALKPHOS 105  BILITOT 0.8  ALBUMIN 3.2*    Cardiac Enzymes No results for input(s): TROPONINI in the last 168 hours.  RADIOLOGY:  Dg Chest Port 1 View  Result Date: 05/07/2017 CLINICAL DATA:  Shortness of breath  EXAM: PORTABLE CHEST 1 VIEW COMPARISON:  Chest radiograph 05/06/2017 FINDINGS: Unchanged cardiomegaly and calcific aortic atherosclerosis. Left basilar consolidation and right basilar opacities is unchanged. IMPRESSION: Unchanged appearance of the chest with left basilar consolidation, cardiomegaly and right basilar opacities. Electronically Signed   By: Ulyses Jarred M.D.   On: 05/07/2017 05:52   Dg Chest Portable 1 View  Result Date: 05/06/2017 CLINICAL DATA:  Shortness of breath.  Altered mental status. EXAM: PORTABLE CHEST 1 VIEW COMPARISON:  February 09, 2016 FINDINGS: No pneumothorax. Interstitial opacities diffusely on the right. More focal opacity in left retrocardiac region. No other interval changes. IMPRESSION: 1. Diffuse interstitial opacities on the right may represent asymmetric edema or developing infiltrate. More focal infiltrate is seen in the left base, similar since the previous study. Electronically Signed   By: Dorise Bullion III M.D   On: 05/06/2017 17:21    Hermelinda Dellen, DO ICU Pager: [phone] [facility] Office Number: [phone]   05/08/2017

## 2017-05-08 NOTE — Clinical Social Work Note (Signed)
Clinical Social Work Assessment  Patient Details  Name: Kathy Guzman MRN: [id number] Date of Birth: January 16, 1935  Date of referral:  05/08/17               Reason for consult:  Facility Placement                Permission sought to share information with:  Chartered certified accountant granted to share information::  Yes, Verbal Permission Granted  Name::        Agency::  [facility]  Relationship::     Contact Information:     Housing/Transportation Living arrangements for the past 2 months:  [place] City of Information:  Patient, [place], Adult Children Patient Interpreter Needed:  None Criminal Activity/Legal Involvement Pertinent to Current Situation/Hospitalization:  No - Comment as needed Significant Relationships:  Adult Children, Warehouse manager Lives with:  Facility Resident Do you feel safe going back to the place where you live?  Yes Need for family participation in patient care:  No (Coment)  Care giving concerns:  Patient admitted from SNF ([facility])   Social Worker assessment / plan:  The CSW met with her patient and her family at bedside to discuss CSW role in discharge planning. The patient's daughter Kathy Guzman indicated that the patient does not have a legal guardian, but that Kathy Guzman is the [place]. Kathy Guzman verbalized permission to contact [facility] and indicated that the patient will return there when stable via non-emergent EMS. The patient seems to be hard of hearing, but is alert and oriented.   At baseline, the patient has been bed bound for at least 3 years. The patient often spoke of family members "kicking [her] out of their homes", but she did admit that she needed more care than they could provide her at home.   Discharge date is unknown at this time as the patient transferred out of CCU today. The patient has a large family who have all visited and are supportive.  Employment status:   Retired Nurse, adult PT Recommendations:  Not assessed at this time Information / Referral to community resources:     Patient/Family's Response to care:  The patient and family thanked the CSW for assistance.  Patient/Family's Understanding of and Emotional Response to Diagnosis, Current Treatment, and Prognosis:  The patient and family are in agreement with the patient returning to [facility] when stable.  Emotional Assessment Appearance:  Appears younger than stated age Attitude/Demeanor/Rapport:   (Pleasant) Affect (typically observed):  Appropriate, Pleasant Orientation:  Oriented to Self, Oriented to Place, Oriented to  Time, Oriented to Situation Alcohol / Substance use:  Never Used Psych involvement (Current and /or in the community):  No (Comment)  Discharge Needs  Concerns to be addressed:  Care Coordination, Discharge Planning Concerns Readmission within the last 30 days:  No Current discharge risk:  Chronically ill Barriers to Discharge:  Continued Medical Work up   [employer], LCSW 05/08/2017, 3:45 PM

## 2017-05-08 NOTE — Progress Notes (Signed)
Patient was no longer tolerating CPAP machine. She was yelling out, making noises with the machine and wanted it off. CPAP taken off at this time per patients insistence and HFNC applied. Will notify respiratory and NP.

## 2017-05-08 NOTE — Progress Notes (Signed)
Pt having several loose stools, stool specimen sent to lab.   Bethann Punches

## 2017-05-08 NOTE — Progress Notes (Signed)
Vancomycin dose administered at 2025. Trough drawn at 2100. Lab asked to cancel vanc trough. Will reenter trough for before next dose.  Jaskaran Dauzat A. Banks, [place].D., BCPS Clinical Pharmacist 05/08/17 2143

## 2017-05-09 LAB — POTASSIUM: POTASSIUM: 4.2 mmol/L (ref 3.5–5.1)

## 2017-05-09 LAB — MAGNESIUM: Magnesium: 1.9 mg/dL (ref 1.7–2.4)

## 2017-05-09 MED ORDER — RISAQUAD PO CAPS
2.0000 | ORAL_CAPSULE | Freq: Three times a day (TID) | ORAL | Status: DC
  Administered 2017-05-09 – 2017-05-16 (×22): 2 via ORAL
  Filled 2017-05-09 (×22): qty 2

## 2017-05-09 NOTE — Progress Notes (Signed)
[place] at [place] NAME: Kathy Guzman    MR#:  [id number]  DATE OF BIRTH:  [birth year]  SUBJECTIVE:  Patien  Has diarrhea. No shortness of breath. Denies any other complaints. Stool C. difficile is positive..   CHIEF COMPLAINT:   Chief Complaint  Patient presents with  . Altered Mental Status    REVIEW OF SYSTEMS:   Review of Systems  Constitutional: Negative for chills and fever.  HENT: Negative for hearing loss.   Eyes: Negative for blurred vision, double vision and photophobia.  Respiratory: Positive for cough. Negative for hemoptysis and shortness of breath.   Cardiovascular: Negative for palpitations, orthopnea and leg swelling.  Gastrointestinal: Positive for diarrhea. Negative for abdominal pain and vomiting.  Genitourinary: Negative for dysuria and urgency.  Musculoskeletal: Negative for myalgias and neck pain.  Skin: Negative for rash.  Neurological: Negative for dizziness, focal weakness, seizures, weakness and headaches.  Psychiatric/Behavioral: Negative for memory loss. The patient does not have insomnia.      DRUG ALLERGIES:   Allergies  Allergen Reactions  . Nsaids Other (See Comments)    Reaction:  Unknown   . Tramadol Itching    VITALS:  Blood pressure (!) 154/75, pulse 98, temperature 98.1 F (36.7 C), temperature source Oral, resp. rate (!) 22, height 5\' 2"  (1.575 m), weight (!) 152.1 kg (335 lb 5.1 oz), SpO2 96 %.  PHYSICAL EXAMINATION:  GENERAL:  82 y.o.-year-old patient lying in the bed with no acute distress,morbidlly obese. EYES: Pupils equal, round, reactive to light . No scleral icterus. Extraocular muscles intact.  HEENT: Head atraumatic, normocephalic. Oropharynx and nasopharynx clear.  NECK:  Supple, no jugular venous distention. No thyroid enlargement, no tenderness.  LUNGS: Decreased breath sounds at bases., no wheezing, rales,rhonchi or crepitation. No use of accessory muscles of  respiration.  CARDIOVASCULAR: S1, S2 normal. No murmurs, rubs, or gallops.  ABDOMEN: Soft, nontender, nondistended. Bowel sounds present. No organomegaly or mass.  EXTREMITIES: No pedal edema, cyanosis, or clubbing.  NEUROLOGIC: Alert, awake, oriented, no focal deficit.  PSYCHIATRIC: The patient is alert , awake, oriented. SKIN: No obvious rash, lesion, or ulcer.    LABORATORY PANEL:   CBC  Recent Labs Lab 05/08/17 0423  WBC 5.5  HGB 10.1*  HCT 31.0*  PLT 183   ------------------------------------------------------------------------------------------------------------------  Chemistries   Recent Labs Lab 05/06/17 1553 05/08/17 0423 05/08/17 2100  NA 140 139 138  K 4.9 4.4 4.0  CL 101 102 101  CO2 34* 30 29  GLUCOSE 113* 178* 175*  BUN 22* 21* 23*  CREATININE 0.96 1.00 0.99  CALCIUM 9.4 9.0 9.2  MG  --  1.4*  --   AST 28  --   --   ALT 14  --   --   ALKPHOS 105  --   --   BILITOT 0.8  --   --    ------------------------------------------------------------------------------------------------------------------  Cardiac Enzymes No results for input(s): TROPONINI in the last 168 hours. ------------------------------------------------------------------------------------------------------------------  RADIOLOGY:  No results found.  EKG:   Orders placed or performed during the hospital encounter of 05/06/17  . EKG 12-Lead  . EKG 12-Lead    ASSESSMENT AND PLAN:  hyper capneic respiratory failure ,lef lower lobe pneumonia;Continue antibiotics, off the BiPAP, continue new oxygen, continue antibiotics.   2. Hypoglycemia resolved, discontinue IV fluids. Encourage by mouth intake. Start the patient on diet.  3.recurrent UTI;MRSA screen positive,continue , continue cefepime.  4.GERD; continue PPIs:   5.gout  6. Diarrhea, C. difficile colitis. Continue by mouth vancomycin. Add probiotics.  Hypothyroid; and TIMI level thyroxine.  Bed bound for 3 years as per  daughter   D/w daughter ,RN   records are reviewed and case discussed with Care Management/Social Workerr. Management plans discussed with the patient, family and they are in agreement.  CODE STATUS: DNR  TOTAL TIME TAKING CARE OF THIS PATIENT:35  POSSIBLE D/C IN 1-2 DAYS, DEPENDING ON CLINICAL CONDITION.   Epifanio Lesches M.D on 05/09/2017 at 10:16 AM  Between 7am to 6pm - Pager - [phone]  After 6pm go to [email or web address] - password [email or web address] [facility]  Office  [phone]  CC: Primary care physician; Leonel Ramsay, MD   Note: This dictation was prepared with Dragon dictation along with smaller phrase technology. Any transcriptional errors that result from this process are unintentional.

## 2017-05-10 MED ORDER — IPRATROPIUM-ALBUTEROL 0.5-2.5 (3) MG/3ML IN SOLN
3.0000 mL | Freq: Three times a day (TID) | RESPIRATORY_TRACT | Status: DC
  Administered 2017-05-11 – 2017-05-15 (×13): 3 mL via RESPIRATORY_TRACT
  Filled 2017-05-10 (×13): qty 3

## 2017-05-10 MED ORDER — DILTIAZEM HCL 25 MG/5ML IV SOLN
5.0000 mg | Freq: Once | INTRAVENOUS | Status: AC
  Administered 2017-05-10: 5 mg via INTRAVENOUS
  Filled 2017-05-10: qty 5

## 2017-05-10 MED ORDER — METHYLPREDNISOLONE SODIUM SUCC 40 MG IJ SOLR
40.0000 mg | Freq: Two times a day (BID) | INTRAMUSCULAR | Status: DC
  Administered 2017-05-10 – 2017-05-12 (×5): 40 mg via INTRAVENOUS
  Filled 2017-05-10 (×6): qty 1

## 2017-05-10 MED ORDER — DILTIAZEM HCL ER COATED BEADS 120 MG PO CP24
240.0000 mg | ORAL_CAPSULE | Freq: Every day | ORAL | Status: DC
  Administered 2017-05-10 – 2017-05-16 (×7): 240 mg via ORAL
  Filled 2017-05-10 (×7): qty 2

## 2017-05-10 MED ORDER — METOPROLOL TARTRATE 5 MG/5ML IV SOLN
5.0000 mg | Freq: Once | INTRAVENOUS | Status: AC
  Administered 2017-05-10: 5 mg via INTRAVENOUS

## 2017-05-10 MED ORDER — DEXTROSE 5 % IV SOLN
5.0000 mg/h | INTRAVENOUS | Status: DC
  Administered 2017-05-10: 12.5 mg/h via INTRAVENOUS
  Administered 2017-05-10: 5 mg/h via INTRAVENOUS
  Administered 2017-05-10: 12.5 mg/h via INTRAVENOUS
  Administered 2017-05-10: 10 mg/h via INTRAVENOUS
  Administered 2017-05-10 – 2017-05-11 (×2): 12.5 mg/h via INTRAVENOUS
  Filled 2017-05-10 (×4): qty 100

## 2017-05-10 MED ORDER — METOPROLOL TARTRATE 5 MG/5ML IV SOLN
INTRAVENOUS | Status: AC
  Administered 2017-05-10: 03:00:00
  Filled 2017-05-10: qty 5

## 2017-05-10 MED ORDER — CHLORHEXIDINE GLUCONATE CLOTH 2 % EX PADS
6.0000 | MEDICATED_PAD | Freq: Every day | CUTANEOUS | Status: AC
  Administered 2017-05-11 – 2017-05-15 (×5): 6 via TOPICAL

## 2017-05-10 MED ORDER — DILTIAZEM HCL 30 MG PO TABS: 60.0000 mg | ORAL_TABLET | Freq: Three times a day (TID) | ORAL | Status: DC

## 2017-05-10 MED ORDER — SODIUM CHLORIDE 0.9 % IV SOLN
3.0000 g | Freq: Four times a day (QID) | INTRAVENOUS | Status: DC
  Administered 2017-05-10 – 2017-05-11 (×4): 3 g via INTRAVENOUS
  Filled 2017-05-10 (×7): qty 3

## 2017-05-10 NOTE — Plan of Care (Signed)
Problem: Safety: Goal: Ability to remain free from injury will improve Outcome: Not Progressing Pt is bedbound and will remain free from injury while admitted to this unit. Pt encouraged to call out for assistance when needed.

## 2017-05-10 NOTE — Consult Note (Signed)
[facility] Consultation Note  Patient ID: Kathy Guzman, MRN: [id number], DOB/AGE: 80-Nov-1936 80 y.o. Admit date: 05/06/2017   Date of Consult: 05/10/2017 Primary Physician: Leonel Ramsay, MD Primary Cardiologist: None  Chief Complaint:  Chief Complaint  Patient presents with  . Altered Mental Status   Reason for Consult: atrial fibrillation  HPI: 82 y.o. female with the known chronic obstructive pulmonary disease severe lung disease with sleep apnea and hypoxia essential hypertension with mixed hyperlipidemia having recurrent episodes of infection with sepsis and anemia and recently now in recovery from this sepsis. At that time the patient is a has now had new onset of atrial fibrillation with rapid ventricular rate but no evidence of significant side effects or symptoms at this time. The patient was placed on diltiazem drip with reasonable heart rate control 220 bpm down from 130 bpm. She has not had any diastolic dysfunction heart failure or other side effects at this time. She does not use a CPAP machine at home but does take oxygen. The patient will need further treatment of this atrial fibrillation and as well treating her risk of stroke with atrial fibrillation with a CHA DS score of 3  Past Medical History:  Diagnosis Date  . Anxiety   . CHF (congestive heart failure) ([place])   . COPD (chronic obstructive pulmonary disease) ([place])   . Depression   . GERD (gastroesophageal reflux disease)   . Gout   . Hyperlipidemia   . Hypertension   . Hypothyroid   . Osteoarthritis   . Recurrent UTI   . Skin cancer   . Urethral stricture       Surgical History:  Past Surgical History:  Procedure Laterality Date  . CHOLECYSTECTOMY    . PARTIAL HYSTERECTOMY       Home Meds: Prior to Admission medications   Medication Sig Start Date End Date Taking? Authorizing Provider  acetaminophen (TYLENOL) 325 MG tablet Take 650 mg by mouth every 4 (four) hours as needed.   Yes  [provider]  acetaminophen (TYLENOL) 650 MG CR tablet Take 1,300 mg by mouth 2 (two) times daily.   Yes [provider]  albuterol (PROVENTIL HFA;VENTOLIN HFA) 108 (90 BASE) MCG/ACT inhaler Inhale 2 puffs into the lungs every 4 (four) hours as needed for wheezing or shortness of breath.   Yes [provider]  allopurinol (ZYLOPRIM) 100 MG tablet Take 100 mg by mouth daily.    Yes [provider]  alum & mag hydroxide-simeth (MAALOX/MYLANTA) 200-200-20 MG/5ML suspension Take 30 mLs by mouth every 4 (four) hours as needed for indigestion or heartburn.   Yes [provider]  bisacodyl (DULCOLAX) 5 MG EC tablet Take 1 tablet (5 mg total) by mouth daily as needed for moderate constipation. 02/11/16  Yes Gouru, Illene Silver, MD  budesonide-formoterol (SYMBICORT) 160-4.5 MCG/ACT inhaler Inhale 2 puffs into the lungs 2 (two) times daily.   Yes [provider]  calcium carbonate (TUMS) 500 MG chewable tablet Chew 1 tablet by mouth every 4 (four) hours as needed for indigestion or heartburn.   Yes [provider]  carvedilol (COREG) 6.25 MG tablet Take 6.25 mg by mouth 2 (two) times daily with a meal.   Yes [provider]  Cranberry-Vitamin C-Inulin (UTI-STAT) LIQD Take 30 mLs by mouth daily.   Yes [provider]  cyanocobalamin 500 MCG tablet Take 500 mcg by mouth 2 (two) times daily.   Yes [provider]  docusate sodium (COLACE) 100  MG capsule Take 1 capsule (100 mg total) by mouth 2 (two) times daily. 02/11/16  Yes Gouru, Illene Silver, MD  ferrous sulfate 325 (65 FE) MG tablet Take 325 mg by mouth 2 (two) times daily with a meal.    Yes [provider]  fluticasone (FLONASE) 50 MCG/ACT nasal spray Place 2 sprays into both nostrils daily. 01/13/16  Yes Dustin Flock, MD  furosemide (LASIX) 20 MG tablet Take 2 tablets (40 mg total) by mouth daily. Patient taking differently: Take 60 mg by mouth daily.  01/13/16  Yes  Dustin Flock, MD  gabapentin (NEURONTIN) 100 MG capsule Take 200 mg by mouth 2 (two) times daily.    Yes [provider]  guaiFENesin-dextromethorphan (ROBITUSSIN DM) 100-10 MG/5ML syrup Take 10 mLs by mouth every 4 (four) hours as needed for cough.   Yes [provider]  hydrOXYzine (ATARAX/VISTARIL) 25 MG tablet Take 25 mg by mouth every 6 (six) hours as needed.   Yes [provider]  ipratropium-albuterol (DUONEB) 0.5-2.5 (3) MG/3ML SOLN Take 3 mLs by nebulization every 6 (six) hours. 01/13/16  Yes Dustin Flock, MD  levothyroxine (SYNTHROID, LEVOTHROID) 75 MCG tablet Take 75 mcg by mouth daily before breakfast.   Yes [provider]  nystatin cream (MYCOSTATIN) Apply 1 application topically every 8 (eight) hours as needed (for yeast). Pt applies to all skin folds.   Yes [provider]  omeprazole (PRILOSEC) 20 MG capsule Take 20 mg by mouth 2 (two) times daily before a meal.    Yes [provider]  PARoxetine (PAXIL) 30 MG tablet Take 30 mg by mouth daily.   Yes [provider]  QUEtiapine (SEROQUEL) 25 MG tablet Take 25 mg by mouth at bedtime.    Yes [provider]  saccharomyces boulardii (FLORASTOR) 250 MG capsule Take 250 mg by mouth 2 (two) times daily. 04/28/17 05/28/17 Yes [provider]  Skin Protectants, Misc. (CALAZIME SKIN PROTECTANT EX) Apply 1 application topically 4 (four) times daily as needed (for rash). Pt applies to buttocks.   Yes [provider]  valsartan (DIOVAN) 80 MG tablet Take 80 mg by mouth daily.    Yes [provider]  Vitamin D, Ergocalciferol, (DRISDOL) 50000 units CAPS capsule Take 50,000 Units by mouth every 30 (thirty) days.   Yes [provider]  Amino Acids-Protein Hydrolys (FEEDING SUPPLEMENT, PRO-STAT SUGAR FREE 64,) LIQD Take 30 mLs by mouth 2 (two) times daily. 02/11/16   Gouru, Illene Silver, MD  clonazePAM (KLONOPIN) 0.5 MG tablet Take 1 tablet (0.5 mg  total) by mouth at bedtime. Patient not taking: Reported on 05/06/2017 02/11/16   Nicholes Mango, MD  linezolid (ZYVOX) 600 MG tablet Take 1 tablet (600 mg total) by mouth 2 (two) times daily. Patient not taking: Reported on 05/06/2017 02/11/16   Nicholes Mango, MD    Inpatient Medications:  . acetaminophen  1,300 mg Oral BID  . acidophilus  2 capsule Oral TID  . chlorhexidine  15 mL Mouth Rinse BID  . diltiazem  240 mg Oral Daily  . enoxaparin (LOVENOX) injection  40 mg Subcutaneous Q12H  . fluticasone  2 spray Each Nare Daily  . ipratropium-albuterol  3 mL Nebulization Q6H  . levothyroxine  75 mcg Oral QAC breakfast  . mouth rinse  15 mL Mouth Rinse q12n4p  . mouth rinse  15 mL Mouth Rinse BID  . methylPREDNISolone (SOLU-MEDROL) injection  40 mg Intravenous Q12H  . vancomycin  125 mg Oral QID  . Vitamin D (  Ergocalciferol)  50,000 Units Oral Q30 days   . diltiazem (CARDIZEM) infusion 12.5 mg/hr (05/10/17 1020)  . meropenem (MERREM) IV Stopped (05/10/17 2094)    Allergies:  Allergies  Allergen Reactions  . Nsaids Other (See Comments)    Reaction:  Unknown   . Tramadol Itching    Social History   Social History  . Marital status: Divorced    Spouse name: N/A  . Number of children: N/A  . Years of education: N/A   Occupational History  . Not on file.   Social History Main Topics  . Smoking status: Former Smoker    Packs/day: 0.25    Quit date: 03/18/1994  . Smokeless tobacco: Never Used  . Alcohol use No  . Drug use: No  . Sexual activity: Not on file   Other Topics Concern  . Not on file   Social History Narrative  . No narrative on file     Family History  Problem Relation Age of Onset  . Colon cancer Brother   . Heart attack Father      Review of Systems Positive for Shortness of breath cough congestion Negative for: General:  chills, fever, night sweats or weight changes.  Cardiovascular: PND orthopnea syncope dizziness  Dermatological skin lesions  rashes Respiratory: Positive for Cough congestion Urologic: Frequent urination urination at night and hematuria Abdominal: negative for nausea, vomiting, diarrhea, bright red blood per rectum, melena, or hematemesis Neurologic: negative for visual changes, and/or hearing changes  All other systems reviewed and are otherwise negative except as noted above.  Labs: No results for input(s): CKTOTAL, CKMB, TROPONINI in the last 72 hours. Lab Results  Component Value Date   WBC 5.5 05/08/2017   HGB 10.1 (L) 05/08/2017   HCT 31.0 (L) 05/08/2017   MCV 101.0 (H) 05/08/2017   PLT 183 05/08/2017    Recent Labs Lab 05/06/17 1553  05/08/17 2100 05/09/17 1241  NA 140  < > 138  --   K 4.9  < > 4.0 4.2  CL 101  < > 101  --   CO2 34*  < > 29  --   BUN 22*  < > 23*  --   CREATININE 0.96  < > 0.99  --   CALCIUM 9.4  < > 9.2  --   PROT 7.2  --   --   --   BILITOT 0.8  --   --   --   ALKPHOS 105  --   --   --   ALT 14  --   --   --   AST 28  --   --   --   GLUCOSE 113*  < > 175*  --   < > = values in this interval not displayed. Lab Results  Component Value Date   CHOL 211 (H) 02/09/2016   HDL 65 02/09/2016   LDLCALC 112 (H) 02/09/2016   TRIG 169 (H) 02/09/2016   No results found for: DDIMER  Radiology/Studies:  Dg Chest Port 1 View  Result Date: 05/07/2017 CLINICAL DATA:  Shortness of breath EXAM: PORTABLE CHEST 1 VIEW COMPARISON:  Chest radiograph 05/06/2017 FINDINGS: Unchanged cardiomegaly and calcific aortic atherosclerosis. Left basilar consolidation and right basilar opacities is unchanged. IMPRESSION: Unchanged appearance of the chest with left basilar consolidation, cardiomegaly and right basilar opacities. Electronically Signed   By: Ulyses Jarred M.D.   On: 05/07/2017 05:52   Dg Chest Portable 1 View  Result Date: 05/06/2017 CLINICAL DATA:  Shortness of breath.  Altered mental status. EXAM: PORTABLE CHEST 1 VIEW COMPARISON:  February 09, 2016 FINDINGS: No pneumothorax. Interstitial  opacities diffusely on the right. More focal opacity in left retrocardiac region. No other interval changes. IMPRESSION: 1. Diffuse interstitial opacities on the right may represent asymmetric edema or developing infiltrate. More focal infiltrate is seen in the left base, similar since the previous study. Electronically Signed   By: Dorise Bullion III M.D   On: 05/06/2017 17:21    EKG: Atrial fibrillation with rapid ventricular rate and nonspecific ST and T-wave changes  Weights: Filed Weights   05/06/17 1549 05/06/17 2137  Weight: (!) 153.4 kg (338 lb 3 oz) (!) 152.1 kg (335 lb 5.1 oz)     Physical Exam: Blood pressure 126/61, pulse (!) 135, temperature (!) 97.5 F (36.4 C), temperature source Oral, resp. rate 16, height 5\' 2"  (1.575 m), weight (!) 152.1 kg (335 lb 5.1 oz), SpO2 96 %. Body mass index is 61.33 kg/m. General: Well developed, well nourished, in no acute distress. Head eyes ears nose throat: Normocephalic, atraumatic, sclera non-icteric, no xanthomas, nares are without discharge. No apparent thyromegaly and/or mass  Lungs: Normal respiratory effort.  no wheezes, no rales, no rhonchi.  Heart: Irregular with normal S1 S2. no murmur gallop, no rub, PMI is normal size and placement, carotid upstroke normal without bruit, jugular venous pressure is normal Abdomen: Soft, non-tender,  distended with normoactive bowel sounds. No hepatomegaly. No rebound/guarding. No obvious abdominal masses. Abdominal aorta is normal size without bruit Extremities: 2+ edema. no cyanosis, no clubbing, no ulcers  Peripheral : 2+ bilateral upper extremity pulses, 0 + bilateral femoral pulses, 0 + bilateral dorsal pedal pulse Neuro: Alert and oriented. No facial asymmetry. No focal deficit. Moves all extremities spontaneously. Musculoskeletal: Normal muscle tone without kyphosis Psych:  Responds to questions appropriately with a normal affect.    Assessment: 82 year old female with chronic obstructive  pulmonary disease sleep apnea hypoxia and anemia essential hypertension status post recent episode of respiratory failure and sepsis now with the paroxysmal nonvalvular atrial fibrillation with rapid ventricular rate  Plan: 1. Continue diltiazem drip and add oral diltiazem for heart rate at rest below 100 bpm 2. Heparin for further risk reduction in stroke with atrial fibrillation and consideration of addition of fall anticoagulation thereafter 3. Echocardiogram for LV systolic dysfunction valvular heart disease contributing to above 4. No change in treatment of respiratory failure urinary tract infection 5. Further diagnostic testing and treatment options after above  Signed, Corey Skains M.D. [facility] 05/10/2017, 12:53 PM

## 2017-05-10 NOTE — Progress Notes (Signed)
Pts HR is still sustaining in 130s, cardizem gtt will be increased to 12.5. Due to patients habitus, BP will be taken in right lower leg. I will continue to assess.

## 2017-05-10 NOTE — Progress Notes (Signed)
[place] [facility] [place] NAME: Kathy Guzman    MR#:  [id number]  DATE OF BIRTH:  October 16, 1935  SUBJECTIVE:  For the diarrhea. Developed A. fib with RVR last night, transferred to telemetry, started on Cardizem drip.Marland Kitchen No chest pain or shortness of breath today. No further diarrhea.   CHIEF COMPLAINT:   Chief Complaint  Patient presents with  . Altered Mental Status    REVIEW OF SYSTEMS:   Review of Systems  Constitutional: Negative for chills and fever.  HENT: Negative for hearing loss.   Eyes: Negative for blurred vision, double vision and photophobia.  Respiratory: Positive for cough. Negative for hemoptysis and shortness of breath.   Cardiovascular: Negative for palpitations, orthopnea and leg swelling.  Gastrointestinal: Negative for abdominal pain, diarrhea and vomiting.  Genitourinary: Negative for dysuria and urgency.  Musculoskeletal: Negative for myalgias and neck pain.  Skin: Negative for rash.  Neurological: Negative for dizziness, focal weakness, seizures, weakness and headaches.  Psychiatric/Behavioral: Negative for memory loss. The patient does not have insomnia.      DRUG ALLERGIES:   Allergies  Allergen Reactions  . Nsaids Other (See Comments)    Reaction:  Unknown   . Tramadol Itching    VITALS:  Blood pressure 113/72, pulse (!) 139, temperature (!) 97.5 F (36.4 C), temperature source Oral, resp. rate 16, height 5\' 2"  (1.575 m), weight (!) 152.1 kg (335 lb 5.1 oz), SpO2 96 %.  PHYSICAL EXAMINATION:  GENERAL:  82 y.o.-year-old patient lying in the bed with no acute distress,morbidlly obese. EYES: Pupils equal, round, reactive to light . No scleral icterus. Extraocular muscles intact.  HEENT: Head atraumatic, normocephalic. Oropharynx and nasopharynx clear.  NECK:  Supple, no jugular venous distention. No thyroid enlargement, no tenderness.  LUNGS: Decreased breath sounds at bases., no wheezing, rales,rhonchi  or crepitation. No use of accessory muscles of respiration.  CARDIOVASCULAR: S1, S2 nirregular No murmurs, rubs, or gallops.  ABDOMEN: Soft, nontender, nondistended. Bowel sounds present. No organomegaly or mass.  EXTREMITIES: No pedal edema, cyanosis, or clubbing.  NEUROLOGIC: Alert, awake, oriented, no focal deficit.  PSYCHIATRIC: The patient is alert , awake, oriented. SKIN: No obvious rash, lesion, or ulcer.    LABORATORY PANEL:   CBC  Recent Labs Lab 05/08/17 0423  WBC 5.5  HGB 10.1*  HCT 31.0*  PLT 183   ------------------------------------------------------------------------------------------------------------------  Chemistries   Recent Labs Lab 05/06/17 1553  05/08/17 2100 05/09/17 1241  NA 140  < > 138  --   K 4.9  < > 4.0 4.2  CL 101  < > 101  --   CO2 34*  < > 29  --   GLUCOSE 113*  < > 175*  --   BUN 22*  < > 23*  --   CREATININE 0.96  < > 0.99  --   CALCIUM 9.4  < > 9.2  --   MG  --   < >  --  1.9  AST 28  --   --   --   ALT 14  --   --   --   ALKPHOS 105  --   --   --   BILITOT 0.8  --   --   --   < > = values in this interval not displayed. ------------------------------------------------------------------------------------------------------------------  Cardiac Enzymes No results for input(s): TROPONINI in the last 168 hours. ------------------------------------------------------------------------------------------------------------------  RADIOLOGY:  No results found.  EKG:   Orders placed or  performed during the hospital encounter of 05/06/17  . EKG 12-Lead  . EKG 12-Lead  . EKG 12-Lead  . EKG 12-Lead  . EKG 12-Lead  . EKG 12-Lead    ASSESSMENT AND PLAN:  hyper capneic respiratory failure ,lef lower lobe pneumonia;Continue antibiotics, off the BiPAP, continue oxygen, continue antibiotics. afib  fibrillation with RVR last night: On Cardizem drip. Still tachycardic at 130s but she is in sinus tachycardia at this time. Cardiology  consult requested. Likely cause will be respiratory distress with pneumonia causing A. fib with RVR.;    2. Hypoglycemia resolved, discontinue IV fluids. Encourage by mouth intake. Start the patient on diet.  3.recurrent UTI;MRSA screen positive,continue , continue cefepime.Will follow urine cultures.  4.GERD; continue PPIs:   5.gout  6. Diarrhea, C. difficile colitis. Continue by mouth vancomycin. Add probiotics. No further diarrhea. Continue enteric precautions also.  Hypothyroid;  ;on l thyroxine.  Bed bound for 3 years as per daughter   D/w daughter ,RN   records are reviewed and case discussed with Care Management/Social Workerr. Management plans discussed with the patient, family and they are in agreement.  CODE STATUS: DNR  TOTAL TIME TAKING CARE OF THIS PATIENT:35  POSSIBLE D/C IN 1-2 DAYS, DEPENDING ON CLINICAL CONDITION.   Kathy Guzman M.D on 05/10/2017 at 12:09 PM  Between 7am to 6pm - Pager - [phone]  After 6pm go to [email or web address] - password [email or web address] [facility]  Office  [phone]  CC: Primary care physician; Leonel Ramsay, MD   Note: This dictation was prepared with Dragon dictation along with smaller phrase technology. Any transcriptional errors that result from this process are unintentional.

## 2017-05-10 NOTE — Progress Notes (Signed)
MD paged to notify of iv cardizem adm as ordered with no change in HR. Awaiting return call

## 2017-05-10 NOTE — Progress Notes (Signed)
A&O. Transferred from ortho due to Afib with RVR. Heart rate in the 130s. Cardizem drip started. BP stable. On 2L O2. IV antibiotics to be given. Will continue to monitor.

## 2017-05-10 NOTE — Progress Notes (Signed)
Pts HR is sustaining in 130s, pt appears to be ST. Cardizem gtt increased to 10mg /hr. I will continue to assess.

## 2017-05-10 NOTE — Progress Notes (Signed)
IV Lopressor adm as order per MD at 0300. HR now fluctuating b/w 120 and 140 at this time. bp stable 135/83  will cont to monitor

## 2017-05-10 NOTE — Progress Notes (Signed)
Pt is sustaining at 100-135.  Cardizem gtt is infusing at 12.5, PO dose of cardizem has been given. MD Nehemiah Massed notified for instructions if to titrate patient up to 15mg /hr or continue at 12.5. Instructed to maintain gtt at 12.5 and to monitor. I will continue to assess.

## 2017-05-10 NOTE — Progress Notes (Signed)
MD paged to notify of pt HR in the 130's - 150's now in afib per cmd [place], DO ordered EKG now and is to put med order in

## 2017-05-10 NOTE — Progress Notes (Signed)
Patient has atrial fibrillation with rapid rate around 130 to 140 beats per minute. Will transfer patient to telemetry and start patient on iv cardizem drip for rate control. Discussed with RN taking care of patient

## 2017-05-10 NOTE — Progress Notes (Signed)
Pt transferred to 248. Report given to [facility], Therapist, sports. CMD notified.

## 2017-05-10 NOTE — Progress Notes (Signed)
Increased cardizem drip to 7.5mg /hr. Will continue to monitor.

## 2017-05-10 NOTE — Progress Notes (Signed)
[place] INFECTIOUS DISEASE PROGRESS NOTE Date of Admission:  05/06/2017     ID: Kathy Guzman is a 82 y.o. female with UTI, sepsis Active Problems:   Respiratory failure (HCC)   Urinary tract infection without hematuria   SOB (shortness of breath)   Subjective: Out of unit, no fevers. Wbc down.  C diff +, UCX BCX neg  ROS  Eleven systems are reviewed and negative except per hpi  Medications:  Antibiotics Given (last 72 hours)    Date/Time Action Medication Dose Rate   05/07/17 2251 New Bag/Given   vancomycin (VANCOCIN) IVPB 1000 mg/200 mL premix 1,000 mg 200 mL/hr   05/07/17 2313 New Bag/Given   meropenem (MERREM) 1 g in sodium chloride 0.9 % 100 mL IVPB 1 g 200 mL/hr   05/08/17 0500 New Bag/Given   meropenem (MERREM) 1 g in sodium chloride 0.9 % 100 mL IVPB 1 g 200 mL/hr   05/08/17 0839 New Bag/Given   vancomycin (VANCOCIN) IVPB 1000 mg/200 mL premix 1,000 mg 200 mL/hr   05/08/17 1357 New Bag/Given   meropenem (MERREM) 1 g in sodium chloride 0.9 % 100 mL IVPB 1 g 200 mL/hr   05/08/17 2024 Given   vancomycin (VANCOCIN) 50 mg/mL oral solution 125 mg 125 mg    05/08/17 2025 New Bag/Given   meropenem (MERREM) 1 g in sodium chloride 0.9 % 100 mL IVPB 1 g 200 mL/hr   05/08/17 2025 New Bag/Given   vancomycin (VANCOCIN) IVPB 1000 mg/200 mL premix 1,000 mg 200 mL/hr   05/09/17 0059 Given   vancomycin (VANCOCIN) 50 mg/mL oral solution 125 mg 125 mg    05/09/17 0624 New Bag/Given   meropenem (MERREM) 1 g in sodium chloride 0.9 % 100 mL IVPB 1 g 200 mL/hr   05/09/17 0914 Given   vancomycin (VANCOCIN) 50 mg/mL oral solution 125 mg 125 mg    05/09/17 1427 New Bag/Given   meropenem (MERREM) 1 g in sodium chloride 0.9 % 100 mL IVPB 1 g 200 mL/hr   05/09/17 1436 Given   vancomycin (VANCOCIN) 50 mg/mL oral solution 125 mg 125 mg    05/09/17 2033 Given   vancomycin (VANCOCIN) 50 mg/mL oral solution 125 mg 125 mg    05/09/17 2038 New Bag/Given   meropenem (MERREM) 1 g in sodium  chloride 0.9 % 100 mL IVPB 1 g 200 mL/hr   05/10/17 0311 Given   vancomycin (VANCOCIN) 50 mg/mL oral solution 125 mg 125 mg    05/10/17 0702 New Bag/Given   meropenem (MERREM) 1 g in sodium chloride 0.9 % 100 mL IVPB 1 g 200 mL/hr   05/10/17 0917 Given   vancomycin (VANCOCIN) 50 mg/mL oral solution 125 mg 125 mg      . acetaminophen  1,300 mg Oral BID  . acidophilus  2 capsule Oral TID  . chlorhexidine  15 mL Mouth Rinse BID  . diltiazem  240 mg Oral Daily  . enoxaparin (LOVENOX) injection  40 mg Subcutaneous Q12H  . fluticasone  2 spray Each Nare Daily  . ipratropium-albuterol  3 mL Nebulization Q6H  . levothyroxine  75 mcg Oral QAC breakfast  . mouth rinse  15 mL Mouth Rinse q12n4p  . mouth rinse  15 mL Mouth Rinse BID  . methylPREDNISolone (SOLU-MEDROL) injection  40 mg Intravenous Q12H  . vancomycin  125 mg Oral QID  . Vitamin D (Ergocalciferol)  50,000 Units Oral Q30 days    Objective: Vital signs in last 24 hours: Temp:  [  97.5 F (36.4 C)-98.5 F (36.9 C)] 97.5 F (36.4 C) (07/09 0926) Pulse Rate:  [94-145] 135 (07/09 1427) Resp:  [16-18] 16 (07/09 0500) BP: (107-181)/(48-120) 135/60 (07/09 1427) SpO2:  [93 %-98 %] 96 % (07/09 0926) Constitutional:  Morbidly obese >300#,  she is awake and interactive HENT: [place]/AT, PERRLA, no scleral icterus Mouth/Throat: Oropharynx is clear and moist. No oropharyngeal exudate.  Cardiovascular:distant  Pulmonary/Chest: poor air movement, wheeze Neck = supple, no nuchal rigidity Abdominal: Soft. Bowel sounds are normal.  exhibits no distension. There is no tenderness.  Lymphadenopathy: no cervical adenopathy. No axillary adenopathy Neurological: alert and interacitve.  Skin: some bruising Psychiatric: a normal mood and affect.  behavior is normal.   Lab Results  Recent Labs  05/08/17 0423 05/08/17 2100 05/09/17 1241  WBC 5.5  --   --   HGB 10.1*  --   --   HCT 31.0*  --   --   NA 139 138  --   K 4.4 4.0 4.2  CL 102 101  --    CO2 30 29  --   BUN 21* 23*  --   CREATININE 1.00 0.99  --     Microbiology: Results for orders placed or performed during the hospital encounter of 05/06/17  Urine Culture     Status: None   Collection Time: 05/06/17  4:10 PM  Result Value Ref Range Status   Specimen Description URINE, CATHETERIZED  Final   Special Requests ertapenem (daily IM injections) Normal  Final   Culture   Final    NO GROWTH Performed at [facility], [address] [place], [place] [address]    Report Status 05/08/2017 FINAL  Final  Culture, blood (routine x 2)     Status: None (Preliminary result)   Collection Time: 05/06/17  5:56 PM  Result Value Ref Range Status   Specimen Description BLOOD LAC  Final   Special Requests   Final    BOTTLES DRAWN AEROBIC AND ANAEROBIC Blood Culture adequate volume   Culture NO GROWTH 4 DAYS  Final   Report Status PENDING  Incomplete  Culture, blood (routine x 2)     Status: None (Preliminary result)   Collection Time: 05/06/17  5:56 PM  Result Value Ref Range Status   Specimen Description BLOOD RFA  Final   Special Requests   Final    BOTTLES DRAWN AEROBIC AND ANAEROBIC Blood Culture adequate volume   Culture NO GROWTH 4 DAYS  Final   Report Status PENDING  Incomplete  MRSA PCR Screening     Status: Abnormal   Collection Time: 05/06/17  9:28 PM  Result Value Ref Range Status   MRSA by PCR POSITIVE (A) NEGATIVE Final    Comment:        The GeneXpert MRSA Assay (FDA approved for NASAL specimens only), is one component of a comprehensive MRSA colonization surveillance program. It is not intended to diagnose MRSA infection nor to guide or monitor treatment for MRSA infections. RESULT CALLED TO, READ BACK BY AND VERIFIED WITH: TESS THOMAS AT 2315 ON 05/06/17 RWW   C difficile quick scan w PCR reflex     Status: Abnormal   Collection Time: 05/08/17  2:05 PM  Result Value Ref Range Status   C Diff antigen POSITIVE (A) NEGATIVE Final   C Diff toxin  NEGATIVE NEGATIVE Final   C Diff interpretation Results are indeterminate. See PCR results.  Final  Clostridium Difficile by PCR  Status: Abnormal   Collection Time: 05/08/17  2:05 PM  Result Value Ref Range Status   Toxigenic C Difficile by pcr POSITIVE (A) NEGATIVE Final    Comment: Positive for toxigenic C. difficile with little to no toxin production. Only treat if clinical presentation suggests symptomatic illness.    Studies/Results: No results found.  Assessment/Plan: Kathy Guzman is a 82 y.o. female with morbid obesity admitted with AMS, UTI, resp failure. Was on ertepenem at [facility]. Seems to be improving. Out of unit, CXs neg but C diff +. WBC down 11->5 Day 5 vanco Day 5 meropenem  Recommendations With negative bcx and ucx would dc meropenem and vanco Continue oral vanco for 10 day course Thank you very much for the consult. Will follow with you.  FITZGERALD, DAVID P   05/10/2017, 4:03 PM

## 2017-05-10 NOTE — Progress Notes (Signed)
Spoke with [name], daughter, to notify of transfer to room 248. Verbalized understanding

## 2017-05-11 LAB — CULTURE, BLOOD (ROUTINE X 2)
Culture: NO GROWTH
Culture: NO GROWTH
SPECIAL REQUESTS: ADEQUATE
Special Requests: ADEQUATE

## 2017-05-11 MED ORDER — PROMETHAZINE HCL 25 MG/ML IJ SOLN
12.5000 mg | Freq: Four times a day (QID) | INTRAMUSCULAR | Status: DC | PRN
  Administered 2017-05-11 – 2017-05-13 (×3): 12.5 mg via INTRAVENOUS
  Filled 2017-05-11 (×3): qty 1

## 2017-05-11 MED ORDER — APIXABAN 5 MG PO TABS
5.0000 mg | ORAL_TABLET | Freq: Two times a day (BID) | ORAL | Status: DC
  Administered 2017-05-11 – 2017-05-16 (×10): 5 mg via ORAL
  Filled 2017-05-11 (×10): qty 1

## 2017-05-11 NOTE — Plan of Care (Signed)
Problem: Skin Integrity: Goal: Risk for impaired skin integrity will decrease Outcome: Progressing No impaired skin noted.  Bed set to turn every 2 hours.

## 2017-05-11 NOTE — Progress Notes (Signed)
DR Vianne Bulls was informed about pt persisted vomiting and low grade temp of 99. f , see new order , will continue to monitor

## 2017-05-11 NOTE — Clinical Social Work Note (Signed)
CSW spoke with patient she states she has been pleased with the care that has been provided at [facility] and she plans to return once she is medically ready for discharge and orders have been received.  CSW to continue to follow patient's progress throughout discharge planning.  Jones Broom. [place], MSW, [place]  05/11/2017 3:40 PM

## 2017-05-11 NOTE — Progress Notes (Signed)
[facility] Encounter Note  Patient: Kathy Guzman / Admit Date: 05/06/2017 / Date of Encounter: 05/11/2017, 8:48 AM   Subjective: No significant change from yesterday. Patient still short of breath weak fatigue and unable to ambulate. Heart rate control of atrial fibrillation much better at this time with addition of oral diltiazem. COPD and pulmonary issues is slightly improved  Review of Systems: Positive for: Shortness of breath edema Negative for: Vision change, hearing change, syncope, dizziness, nausea, vomiting,diarrhea, bloody stool, stomach pain, cough, congestion, diaphoresis, urinary frequency, urinary pain,skin lesions, skin rashes Others previously listed  Objective: Telemetry: Atrial fibrillation with controlled ventricular rate Physical Exam: Blood pressure (!) 142/53, pulse 70, temperature 98 F (36.7 C), resp. rate 19, height 5\' 2"  (1.575 m), weight (!) 152.1 kg (335 lb 5.1 oz), SpO2 93 %. Body mass index is 61.33 kg/m. General: Well developed, well nourished, in no acute distress. Head: Normocephalic, atraumatic, sclera non-icteric, no xanthomas, nares are without discharge. Neck: No apparent masses Lungs: Normal respirations with no wheezes, no rhonchi, no rales , few basilar crackles   Heart: Irregular rate and rhythm, normal S1 S2, no murmur, no rub, no gallop, PMI is normal size and placement, carotid upstroke normal without bruit, jugular venous pressure normal Abdomen: Soft, non-tender, non-distended with normoactive bowel sounds. No hepatosplenomegaly. Abdominal aorta is normal size without bruit Extremities: 1+ edema, no clubbing, no cyanosis, no ulcers,  Peripheral: 2+ radial, 0 + femoral, 0 + dorsal pedal pulses Neuro: Alert and oriented. Moves all extremities spontaneously. Psych:  Responds to questions appropriately with a normal affect.   Intake/Output Summary (Last 24 hours) at 05/11/17 0848 Last data filed at 05/11/17 2841  Gross per 24 hour  Intake           558.25 ml  Output                0 ml  Net           558.25 ml    Inpatient Medications:  . acetaminophen  1,300 mg Oral BID  . acidophilus  2 capsule Oral TID  . chlorhexidine  15 mL Mouth Rinse BID  . Chlorhexidine Gluconate Cloth  6 each Topical Q0600  . diltiazem  240 mg Oral Daily  . enoxaparin (LOVENOX) injection  40 mg Subcutaneous Q12H  . fluticasone  2 spray Each Nare Daily  . ipratropium-albuterol  3 mL Nebulization TID  . levothyroxine  75 mcg Oral QAC breakfast  . mouth rinse  15 mL Mouth Rinse q12n4p  . mouth rinse  15 mL Mouth Rinse BID  . methylPREDNISolone (SOLU-MEDROL) injection  40 mg Intravenous Q12H  . vancomycin  125 mg Oral QID  . Vitamin D (Ergocalciferol)  50,000 Units Oral Q30 days   Infusions:  . ampicillin-sulbactam (UNASYN) IV Stopped (05/11/17 0605)    Labs:  Recent Labs  05/08/17 2100 05/09/17 1241  NA 138  --   K 4.0 4.2  CL 101  --   CO2 29  --   GLUCOSE 175*  --   BUN 23*  --   CREATININE 0.99  --   CALCIUM 9.2  --   MG  --  1.9   No results for input(s): AST, ALT, ALKPHOS, BILITOT, PROT, ALBUMIN in the last 72 hours. No results for input(s): WBC, NEUTROABS, HGB, HCT, MCV, PLT in the last 72 hours. No results for input(s): CKTOTAL, CKMB, TROPONINI in the last 72 hours. Invalid input(s): POCBNP No results for input(s):  HGBA1C in the last 72 hours.   Weights: Filed Weights   05/06/17 1549 05/06/17 2137  Weight: (!) 153.4 kg (338 lb 3 oz) (!) 152.1 kg (335 lb 5.1 oz)     Radiology/Studies:  Dg Chest Port 1 View  Result Date: 05/07/2017 CLINICAL DATA:  Shortness of breath EXAM: PORTABLE CHEST 1 VIEW COMPARISON:  Chest radiograph 05/06/2017 FINDINGS: Unchanged cardiomegaly and calcific aortic atherosclerosis. Left basilar consolidation and right basilar opacities is unchanged. IMPRESSION: Unchanged appearance of the chest with left basilar consolidation, cardiomegaly and right basilar opacities.  Electronically Signed   By: Ulyses Jarred M.D.   On: 05/07/2017 05:52   Dg Chest Portable 1 View  Result Date: 05/06/2017 CLINICAL DATA:  Shortness of breath.  Altered mental status. EXAM: PORTABLE CHEST 1 VIEW COMPARISON:  February 09, 2016 FINDINGS: No pneumothorax. Interstitial opacities diffusely on the right. More focal opacity in left retrocardiac region. No other interval changes. IMPRESSION: 1. Diffuse interstitial opacities on the right may represent asymmetric edema or developing infiltrate. More focal infiltrate is seen in the left base, similar since the previous study. Electronically Signed   By: Dorise Bullion III M.D   On: 05/06/2017 17:21     Assessment and Recommendation  82 y.o. female with acute on chronic diastolic dysfunction heart failure status post urinary tract infection sepsis and COPD exacerbation with hypoxia and anemia and new onset atrial fibrillation with rapid ventricular rate now more controlled on oral diltiazem 1. Continue oral diltiazem at this time for further risk reduction of rapid heart rate and adjust dose as necessary for goal heart rate between 60 and 90 bpm at rest 2. Consideration of anticoagulation for further risk reduction in stroke although close consideration of concerns of anemia 3. Continue oxygenation and supportive care for a pulmonary infection COPD and kidney infection 4. Begin ambulation as able for improvements and further adjustments of medications 5. No further cardiac diagnostics necessary at this time  Signed, Serafina Royals M.D. FACC

## 2017-05-11 NOTE — Progress Notes (Signed)
ANTICOAGULATION CONSULT NOTE - Initial Consult  Pharmacy Consult for Apixaban Indication: atrial fibrillation  Allergies  Allergen Reactions  . Nsaids Other (See Comments)    Reaction:  Unknown   . Tramadol Itching    Patient Measurements: Height: 5\' 2"  (157.5 cm) Weight: (!) 335 lb 5.1 oz (152.1 kg) IBW/kg (Calculated) : 50.1 Heparin Dosing Weight:   Vital Signs: Temp: 98.1 F (36.7 C) (07/10 0929) Temp Source: Oral (07/10 0929) BP: 131/86 (07/10 0929) Pulse Rate: 76 (07/10 0929)  Labs:  Recent Labs  05/08/17 2100  CREATININE 0.99    Estimated Creatinine Clearance: 65 mL/min (by C-G formula based on SCr of 0.99 mg/dL).   Medical History: Past Medical History:  Diagnosis Date  . Anxiety   . CHF (congestive heart failure) ([place])   . COPD (chronic obstructive pulmonary disease) ([place])   . Depression   . GERD (gastroesophageal reflux disease)   . Gout   . Hyperlipidemia   . Hypertension   . Hypothyroid   . Osteoarthritis   . Recurrent UTI   . Skin cancer   . Urethral stricture     Medications:    Assessment: Pharmacy consulted to dose apixaban in this 82 year old woman with atrial fibrillation Goal of Therapy:      Plan:  Will start apixaban 5 mg PO BId.   Kathy Guzman D 05/11/2017,2:20 PM

## 2017-05-11 NOTE — Progress Notes (Signed)
[place] [facility] [place] NAME: Kathy Guzman    MR#:  [id number]  DATE OF BIRTH:  05-30-1935  Patient: Complains of nausea, burning in stomach today. Heart rate better controlled.   CHIEF COMPLAINT:   Chief Complaint  Patient presents with  . Altered Mental Status    REVIEW OF SYSTEMS:   Review of Systems  Constitutional: Negative for chills and fever.  HENT: Negative for hearing loss.   Eyes: Negative for blurred vision, double vision and photophobia.  Respiratory: Positive for cough. Negative for hemoptysis and shortness of breath.   Cardiovascular: Negative for palpitations, orthopnea and leg swelling.  Gastrointestinal: Positive for heartburn and nausea. Negative for abdominal pain, diarrhea and vomiting.  Genitourinary: Negative for dysuria and urgency.  Musculoskeletal: Negative for myalgias and neck pain.  Skin: Negative for rash.  Neurological: Negative for dizziness, focal weakness, seizures, weakness and headaches.  Psychiatric/Behavioral: Negative for memory loss. The patient does not have insomnia.      DRUG ALLERGIES:   Allergies  Allergen Reactions  . Nsaids Other (See Comments)    Reaction:  Unknown   . Tramadol Itching    VITALS:  Blood pressure 131/86, pulse 76, temperature 98.1 F (36.7 C), temperature source Oral, resp. rate 18, height 5\' 2"  (1.575 m), weight (!) 152.1 kg (335 lb 5.1 oz), SpO2 95 %.  PHYSICAL EXAMINATION:  GENERAL:  82 y.o.-year-old patient lying in the bed with no acute distress,morbidlly obese. EYES: Pupils equal, round, reactive to light . No scleral icterus. Extraocular muscles intact.  HEENT: Head atraumatic, normocephalic. Oropharynx and nasopharynx clear.  NECK:  Supple, no jugular venous distention. No thyroid enlargement, no tenderness.  LUNGS: Decreased breath sounds at bases., no wheezing, rales,rhonchi or crepitation. No use of accessory muscles of respiration.   CARDIOVASCULAR: S1, S2 nirregular No murmurs, rubs, or gallops.  ABDOMEN: Epigastric tenderness present .Soft, nontender, nondistended. Bowel sounds present. No organomegaly or mass.  EXTREMITIES: No pedal edema, cyanosis, or clubbing.  NEUROLOGIC: Alert, awake, oriented, no focal deficit.  PSYCHIATRIC: The patient is alert , awake, oriented. SKIN: No obvious rash, lesion, or ulcer.    LABORATORY PANEL:   CBC  Recent Labs Lab 05/08/17 0423  WBC 5.5  HGB 10.1*  HCT 31.0*  PLT 183   ------------------------------------------------------------------------------------------------------------------  Chemistries   Recent Labs Lab 05/06/17 1553  05/08/17 2100 05/09/17 1241  NA 140  < > 138  --   K 4.9  < > 4.0 4.2  CL 101  < > 101  --   CO2 34*  < > 29  --   GLUCOSE 113*  < > 175*  --   BUN 22*  < > 23*  --   CREATININE 0.96  < > 0.99  --   CALCIUM 9.4  < > 9.2  --   MG  --   < >  --  1.9  AST 28  --   --   --   ALT 14  --   --   --   ALKPHOS 105  --   --   --   BILITOT 0.8  --   --   --   < > = values in this interval not displayed. ------------------------------------------------------------------------------------------------------------------  Cardiac Enzymes No results for input(s): TROPONINI in the last 168 hours. ------------------------------------------------------------------------------------------------------------------  RADIOLOGY:  No results found.  EKG:   Orders placed or performed during the hospital encounter of 05/06/17  . EKG 12-Lead  .  EKG 12-Lead  . EKG 12-Lead  . EKG 12-Lead  . EKG 12-Lead  . EKG 12-Lead    ASSESSMENT AND PLAN:  hyper capneic respiratory failure ,lef lower lobe pneumonia;Continue antibiotics, off the BiPAP, continue oxygen, Ulcers, urine cultures are negative. Marland Kitchen afib  fibrillation with RVR ; on Cardizem by mouth 240 mg daily, stop the Cardizem drip, continue full  dose anticoagulation with The one today. Patient is  bedridden for last 3 years and high risk for strokes.    2. Hypoglycemia resolved,   3.recurrent UTI;MRSA screen positive, discontinue IV antibiotics as urine cultures are negative. Echocardiogram guarding noted we will continue by mouth vancomycin and DC IV antibiotics.  4.GERD; continue PPIs:   5.gout  6. Diarrhea, C. difficile colitis. Continue by mouth vancomycin. Add probiotics. No further diarrhea. Continue enteric precautions also.  Hypothyroid;  ;on l thyroxine.  Bed bound for 3 years as per daughter   D/w daughter ,RN she has nausea and heartburn today and not eating well. Continue by mouth vancomycin as per ID instructions, if the nausea and heartburn gets better  discharge tomorrow.   records are reviewed and case discussed with Care Management/Social Workerr. Management plans discussed with the patient, family and they are in agreement.  CODE STATUS: DNR  TOTAL TIME TAKING CARE OF THIS PATIENT:35  POSSIBLE D/C IN 1-2 DAYS, DEPENDING ON CLINICAL CONDITION.   Epifanio Lesches M.D on 05/11/2017 at 1:37 PM  Between 7am to 6pm - Pager - [phone]  After 6pm go to [email or web address] - password [email or web address] [facility]  Office  [phone]  CC: Primary care physician; Leonel Ramsay, MD   Note: This dictation was prepared with Dragon dictation along with smaller phrase technology. Any transcriptional errors that result from this process are unintentional.

## 2017-05-12 NOTE — Progress Notes (Signed)
[facility] [place] NAME: Benay Pomeroy    MR#:  [id number]  DATE OF BIRTH:  [birth year]  : Less nausea, less abdominal pain today. Had low-grade temperature yesterday. No shortness of breath or chest pain.   CHIEF COMPLAINT:   Chief Complaint  Patient presents with  . Altered Mental Status    REVIEW OF SYSTEMS:   Review of Systems  Constitutional: Negative for chills and fever.  HENT: Negative for hearing loss.   Eyes: Negative for blurred vision, double vision and photophobia.  Respiratory: Negative for cough, hemoptysis and shortness of breath.   Cardiovascular: Negative for palpitations, orthopnea and leg swelling.  Gastrointestinal: Positive for heartburn and nausea. Negative for abdominal pain, diarrhea and vomiting.  Genitourinary: Negative for dysuria and urgency.  Musculoskeletal: Negative for myalgias and neck pain.  Skin: Negative for rash.  Neurological: Negative for dizziness, focal weakness, seizures, weakness and headaches.  Psychiatric/Behavioral: Negative for memory loss. The patient does not have insomnia.      DRUG ALLERGIES:   Allergies  Allergen Reactions  . Nsaids Other (See Comments)    Reaction:  Unknown   . Tramadol Itching    VITALS:  Blood pressure 128/66, pulse 81, temperature 98.2 F (36.8 C), temperature source Oral, resp. rate 18, height 5\' 2"  (1.575 m), weight (!) 152.1 kg (335 lb 5.1 oz), SpO2 96 %.  PHYSICAL EXAMINATION:  GENERAL:  82 y.o.-year-old patient lying in the bed with no acute distress,morbidlly obese. EYES: Pupils equal, round, reactive to light . No scleral icterus. Extraocular muscles intact.  HEENT: Head atraumatic, normocephalic. Oropharynx and nasopharynx clear.  NECK:  Supple, no jugular venous distention. No thyroid enlargement, no tenderness.  LUNGS: Decreased breath sounds at bases., no wheezing, rales,rhonchi or crepitation. No use of accessory muscles of  respiration.  CARDIOVASCULAR: S1, S2 nirregular No murmurs, rubs, or gallops.  ABDOMEN: Slight epigastric tenderness present but no rebound tenderness.   EXTREMITIES: No pedal edema, cyanosis, or clubbing.  NEUROLOGIC: Alert, awake, oriented, no focal deficit.  PSYCHIATRIC: The patient is alert , awake, oriented. SKIN: No obvious rash, lesion, or ulcer.    LABORATORY PANEL:   CBC  Recent Labs Lab 05/08/17 0423  WBC 5.5  HGB 10.1*  HCT 31.0*  PLT 183   ------------------------------------------------------------------------------------------------------------------  Chemistries   Recent Labs Lab 05/06/17 1553  05/08/17 2100 05/09/17 1241  NA 140  < > 138  --   K 4.9  < > 4.0 4.2  CL 101  < > 101  --   CO2 34*  < > 29  --   GLUCOSE 113*  < > 175*  --   BUN 22*  < > 23*  --   CREATININE 0.96  < > 0.99  --   CALCIUM 9.4  < > 9.2  --   MG  --   < >  --  1.9  AST 28  --   --   --   ALT 14  --   --   --   ALKPHOS 105  --   --   --   BILITOT 0.8  --   --   --   < > = values in this interval not displayed. ------------------------------------------------------------------------------------------------------------------  Cardiac Enzymes No results for input(s): TROPONINI in the last 168 hours. ------------------------------------------------------------------------------------------------------------------  RADIOLOGY:  No results found.  EKG:   Orders placed or performed during the hospital encounter of 05/06/17  . EKG 12-Lead  .  EKG 12-Lead  . EKG 12-Lead  . EKG 12-Lead  . EKG 12-Lead  . EKG 12-Lead    ASSESSMENT AND PLAN:  hyper capneic respiratory failure ,lef lower lobe pneumonia;, off the BiPAP, continue oxygen, blood cultures, urine cultures are negative, discontinue IV antibiotics as per ID recommendation.Marland Kitchen   afib  fibrillation with RVR ; controlled, stopped  the Cardizem drip, on Cardizem by mouth 240 mg daily, started Eliquis.   2. Hypoglycemia  resolved,   3.recurrent UTI;MRSA screen positive, discontinue IV antibiotics as urine cultures are negative.     4.GERD; continue PPIs:   5.gout  6. Diarrhea, C. difficile colitis. Continue by mouth vancomycin. Add probiotics. No further diarrhea. Continue enteric precautions also.  7.Hypothyroid;  ;on l thyroxine.  #8 nausea, vomiting epigastric pain likely due to gastritis: Continue IV PPIs, Phenergan today.  Likely discharge tomorrow back to skilled nursing  If stable.  Bed bound for 3 years as per daughter     D/w daughter ,RN she has nausea and heartburn today and not eating well. Continue by mouth vancomycin as per ID instructions, if the nausea and heartburn gets better  discharge tomorrow.   records are reviewed and case discussed with Care Management/Social Workerr. Management plans discussed with the patient, family and they are in agreement.  CODE STATUS: DNR  TOTAL TIME TAKING CARE OF THIS PATIENT:35  POSSIBLE D/C IN 1-2 DAYS, DEPENDING ON CLINICAL CONDITION.   Epifanio Lesches M.D on 05/12/2017 at 1:53 PM  Between 7am to 6pm - Pager - [phone]  After 6pm go to [email or web address] - password [email or web address] [email or web address]  Office  [phone]  CC: Primary care physician; Leonel Ramsay, MD   Note: This dictation was prepared with Dragon dictation along with smaller phrase technology. Any transcriptional errors that result from this process are unintentional.

## 2017-05-12 NOTE — Progress Notes (Signed)
[facility] Encounter Note  Patient: Kathy Guzman / Admit Date: 05/06/2017 / Date of Encounter: 05/12/2017, 1:18 PM   Subjective: No significant change from yesterday. Patient still short of breath weak fatigue and unable to ambulate. Heart rate control of atrial fibrillation much better at this time with addition of oral diltiazem. COPD and pulmonary issues is slightly improved mode patient still weak and fatigued  Review of Systems: Positive for: Shortness of breath edema Negative for: Vision change, hearing change, syncope, dizziness, nausea, vomiting,diarrhea, bloody stool, stomach pain, cough, congestion, diaphoresis, urinary frequency, urinary pain,skin lesions, skin rashes Others previously listed  Objective: Telemetry: Atrial fibrillation with controlled ventricular rate Physical Exam: Blood pressure 128/66, pulse 81, temperature 98.2 F (36.8 C), temperature source Oral, resp. rate 18, height 5\' 2"  (1.575 m), weight (!) 152.1 kg (335 lb 5.1 oz), SpO2 96 %. Body mass index is 61.33 kg/m. General: Well developed, well nourished, in no acute distress. Head: Normocephalic, atraumatic, sclera non-icteric, no xanthomas, nares are without discharge. Neck: No apparent masses Lungs: Normal respirations with no wheezes, no rhonchi, no rales , few basilar crackles   Heart: Irregular rate and rhythm, normal S1 S2, no murmur, no rub, no gallop, PMI is normal size and placement, carotid upstroke normal without bruit, jugular venous pressure normal Abdomen: Soft, non-tender, non-distended with normoactive bowel sounds. No hepatosplenomegaly. Abdominal aorta is normal size without bruit Extremities: 1+ edema, no clubbing, no cyanosis, no ulcers,  Peripheral: 2+ radial, 0 + femoral, 0 + dorsal pedal pulses Neuro: Alert and oriented. Moves all extremities spontaneously. Psych:  Responds to questions appropriately with a normal affect.   Intake/Output Summary (Last 24  hours) at 05/12/17 1318 Last data filed at 05/12/17 0100  Gross per 24 hour  Intake                0 ml  Output                0 ml  Net                0 ml    Inpatient Medications:  . acetaminophen  1,300 mg Oral BID  . acidophilus  2 capsule Oral TID  . apixaban  5 mg Oral BID  . chlorhexidine  15 mL Mouth Rinse BID  . Chlorhexidine Gluconate Cloth  6 each Topical Q0600  . diltiazem  240 mg Oral Daily  . fluticasone  2 spray Each Nare Daily  . ipratropium-albuterol  3 mL Nebulization TID  . levothyroxine  75 mcg Oral QAC breakfast  . mouth rinse  15 mL Mouth Rinse q12n4p  . mouth rinse  15 mL Mouth Rinse BID  . methylPREDNISolone (SOLU-MEDROL) injection  40 mg Intravenous Q12H  . vancomycin  125 mg Oral QID  . Vitamin D (Ergocalciferol)  50,000 Units Oral Q30 days   Infusions:    Labs: No results for input(s): NA, K, CL, CO2, GLUCOSE, BUN, CREATININE, CALCIUM, MG, PHOS in the last 72 hours. No results for input(s): AST, ALT, ALKPHOS, BILITOT, PROT, ALBUMIN in the last 72 hours. No results for input(s): WBC, NEUTROABS, HGB, HCT, MCV, PLT in the last 72 hours. No results for input(s): CKTOTAL, CKMB, TROPONINI in the last 72 hours. Invalid input(s): POCBNP No results for input(s): HGBA1C in the last 72 hours.   Weights: Filed Weights   05/06/17 1549 05/06/17 2137  Weight: (!) 153.4 kg (338 lb 3 oz) (!) 152.1 kg (335 lb 5.1 oz)  Radiology/Studies:  Dg Chest Port 1 View  Result Date: 05/07/2017 CLINICAL DATA:  Shortness of breath EXAM: PORTABLE CHEST 1 VIEW COMPARISON:  Chest radiograph 05/06/2017 FINDINGS: Unchanged cardiomegaly and calcific aortic atherosclerosis. Left basilar consolidation and right basilar opacities is unchanged. IMPRESSION: Unchanged appearance of the chest with left basilar consolidation, cardiomegaly and right basilar opacities. Electronically Signed   By: Ulyses Jarred M.D.   On: 05/07/2017 05:52   Dg Chest Portable 1 View  Result Date:  05/06/2017 CLINICAL DATA:  Shortness of breath.  Altered mental status. EXAM: PORTABLE CHEST 1 VIEW COMPARISON:  February 09, 2016 FINDINGS: No pneumothorax. Interstitial opacities diffusely on the right. More focal opacity in left retrocardiac region. No other interval changes. IMPRESSION: 1. Diffuse interstitial opacities on the right may represent asymmetric edema or developing infiltrate. More focal infiltrate is seen in the left base, similar since the previous study. Electronically Signed   By: Dorise Bullion III M.D   On: 05/06/2017 17:21     Assessment and Recommendation  82 y.o. female with acute on chronic diastolic dysfunction heart failure status post urinary tract infection sepsis and COPD exacerbation with hypoxia and anemia and new onset atrial fibrillation with rapid ventricular rate now more controlled on oral diltiazem 1. Continue oral diltiazem at this time for further risk reduction of rapid heart rate and adjust dose as necessary for goal heart rate between 60 and 90 bpm at rest 2. Full dose anticoagulation for further risk reduction in stroke with atrial fibrillation possible deep venous thrombosis 3. Continue oxygenation and supportive care for a pulmonary infection COPD and kidney infection 4. Begin ambulation as able for improvements and further adjustments of medications 5. No further cardiac diagnostics necessary at this time 6. Okay for discharge home from cardiac standpoint with follow-up in one to 2 weeks  Signed, Serafina Royals M.D. FACC

## 2017-05-13 LAB — BASIC METABOLIC PANEL
Anion gap: 7 (ref 5–15)
BUN: 47 mg/dL — AB (ref 6–20)
CHLORIDE: 98 mmol/L — AB (ref 101–111)
CO2: 31 mmol/L (ref 22–32)
CREATININE: 1.12 mg/dL — AB (ref 0.44–1.00)
Calcium: 8.7 mg/dL — ABNORMAL LOW (ref 8.9–10.3)
GFR calc Af Amer: 52 mL/min — ABNORMAL LOW (ref >60)
GFR, EST NON AFRICAN AMERICAN: 45 mL/min — AB (ref >60)
GLUCOSE: 189 mg/dL — AB (ref 65–99)
POTASSIUM: 4.4 mmol/L (ref 3.5–5.1)
SODIUM: 136 mmol/L (ref 135–145)

## 2017-05-13 LAB — CBC
HCT: 32 % — ABNORMAL LOW (ref 35.0–47.0)
Hemoglobin: 10.4 g/dL — ABNORMAL LOW (ref 12.0–16.0)
MCH: 32.3 pg (ref 26.0–34.0)
MCHC: 32.3 g/dL (ref 32.0–36.0)
MCV: 100 fL (ref 80.0–100.0)
PLATELETS: 248 10*3/uL (ref 150–440)
RBC: 3.2 MIL/uL — AB (ref 3.80–5.20)
RDW: 14.9 % — ABNORMAL HIGH (ref 11.5–14.5)
WBC: 22.1 10*3/uL — ABNORMAL HIGH (ref 3.6–11.0)

## 2017-05-13 MED ORDER — PANTOPRAZOLE SODIUM 40 MG PO TBEC
40.0000 mg | DELAYED_RELEASE_TABLET | Freq: Two times a day (BID) | ORAL | Status: DC
  Filled 2017-05-13: qty 1

## 2017-05-13 MED ORDER — PREDNISONE 20 MG PO TABS: 20.0000 mg | ORAL_TABLET | Freq: Every day | ORAL | Status: DC

## 2017-05-13 MED ORDER — PANTOPRAZOLE SODIUM 40 MG PO TBEC
40.0000 mg | DELAYED_RELEASE_TABLET | Freq: Every day | ORAL | Status: DC
  Administered 2017-05-14 – 2017-05-16 (×3): 40 mg via ORAL
  Filled 2017-05-13 (×3): qty 1

## 2017-05-13 MED ORDER — PANTOPRAZOLE SODIUM 40 MG IV SOLR
40.0000 mg | Freq: Two times a day (BID) | INTRAVENOUS | Status: AC
  Administered 2017-05-13 (×2): 40 mg via INTRAVENOUS
  Filled 2017-05-13 (×2): qty 40

## 2017-05-13 NOTE — Care Management (Signed)
Barrier- epigastric pain and elevated WBC- 22.  Discontinue steroids and start protonix

## 2017-05-13 NOTE — Care Management Important Message (Signed)
Important Message  Patient Details  Name: Kathy Guzman MRN: [id number] Date of Birth: 04/03/1935   Medicare Important Message Given:  Yes Signed IM notice given   Katrina Stack, RN 05/13/2017, 1:45 PM

## 2017-05-13 NOTE — Progress Notes (Signed)
[facility] INFECTIOUS DISE[facility] PROGRESS NOTE Date of Admission:  05/06/2017     ID: Kathy Guzman is a 82 y.o. female with UTI, sepsis Active Problems:   Respiratory failure (HCC)   Urinary tract infection without hematuria   SOB (shortness of breath)   Subjective: No fevers but wbc up to 22. Having some epigastric pain, acid brash and nausea. No diarrhea - in fact is constipated   ROS  Eleven systems are reviewed and negative except per hpi  Medications:  Antibiotics Given (last 72 hours)    Date/Time Action Medication Dose Rate   05/10/17 1431 Given   vancomycin (VANCOCIN) 50 mg/mL oral solution 125 mg 125 mg    05/10/17 1732 New Bag/Given   Ampicillin-Sulbactam (UNASYN) 3 g in sodium chloride 0.9 % 100 mL IVPB 3 g 200 mL/hr   05/10/17 1959 Given   vancomycin (VANCOCIN) 50 mg/mL oral solution 125 mg 125 mg    05/10/17 2333 New Bag/Given   Ampicillin-Sulbactam (UNASYN) 3 g in sodium chloride 0.9 % 100 mL IVPB 3 g 200 mL/hr   05/11/17 8756 Given   vancomycin (VANCOCIN) 50 mg/mL oral solution 125 mg 125 mg    05/11/17 0535 New Bag/Given   Ampicillin-Sulbactam (UNASYN) 3 g in sodium chloride 0.9 % 100 mL IVPB 3 g 200 mL/hr   05/11/17 0930 Given   vancomycin (VANCOCIN) 50 mg/mL oral solution 125 mg 125 mg    05/11/17 1129 New Bag/Given   Ampicillin-Sulbactam (UNASYN) 3 g in sodium chloride 0.9 % 100 mL IVPB 3 g 200 mL/hr   05/11/17 1326 Given   vancomycin (VANCOCIN) 50 mg/mL oral solution 125 mg 125 mg    05/11/17 2130 Given   vancomycin (VANCOCIN) 50 mg/mL oral solution 125 mg 125 mg    05/12/17 0218 Given   vancomycin (VANCOCIN) 50 mg/mL oral solution 125 mg 125 mg    05/12/17 0959 Given   vancomycin (VANCOCIN) 50 mg/mL oral solution 125 mg 125 mg    05/12/17 1330 Given   vancomycin (VANCOCIN) 50 mg/mL oral solution 125 mg 125 mg    05/12/17 2053 Given   vancomycin (VANCOCIN) 50 mg/mL oral solution 125 mg 125 mg    05/13/17 0241 Given   vancomycin (VANCOCIN) 50  mg/mL oral solution 125 mg 125 mg    05/13/17 0824 Given   vancomycin (VANCOCIN) 50 mg/mL oral solution 125 mg 125 mg      . acetaminophen  1,300 mg Oral BID  . acidophilus  2 capsule Oral TID  . apixaban  5 mg Oral BID  . chlorhexidine  15 mL Mouth Rinse BID  . Chlorhexidine Gluconate Cloth  6 each Topical Q0600  . diltiazem  240 mg Oral Daily  . fluticasone  2 spray Each Nare Daily  . ipratropium-albuterol  3 mL Nebulization TID  . levothyroxine  75 mcg Oral QAC breakfast  . mouth rinse  15 mL Mouth Rinse BID  . [START ON 05/14/2017] pantoprazole  40 mg Oral Daily  . pantoprazole (PROTONIX) IV  40 mg Intravenous Q12H  . vancomycin  125 mg Oral QID  . Vitamin D (Ergocalciferol)  50,000 Units Oral Q30 days    Objective: Vital signs in last 24 hours: Temp:  [98.2 F (36.8 C)-98.9 F (37.2 C)] 98.2 F (36.8 C) (07/12 0606) Pulse Rate:  [65-86] 86 (07/12 0606) Resp:  [20-22] 20 (07/12 0606) BP: (150-157)/(62) 150/62 (07/12 0606) SpO2:  [93 %-97 %] 93 % (07/12 0725) Constitutional:  Morbidly obese >  300#,  she is awake and interactive HENT: [place]/AT, PERRLA, no scleral icterus Mouth/Throat: Oropharynx is clear and moist. No oropharyngeal exudate.  Cardiovascular:distant  Pulmonary/Chest: poor air movement, wheeze Neck = supple, no nuchal rigidity Abdominal: Soft. Bowel sounds are normal.  exhibits no distension. Mild ttp epigastrium Lymphadenopathy: no cervical adenopathy. No axillary adenopathy Neurological: alert and interacitve.  Skin: some bruising Psychiatric: a normal mood and affect.  behavior is normal.   Lab Results  Recent Labs  05/13/17 0453  WBC 22.1*  HGB 10.4*  HCT 32.0*  NA 136  K 4.4  CL 98*  CO2 31  BUN 47*  CREATININE 1.12*    Microbiology: Results for orders placed or performed during the hospital encounter of 05/06/17  Urine Culture     Status: None   Collection Time: 05/06/17  4:10 PM  Result Value Ref Range Status   Specimen Description  URINE, CATHETERIZED  Final   Special Requests ertapenem (daily IM injections) Normal  Final   Culture   Final    NO GROWTH Performed at [facility], [place] [address], [place], [place] [address]    Report Status 05/08/2017 FINAL  Final  Culture, blood (routine x 2)     Status: None   Collection Time: 05/06/17  5:56 PM  Result Value Ref Range Status   Specimen Description BLOOD LAC  Final   Special Requests   Final    BOTTLES DRAWN AEROBIC AND ANAEROBIC Blood Culture adequate volume   Culture NO GROWTH 5 DAYS  Final   Report Status 05/11/2017 FINAL  Final  Culture, blood (routine x 2)     Status: None   Collection Time: 05/06/17  5:56 PM  Result Value Ref Range Status   Specimen Description BLOOD RFA  Final   Special Requests   Final    BOTTLES DRAWN AEROBIC AND ANAEROBIC Blood Culture adequate volume   Culture NO GROWTH 5 DAYS  Final   Report Status 05/11/2017 FINAL  Final  MRSA PCR Screening     Status: Abnormal   Collection Time: 05/06/17  9:28 PM  Result Value Ref Range Status   MRSA by PCR POSITIVE (A) NEGATIVE Final    Comment:        The GeneXpert MRSA Assay (FDA approved for NASAL specimens only), is one component of a comprehensive MRSA colonization surveillance program. It is not intended to diagnose MRSA infection nor to guide or monitor treatment for MRSA infections. RESULT CALLED TO, READ BACK BY AND VERIFIED WITH: TESS THOMAS AT 2315 ON 05/06/17 RWW   C difficile quick scan w PCR reflex     Status: Abnormal   Collection Time: 05/08/17  2:05 PM  Result Value Ref Range Status   C Diff antigen POSITIVE (A) NEGATIVE Final   C Diff toxin NEGATIVE NEGATIVE Final   C Diff interpretation Results are indeterminate. See PCR results.  Final  Clostridium Difficile by PCR     Status: Abnormal   Collection Time: 05/08/17  2:05 PM  Result Value Ref Range Status   Toxigenic C Difficile by pcr POSITIVE (A) NEGATIVE Final    Comment: Positive for toxigenic C. difficile  with little to no toxin production. Only treat if clinical presentation suggests symptomatic illness.    Studies/Results: No results found.  Assessment/Plan: Kathy Guzman is a 82 y.o. female with morbid obesity admitted with AMS, UTI, resp failure. Was on ertepenem at [facility]  CXs neg but C diff +. WBC down 11->5  but now up to 22 (did get steroids). Her diarrhea is resolved now with treatment of C diff but have nausea and epigastric pain and acid brash.    Recommendations Agree with stopping steroids, monitoring wbc in am. Treating for reflux and gastritis.  Continue oral vanco for 10 day course Thank you very much for the consult. Will follow with you.  Micaylah Bertucci P   05/13/2017, 1:46 PM

## 2017-05-13 NOTE — Progress Notes (Signed)
Patient vomited, IV phenergan given. MD made aware. Will continue to monitor.

## 2017-05-13 NOTE — Progress Notes (Signed)
[facility] [place] NAME: Kathy Guzman    MR#:  [id number]  DATE OF BIRTH:  September 12, 1935  CHIEF COMPLAINT:   Chief Complaint  Patient presents with  . Altered Mental Status   Today patient complains of burning in the epigastric area on eating with nausea. Poor appetite. Breathing is back to baseline. No diarrhea. Afebrile.  REVIEW OF SYSTEMS:   Review of Systems  Constitutional: Negative for chills and fever.  HENT: Negative for hearing loss.   Eyes: Negative for blurred vision, double vision and photophobia.  Respiratory: Negative for cough, hemoptysis and shortness of breath.   Cardiovascular: Negative for palpitations, orthopnea and leg swelling.  Gastrointestinal: Positive for heartburn and nausea. Negative for abdominal pain, diarrhea and vomiting.  Genitourinary: Negative for dysuria and urgency.  Musculoskeletal: Negative for myalgias and neck pain.  Skin: Negative for rash.  Neurological: Negative for dizziness, focal weakness, seizures, weakness and headaches.  Psychiatric/Behavioral: Negative for memory loss. The patient does not have insomnia.      DRUG ALLERGIES:   Allergies  Allergen Reactions  . Nsaids Other (See Comments)    Reaction:  Unknown   . Tramadol Itching    VITALS:  Blood pressure (!) 150/62, pulse 86, temperature 98.2 F (36.8 C), temperature source Oral, resp. rate 20, height 5\' 2"  (1.575 m), weight (!) 152.1 kg (335 lb 5.1 oz), SpO2 93 %.  PHYSICAL EXAMINATION:  GENERAL:  82 y.o.-year-old patient lying in the bed with no acute distress,morbidlly obese. EYES: Pupils equal, round, reactive to light . No scleral icterus. Extraocular muscles intact.  HEENT: Head atraumatic, normocephalic. Oropharynx and nasopharynx clear.  NECK:  Supple, no jugular venous distention. No thyroid enlargement, no tenderness.  LUNGS: Decreased breath sounds at bases., no wheezing, rales,rhonchi or crepitation. No  use of accessory muscles of respiration.  CARDIOVASCULAR: S1, S2 nirregular No murmurs, rubs, or gallops.  ABDOMEN: Slight epigastric tenderness present but no rebound tenderness.  EXTREMITIES: No pedal edema, cyanosis, or clubbing.  NEUROLOGIC: Alert, awake, oriented. Generalized weakness PSYCHIATRIC: The patient is alert , awake, oriented. SKIN: bruising on both the arm  LABORATORY PANEL:   CBC  Recent Labs Lab 05/13/17 0453  WBC 22.1*  HGB 10.4*  HCT 32.0*  PLT 248   ------------------------------------------------------------------------------------------------------------------  Chemistries   Recent Labs Lab 05/06/17 1553  05/09/17 1241 05/13/17 0453  NA 140  < >  --  136  K 4.9  < > 4.2 4.4  CL 101  < >  --  98*  CO2 34*  < >  --  31  GLUCOSE 113*  < >  --  189*  BUN 22*  < >  --  47*  CREATININE 0.96  < >  --  1.12*  CALCIUM 9.4  < >  --  8.7*  MG  --   < > 1.9  --   AST 28  --   --   --   ALT 14  --   --   --   ALKPHOS 105  --   --   --   BILITOT 0.8  --   --   --   < > = values in this interval not displayed. ------------------------------------------------------------------------------------------------------------------  Cardiac Enzymes No results for input(s): TROPONINI in the last 168 hours. ------------------------------------------------------------------------------------------------------------------  RADIOLOGY:  No results found.  EKG:   Orders placed or performed during the hospital encounter of 05/06/17  . EKG 12-Lead  . EKG  12-Lead  . EKG 12-Lead  . EKG 12-Lead  . EKG 12-Lead  . EKG 12-Lead    ASSESSMENT AND PLAN:  * C. Difficile colitis. On by mouth vancomycin. IV antibiotics stopped. Worsening of WBC today which could be due to steroids. Diarrhea improved. No abdominal pain. We will repeat labs in the morning.  * Acute on chronic hypercapnic respiratory failure due to bilateral pneumonia and COPD exacerbation Treated with IV  steroids, nebulizers and broad-spectrum antibiotics. Finished her antibiotic course and stopped by infectious disease. Change IV Solu-Medrol to oral prednisone from tomorrow. Cultures negative. Continue nebulizers as needed. On 3 status oxygen which is her baseline.  * New onset atrial fibrillation due to respiratory failure. Improved. On Cardizem. Started on Eliquis by cardiology.   * Hypoglycemia due to decreased oral intake resolved,   * recurrent UTI.  culture negative. Antibiotics stopped.  * GERD with significant burning or needing. Likely due to being on IV steroids. Stop steroids and start PPI twice a day   * hypothyroidism. Continue levothyroxine  Bed bound.  records are reviewed and case discussed with Care Management/Social Workerr. Management plans discussed with the patient, family and they are in agreement.  CODE STATUS: DNR  TOTAL TIME TAKING CARE OF THIS PATIENT:35 minutes  POSSIBLE D/C IN 1-2 DAYS, DEPENDING ON CLINICAL CONDITION.   Hillary Bow R M.D on 05/13/2017 at 11:01 AM  Between 7am to 6pm - Pager - [phone]  After 6pm go to [email or web address] - password [email or web address] [facility]  Office  [phone]  CC: Primary care physician; Leonel Ramsay, MD   Note: This dictation was prepared with Dragon dictation along with smaller phrase technology. Any transcriptional errors that result from this process are unintentional.

## 2017-05-13 NOTE — Plan of Care (Signed)
Problem: Education: Goal: Knowledge of [facility] Education information/materials will improve Outcome: Progressing Patient continue to have episodes of nausea and vomiting. Patient given PRN IV nausea medication. PPI IV given for possible gastritis.Plan of care includes continuous pulmonary monitoring, PO vanc for C-Diff infection, oxygen via nasal cannula, q 2 hr turns and all other health conditions will be monitored while in the hospital.

## 2017-05-13 NOTE — Plan of Care (Signed)
Problem: Pain Managment: Goal: General experience of comfort will improve Outcome: Not Progressing Patient still complaining of generalized discomfort and nausea. Will continue to monitor.

## 2017-05-14 LAB — HEPATIC FUNCTION PANEL
ALBUMIN: 2.8 g/dL — AB (ref 3.5–5.0)
ALK PHOS: 54 U/L (ref 38–126)
ALT: 22 U/L (ref 14–54)
AST: 30 U/L (ref 15–41)
BILIRUBIN TOTAL: 0.9 mg/dL (ref 0.3–1.2)
Bilirubin, Direct: 0.2 mg/dL (ref 0.1–0.5)
Indirect Bilirubin: 0.7 mg/dL (ref 0.3–0.9)
TOTAL PROTEIN: 5.6 g/dL — AB (ref 6.5–8.1)

## 2017-05-14 LAB — URINALYSIS, ROUTINE W REFLEX MICROSCOPIC
Bilirubin Urine: NEGATIVE
Glucose, UA: NEGATIVE mg/dL
Hgb urine dipstick: NEGATIVE
Ketones, ur: NEGATIVE mg/dL
LEUKOCYTES UA: NEGATIVE
NITRITE: NEGATIVE
PH: 6 (ref 5.0–8.0)
Protein, ur: NEGATIVE mg/dL
SPECIFIC GRAVITY, URINE: 1.013 (ref 1.005–1.030)

## 2017-05-14 LAB — CBC WITH DIFFERENTIAL/PLATELET
Basophils Absolute: 0 10*3/uL (ref 0–0.1)
Basophils Relative: 0 %
EOS PCT: 0 %
Eosinophils Absolute: 0 10*3/uL (ref 0–0.7)
HEMATOCRIT: 29 % — AB (ref 35.0–47.0)
HEMOGLOBIN: 9.6 g/dL — AB (ref 12.0–16.0)
LYMPHS ABS: 2.1 10*3/uL (ref 1.0–3.6)
LYMPHS PCT: 8 %
MCH: 32.9 pg (ref 26.0–34.0)
MCHC: 33.1 g/dL (ref 32.0–36.0)
MCV: 99.6 fL (ref 80.0–100.0)
MONOS PCT: 6 %
Monocytes Absolute: 1.6 10*3/uL — ABNORMAL HIGH (ref 0.2–0.9)
NEUTROS PCT: 86 %
Neutro Abs: 22.5 10*3/uL — ABNORMAL HIGH (ref 1.4–6.5)
Platelets: 264 10*3/uL (ref 150–440)
RBC: 2.91 MIL/uL — AB (ref 3.80–5.20)
RDW: 14.7 % — ABNORMAL HIGH (ref 11.5–14.5)
WBC: 26.2 10*3/uL — AB (ref 3.6–11.0)

## 2017-05-14 LAB — BLOOD GAS, ARTERIAL
Acid-Base Excess: 9.1 mmol/L — ABNORMAL HIGH (ref 0.0–2.0)
Bicarbonate: 34.6 mmol/L — ABNORMAL HIGH (ref 20.0–28.0)
FIO2: 0.36
O2 Saturation: 97.9 %
PCO2 ART: 51 mmHg — AB (ref 32.0–48.0)
PH ART: 7.44 (ref 7.350–7.450)
Patient temperature: 37
pO2, Arterial: 99 mmHg (ref 83.0–108.0)

## 2017-05-14 NOTE — Care Management (Signed)
WBC is more elevated.  Attending will discuss with ID.

## 2017-05-14 NOTE — Plan of Care (Signed)
Problem: Safety: Goal: Ability to remain free from injury will improve Outcome: Progressing Patient will remain free from injury and falls.  Problem: Pain Managment: Goal: General experience of comfort will improve Outcome: Progressing Patient will be free from pain and given PRN pain medication as per MD orders  Problem: Bowel/Gastric: Goal: Will not experience complications related to bowel motility Outcome: Progressing PO Vanco for C.Diff infection

## 2017-05-14 NOTE — Progress Notes (Signed)
ANTICOAGULATION CONSULT NOTE -follow up [facility] for Apixaban Indication: atrial fibrillation  Allergies  Allergen Reactions  . Nsaids Other (See Comments)    Reaction:  Unknown   . Tramadol Itching    Patient Measurements: Height: 5\' 2"  (157.5 cm) Weight: (!) 335 lb 5.1 oz (152.1 kg) IBW/kg (Calculated) : 50.1 Heparin Dosing Weight:   Vital Signs: Temp: 98.1 F (36.7 C) (07/13 0556) Temp Source: Oral (07/13 0556) BP: 108/91 (07/13 0556) Pulse Rate: 117 (07/13 0556)  Labs:  Recent Labs  05/13/17 0453 05/14/17 0518  HGB 10.4* 9.6*  HCT 32.0* 29.0*  PLT 248 264  CREATININE 1.12*  --     Estimated Creatinine Clearance: 57.5 mL/min (A) (by C-G formula based on SCr of 1.12 mg/dL (H)).   Medical History: Past Medical History:  Diagnosis Date  . Anxiety   . CHF (congestive heart failure) ([place])   . COPD (chronic obstructive pulmonary disease) ([place])   . Depression   . GERD (gastroesophageal reflux disease)   . Gout   . Hyperlipidemia   . Hypertension   . Hypothyroid   . Osteoarthritis   . Recurrent UTI   . Skin cancer   . Urethral stricture      Assessment: Pharmacy consulted to dose apixaban in this 82 year old woman with atrial fibrillation    Plan:  Continue apixaban 5 mg PO Bid.   Rylen Swindler A 05/14/2017,8:07 AM

## 2017-05-14 NOTE — Clinical Social Work Note (Signed)
CSW continuing to follow patient's progress, patient plans to return back to [facility] once she is medically ready for discharge and orders have been received.  Jones Broom. [place], MSW, [place]  05/14/2017 2:24 PM

## 2017-05-14 NOTE — Progress Notes (Signed)
[facility] [place] NAME: Kathy Guzman    MR#:  [id number]  DATE OF BIRTH:  01/14/1935  CHIEF COMPLAINT:   Chief Complaint  Patient presents with  . Altered Mental Status   Nausea and abd pain are better per patient. But she is drowzy and poor history today. Daughter at bedside. She has noticed some myoclonic jerks that she had previously when her pCO2 was up  REVIEW OF SYSTEMS:   Review of Systems  Constitutional: Negative for chills and fever.  HENT: Negative for hearing loss.   Eyes: Negative for blurred vision, double vision and photophobia.  Respiratory: Negative for cough, hemoptysis and shortness of breath.   Cardiovascular: Negative for palpitations, orthopnea and leg swelling.  Gastrointestinal: Positive for heartburn and nausea. Negative for abdominal pain, diarrhea and vomiting.  Genitourinary: Negative for dysuria and urgency.  Musculoskeletal: Negative for myalgias and neck pain.  Skin: Negative for rash.  Neurological: Negative for dizziness, focal weakness, seizures, weakness and headaches.  Psychiatric/Behavioral: Negative for memory loss. The patient does not have insomnia.    DRUG ALLERGIES:   Allergies  Allergen Reactions  . Nsaids Other (See Comments)    Reaction:  Unknown   . Tramadol Itching    VITALS:  Blood pressure (!) 108/91, pulse (!) 117, temperature 97.8 F (36.6 C), temperature source Oral, resp. rate 16, height 5\' 2"  (1.575 m), weight (!) 152.1 kg (335 lb 5.1 oz), SpO2 100 %.  PHYSICAL EXAMINATION:  GENERAL:  82 y.o.-year-old patient lying in the bed ,morbidlly obese. EYES: Pupils equal, round, reactive to light . No scleral icterus. Extraocular muscles intact.  HEENT: Head atraumatic, normocephalic. Oropharynx and nasopharynx clear.  NECK:  Supple, no jugular venous distention. No thyroid enlargement, no tenderness.  LUNGS: Decreased breath sounds at bases., no wheezing, rales,rhonchi  or crepitation. No use of accessory muscles of respiration.  CARDIOVASCULAR: S1, S2 nirregular No murmurs, rubs, or gallops.  ABDOMEN: Slight epigastric tenderness present but no rebound tenderness.  EXTREMITIES: No pedal edema, cyanosis, or clubbing.  NEUROLOGIC: Alert, awake, oriented. Generalized weakness PSYCHIATRIC: The patient is drowzy SKIN: bruising on both the arm  LABORATORY PANEL:   CBC  Recent Labs Lab 05/14/17 0518  WBC 26.2*  HGB 9.6*  HCT 29.0*  PLT 264   ------------------------------------------------------------------------------------------------------------------  Chemistries   Recent Labs Lab 05/09/17 1241 05/13/17 0453  NA  --  136  K 4.2 4.4  CL  --  98*  CO2  --  31  GLUCOSE  --  189*  BUN  --  47*  CREATININE  --  1.12*  CALCIUM  --  8.7*  MG 1.9  --    ------------------------------------------------------------------------------------------------------------------  Cardiac Enzymes No results for input(s): TROPONINI in the last 168 hours. ------------------------------------------------------------------------------------------------------------------  RADIOLOGY:  No results found.  EKG:   Orders placed or performed during the hospital encounter of 05/06/17  . EKG 12-Lead  . EKG 12-Lead  . EKG 12-Lead  . EKG 12-Lead  . EKG 12-Lead  . EKG 12-Lead    ASSESSMENT AND PLAN:  * Acute encephalopathy, likely metabolic More drowzy. Will check ABG. Critically ill. Will likely need Bipap and transfer to ICU if on Bipap  * C. Difficile colitis. On by mouth vancomycin. IV antibiotics stopped. Worsening of WBC today which could be due to steroids. Diarrhea improved. No abdominal pain. Her abd pain and nausea are better.  * Acute on chronic hypercapnic respiratory failure due to bilateral pneumonia  and COPD exacerbation Treated with IV steroids, nebulizers and broad-spectrum antibiotics. Finished her antibiotic course and stopped by  infectious disease. Steroids stopped Cultures negative. Continue nebulizers as needed. On 3 status oxygen which is her baseline.  * New onset atrial fibrillation due to respiratory failure. Improved. On Cardizem. Started on Eliquis by cardiology.   * Hypoglycemia due to decreased oral intake resolved,   * recurrent UTI.  culture negative. Antibiotics stopped.  * GERD with significant burning or needing. Likely due to being on IV steroids. Stop steroids and start PPI twice a day   * hypothyroidism. Continue levothyroxine  Bed bound.  records are reviewed and case discussed with Care Management/Social Workerr. Management plans discussed with the patient, family and they are in agreement.  CODE STATUS: DNR  TOTAL CC TIME TAKING CARE OF THIS PATIENT: 35 minutes  Hillary Bow R M.D on 05/14/2017 at 9:05 AM  Between 7am to 6pm - Pager - [phone]  After 6pm go to [email or web address] - password [email or web address] [place]  Office  [phone]  CC: Primary care physician; Leonel Ramsay, MD   Note: This dictation was prepared with Dragon dictation along with smaller phrase technology. Any transcriptional errors that result from this process are unintentional.

## 2017-05-14 NOTE — Progress Notes (Signed)
[facility] INFECTIOUS DISEASE PROGRESS NOTE Date of Admission:  05/06/2017     ID: DAWNMARIE BREON is a 82 y.o. female with UTI, sepsis Active Problems:   Respiratory failure (HCC)   Urinary tract infection without hematuria   SOB (shortness of breath)   Subjective: She has less nausea, ate 2 meals, no abd pain, no diarrhea, no skin lesions.  Denies dysuria   ROS  Eleven systems are reviewed and negative except per hpi  Medications:  Antibiotics Given (last 72 hours)    Date/Time Action Medication Dose   05/11/17 2130 Given   vancomycin (VANCOCIN) 50 mg/mL oral solution 125 mg 125 mg   05/12/17 0218 Given   vancomycin (VANCOCIN) 50 mg/mL oral solution 125 mg 125 mg   05/12/17 0959 Given   vancomycin (VANCOCIN) 50 mg/mL oral solution 125 mg 125 mg   05/12/17 1330 Given   vancomycin (VANCOCIN) 50 mg/mL oral solution 125 mg 125 mg   05/12/17 2053 Given   vancomycin (VANCOCIN) 50 mg/mL oral solution 125 mg 125 mg   05/13/17 0241 Given   vancomycin (VANCOCIN) 50 mg/mL oral solution 125 mg 125 mg   05/13/17 0824 Given   vancomycin (VANCOCIN) 50 mg/mL oral solution 125 mg 125 mg   05/13/17 1456 Given   vancomycin (VANCOCIN) 50 mg/mL oral solution 125 mg 125 mg   05/13/17 2224 Given   vancomycin (VANCOCIN) 50 mg/mL oral solution 125 mg 125 mg   05/14/17 0228 Given   vancomycin (VANCOCIN) 50 mg/mL oral solution 125 mg 125 mg   05/14/17 0914 Given   vancomycin (VANCOCIN) 50 mg/mL oral solution 125 mg 125 mg   05/14/17 1503 Given   vancomycin (VANCOCIN) 50 mg/mL oral solution 125 mg 125 mg     . acetaminophen  1,300 mg Oral BID  . acidophilus  2 capsule Oral TID  . apixaban  5 mg Oral BID  . chlorhexidine  15 mL Mouth Rinse BID  . Chlorhexidine Gluconate Cloth  6 each Topical Q0600  . diltiazem  240 mg Oral Daily  . fluticasone  2 spray Each Nare Daily  . ipratropium-albuterol  3 mL Nebulization TID  . levothyroxine  75 mcg Oral QAC breakfast  . mouth rinse  15 mL  Mouth Rinse BID  . pantoprazole  40 mg Oral Daily  . vancomycin  125 mg Oral QID  . Vitamin D (Ergocalciferol)  50,000 Units Oral Q30 days    Objective: Vital signs in last 24 hours: Temp:  [97.8 F (36.6 C)-98.4 F (36.9 C)] 97.8 F (36.6 C) (07/13 0852) Pulse Rate:  [81-145] 110 (07/13 0852) Resp:  [16] 16 (07/13 0556) BP: (108-152)/(57-119) 121/57 (07/13 0852) SpO2:  [79 %-100 %] 90 % (07/13 1346) FiO2 (%):  [34 %] 34 % (07/12 1919) Constitutional:  Morbidly obese >300#,  she is awake and interactive HENT: [place]/AT, PERRLA, no scleral icterus Mouth/Throat: Oropharynx is clear and moist. No oropharyngeal exudate.  Cardiovascular:distant  Pulmonary/Chest: poor air movement, wheeze Neck = supple, no nuchal rigidity Abdominal: Soft. Bowel sounds are normal.  exhibits no distension.nontender Lymphadenopathy: no cervical adenopathy. No axillary adenopathy Neurological: alert and interacitve.  Skin: some bruising Psychiatric: a normal mood and affect.  behavior is normal.   Lab Results  Recent Labs  05/13/17 0453 05/14/17 0518  WBC 22.1* 26.2*  HGB 10.4* 9.6*  HCT 32.0* 29.0*  NA 136  --   K 4.4  --   CL 98*  --   CO2 31  --  BUN 47*  --   CREATININE 1.12*  --     Microbiology: Results for orders placed or performed during the hospital encounter of 05/06/17  Urine Culture     Status: None   Collection Time: 05/06/17  4:10 PM  Result Value Ref Range Status   Specimen Description URINE, CATHETERIZED  Final   Special Requests ertapenem (daily IM injections) Normal  Final   Culture   Final    NO GROWTH Performed at [facility], [place] [address] [place], [place] [address]    Report Status 05/08/2017 FINAL  Final  Culture, blood (routine x 2)     Status: None   Collection Time: 05/06/17  5:56 PM  Result Value Ref Range Status   Specimen Description BLOOD LAC  Final   Special Requests   Final    BOTTLES DRAWN AEROBIC AND ANAEROBIC Blood Culture adequate volume    Culture NO GROWTH 5 DAYS  Final   Report Status 05/11/2017 FINAL  Final  Culture, blood (routine x 2)     Status: None   Collection Time: 05/06/17  5:56 PM  Result Value Ref Range Status   Specimen Description BLOOD RFA  Final   Special Requests   Final    BOTTLES DRAWN AEROBIC AND ANAEROBIC Blood Culture adequate volume   Culture NO GROWTH 5 DAYS  Final   Report Status 05/11/2017 FINAL  Final  MRSA PCR Screening     Status: Abnormal   Collection Time: 05/06/17  9:28 PM  Result Value Ref Range Status   MRSA by PCR POSITIVE (A) NEGATIVE Final    Comment:        The GeneXpert MRSA Assay (FDA approved for NASAL specimens only), is one component of a comprehensive MRSA colonization surveillance program. It is not intended to diagnose MRSA infection nor to guide or monitor treatment for MRSA infections. RESULT CALLED TO, READ BACK BY AND VERIFIED WITH: TESS THOMAS AT 2315 ON 05/06/17 RWW   C difficile quick scan w PCR reflex     Status: Abnormal   Collection Time: 05/08/17  2:05 PM  Result Value Ref Range Status   C Diff antigen POSITIVE (A) NEGATIVE Final   C Diff toxin NEGATIVE NEGATIVE Final   C Diff interpretation Results are indeterminate. See PCR results.  Final  Clostridium Difficile by PCR     Status: Abnormal   Collection Time: 05/08/17  2:05 PM  Result Value Ref Range Status   Toxigenic C Difficile by pcr POSITIVE (A) NEGATIVE Final    Comment: Positive for toxigenic C. difficile with little to no toxin production. Only treat if clinical presentation suggests symptomatic illness.    Studies/Results: No results found.  Assessment/Plan: JIALI LINNEY is a 82 y.o. female with morbid obesity admitted with AMS, UTI, resp failure. Was on ertepenem at [facility].  CXs neg here but C diff +. WBC down 11->5 but now up to 22 -> 26  (did get steroids - last dose 7/12). Her diarrhea is resolved now with treatment of C diff. Her nausea and epigastric pain and acid brash are much  improved as well.  WBC elevated still. Lfts nml. Overall she looks much better, and no obvious source of increasing wbc and did just get last dose of steroids yesterday so would hold the course and check cbc in AM  Recommendations Check I and O cath for UA and UCX.  Continue oral vanco for 10 day course Thank you very much for  the consult. Will follow with you.  Katalin Colledge P   05/14/2017, 3:15 PM

## 2017-05-15 LAB — CBC WITH DIFFERENTIAL/PLATELET
BASOS ABS: 0 10*3/uL (ref 0–0.1)
BLASTS: 0 %
Band Neutrophils: 0 %
Basophils Relative: 0 %
Eosinophils Absolute: 0.2 10*3/uL (ref 0–0.7)
Eosinophils Relative: 1 %
HEMATOCRIT: 29.3 % — AB (ref 35.0–47.0)
HEMOGLOBIN: 9.5 g/dL — AB (ref 12.0–16.0)
LYMPHS PCT: 14 %
Lymphs Abs: 3.2 10*3/uL (ref 1.0–3.6)
MCH: 32.4 pg (ref 26.0–34.0)
MCHC: 32.3 g/dL (ref 32.0–36.0)
MCV: 100.1 fL — ABNORMAL HIGH (ref 80.0–100.0)
METAMYELOCYTES PCT: 0 %
MONOS PCT: 5 %
Monocytes Absolute: 1.2 10*3/uL — ABNORMAL HIGH (ref 0.2–0.9)
Myelocytes: 0 %
Neutro Abs: 18.6 10*3/uL — ABNORMAL HIGH (ref 1.4–6.5)
Neutrophils Relative %: 80 %
OTHER: 0 %
Platelets: 248 10*3/uL (ref 150–440)
Promyelocytes Absolute: 0 %
RBC: 2.93 MIL/uL — AB (ref 3.80–5.20)
RDW: 14.6 % — ABNORMAL HIGH (ref 11.5–14.5)
WBC: 23.2 10*3/uL — AB (ref 3.6–11.0)
nRBC: 0 /100 WBC

## 2017-05-15 LAB — BASIC METABOLIC PANEL
Anion gap: 7 (ref 5–15)
BUN: 41 mg/dL — ABNORMAL HIGH (ref 6–20)
CHLORIDE: 100 mmol/L — AB (ref 101–111)
CO2: 33 mmol/L — AB (ref 22–32)
Calcium: 8.7 mg/dL — ABNORMAL LOW (ref 8.9–10.3)
Creatinine, Ser: 1.06 mg/dL — ABNORMAL HIGH (ref 0.44–1.00)
GFR calc non Af Amer: 48 mL/min — ABNORMAL LOW (ref >60)
GFR, EST AFRICAN AMERICAN: 56 mL/min — AB (ref >60)
Glucose, Bld: 127 mg/dL — ABNORMAL HIGH (ref 65–99)
POTASSIUM: 4.3 mmol/L (ref 3.5–5.1)
Sodium: 140 mmol/L (ref 135–145)

## 2017-05-15 MED ORDER — IPRATROPIUM-ALBUTEROL 0.5-2.5 (3) MG/3ML IN SOLN
3.0000 mL | Freq: Two times a day (BID) | RESPIRATORY_TRACT | Status: DC
  Administered 2017-05-15 – 2017-05-16 (×2): 3 mL via RESPIRATORY_TRACT
  Filled 2017-05-15 (×2): qty 3

## 2017-05-15 NOTE — Progress Notes (Signed)
[facility] [place] NAME: Kathy Guzman    MR#:  [id number]  DATE OF BIRTH:  1935/04/13  CHIEF COMPLAINT:  Less nausea, no abdominal pain. WBC is coming down.  Chief Complaint  Patient presents with  . Altered Mental Status     REVIEW OF SYSTEMS:   Review of Systems  Constitutional: Negative for chills and fever.  HENT: Negative for hearing loss.   Eyes: Negative for blurred vision, double vision and photophobia.  Respiratory: Negative for cough, hemoptysis and shortness of breath.   Cardiovascular: Negative for palpitations, orthopnea and leg swelling.  Gastrointestinal: Positive for heartburn and nausea. Negative for abdominal pain, diarrhea and vomiting.  Genitourinary: Negative for dysuria and urgency.  Musculoskeletal: Negative for myalgias and neck pain.  Skin: Negative for rash.  Neurological: Negative for dizziness, focal weakness, seizures, weakness and headaches.  Psychiatric/Behavioral: Negative for memory loss. The patient does not have insomnia.    DRUG ALLERGIES:   Allergies  Allergen Reactions  . Nsaids Other (See Comments)    Reaction:  Unknown   . Tramadol Itching    VITALS:  Blood pressure (!) 146/122, pulse 66, temperature 98 F (36.7 C), temperature source Oral, resp. rate 16, height 5\' 2"  (1.575 m), weight (!) 152.1 kg (335 lb 5.1 oz), SpO2 92 %.  PHYSICAL EXAMINATION:  GENERAL:  82 y.o.-year-old patient lying in the bed ,morbidlly obese. EYES: Pupils equal, round, reactive to light . No scleral icterus. Extraocular muscles intact.  HEENT: Head atraumatic, normocephalic. Oropharynx and nasopharynx clear.  NECK:  Supple, no jugular venous distention. No thyroid enlargement, no tenderness.  LUNGS: Decreased breath sounds at bases., no wheezing, rales,rhonchi or crepitation. No use of accessory muscles of respiration.  CARDIOVASCULAR: S1, S2 nirregular No murmurs, rubs, or gallops.  ABDOMEN: Slight  epigastric tenderness present but no rebound tenderness.  EXTREMITIES: No pedal edema, cyanosis, or clubbing.  NEUROLOGIC: Alert, awake, oriented. Generalized weakness PSYCHIATRIC: The patient is drowzy SKIN: bruising on both the arm  LABORATORY PANEL:   CBC  Recent Labs Lab 05/15/17 0648  WBC 23.2*  HGB 9.5*  HCT 29.3*  PLT 248   ------------------------------------------------------------------------------------------------------------------  Chemistries   Recent Labs Lab 05/09/17 1241  05/14/17 0518 05/15/17 0719  NA  --   < >  --  140  K 4.2  < >  --  4.3  CL  --   < >  --  100*  CO2  --   < >  --  33*  GLUCOSE  --   < >  --  127*  BUN  --   < >  --  41*  CREATININE  --   < >  --  1.06*  CALCIUM  --   < >  --  8.7*  MG 1.9  --   --   --   AST  --   --  30  --   ALT  --   --  22  --   ALKPHOS  --   --  54  --   BILITOT  --   --  0.9  --   < > = values in this interval not displayed. ------------------------------------------------------------------------------------------------------------------  Cardiac Enzymes No results for input(s): TROPONINI in the last 168 hours. ------------------------------------------------------------------------------------------------------------------  RADIOLOGY:  No results found.  EKG:   Orders placed or performed during the hospital encounter of 05/06/17  . EKG 12-Lead  . EKG 12-Lead  . EKG 12-Lead  .  EKG 12-Lead  . EKG 12-Lead  . EKG 12-Lead    ASSESSMENT AND PLAN:  * Acute encephalopathy, likely metabolic'; resolving patient alert, awake, oriented. ABG showed PCO2 51 only yesterday. * C. Difficile colitis. On by mouth vancomycin. IV antibiotics stopped. Worsening of WBC today which could be due to steroids. Diarrhea improved. No abdominal pain. Her abd pain and nausea are better.  * Acute on chronic hypercapnic respiratory failure due to bilateral pneumonia and COPD exacerbation Treated with IV steroids,  nebulizers and broad-spectrum antibiotics. Finished her antibiotic course and stopped by infectious disease. Steroids stopped Cultures negative. Continue nebulizers as needed. On 3 status oxygen which is her baseline.  * New onset atrial fibrillation due to respiratory failure. Improved. On Cardizem. Started on Eliquis by cardiology.   * Hypoglycemia due to decreased oral intake resolved,   * recurrent UTI.  culture negative. Antibiotics stopped. Repeat UA also is negative for infection. * GERD with significant burning or needing. Likely due to being on IV steroids. Stop steroids and start PPI twice a day   * hypothyroidism. Continue levothyroxine  Bed bound.  records are reviewed and case discussed with Care Management/Social Workerr. Management plans discussed with the patient, family and they are in agreement.  CODE STATUS: DNR  TOTAL CC TIME TAKING CARE OF THIS PATIENT: 58 minutes  Kathy Guzman M.D on 05/15/2017 at 9:03 AM  Between 7am to 6pm - Pager - [phone]  After 6pm go to [email or web address] - password [email or web address] [facility]  Office  [phone]  CC: Primary care physician; Leonel Ramsay, MD   Note: This dictation was prepared with Dragon dictation along with smaller phrase technology. Any transcriptional errors that result from this process are unintentional.

## 2017-05-16 LAB — CBC
HCT: 30.2 % — ABNORMAL LOW (ref 35.0–47.0)
Hemoglobin: 9.6 g/dL — ABNORMAL LOW (ref 12.0–16.0)
MCH: 31.6 pg (ref 26.0–34.0)
MCHC: 31.7 g/dL — ABNORMAL LOW (ref 32.0–36.0)
MCV: 99.7 fL (ref 80.0–100.0)
PLATELETS: 229 10*3/uL (ref 150–440)
RBC: 3.03 MIL/uL — AB (ref 3.80–5.20)
RDW: 15.2 % — AB (ref 11.5–14.5)
WBC: 21.3 10*3/uL — ABNORMAL HIGH (ref 3.6–11.0)

## 2017-05-16 MED ORDER — VANCOMYCIN 50 MG/ML ORAL SOLUTION: 125.0000 mg | Freq: Four times a day (QID) | ORAL | 0 refills | Status: DC

## 2017-05-16 MED ORDER — DILTIAZEM HCL ER COATED BEADS 240 MG PO CP24: 240.0000 mg | ORAL_CAPSULE | Freq: Every day | ORAL | 0 refills | Status: DC

## 2017-05-16 MED ORDER — APIXABAN 5 MG PO TABS: 5.0000 mg | ORAL_TABLET | Freq: Two times a day (BID) | ORAL | 0 refills | Status: DC

## 2017-05-16 MED ORDER — CARVEDILOL 3.125 MG PO TABS: 3.1250 mg | ORAL_TABLET | Freq: Two times a day (BID) | ORAL | 11 refills | Status: DC

## 2017-05-16 NOTE — Progress Notes (Signed)
Patient discharged to [facility] as ordered,report called to receiving facility,transported by [facility].

## 2017-05-16 NOTE — Clinical Social Work Note (Signed)
The patient will discharge today to [facility] via non-emergent EMS. The patient's daughter [name] is aware and will have her sister come with a change of clothing. The facility is aware and has received all needed documentation. The CSW will continue to follow pending additional discharge needs.  Santiago Bumpers, MSW, Latanya Presser [phone]

## 2017-05-16 NOTE — Discharge Summary (Signed)
Kathy Guzman, is a 82 y.o. female  DOB October 02, 1935  MRN [id number].  Admission date:  05/06/2017  Admitting Physician  Dustin Flock, MD  Discharge Date:  05/16/2017   Primary MD  Leonel Ramsay, MD  Recommendations for primary care physician for things to follow:   Follow up with Dr. Ola Spurr in 2 weeks  Admission Diagnosis  Delirium [R41.0] SOB (shortness of breath) [R06.02] Urinary tract infection without hematuria, site unspecified [N39.0] Acute on chronic respiratory failure with hypoxia and hypercapnia (HCC) [J96.21, J96.22]   Discharge Diagnosis  Delirium [R41.0] SOB (shortness of breath) [R06.02] Urinary tract infection without hematuria, site unspecified [N39.0] Acute on chronic respiratory failure with hypoxia and hypercapnia (HCC) [J96.21, J96.22]    Active Problems:   Respiratory failure (HCC)   Urinary tract infection without hematuria   SOB (shortness of breath)      Past Medical History:  Diagnosis Date  . Anxiety   . CHF (congestive heart failure) ([place])   . COPD (chronic obstructive pulmonary disease) ([place])   . Depression   . GERD (gastroesophageal reflux disease)   . Gout   . Hyperlipidemia   . Hypertension   . Hypothyroid   . Osteoarthritis   . Recurrent UTI   . Skin cancer   . Urethral stricture     Past Surgical History:  Procedure Laterality Date  . CHOLECYSTECTOMY    . PARTIAL HYSTERECTOMY         History of present illness and  Hospital Course:     Kindly see H&P for history of present illness and admission details, please review complete Labs, Consult reports and Test reports for all details in brief  HPI  from the history and physical done on the day of admission  82 year old female with morbid obesity, bedbound status comes from [employer] [facility] a cause of  altered mental status, found her to have hypercapnic respiratory failure.  Hospital Course  1 altered mental status secondary to hypercapnic respiratory failure due to CO2 retention: Admitted to intensive care unit, started on BiPAP. Initial blood gas showed pH 7.25, a CO2 86. She was on BiPAP for 24 hours, mental status improved. Chest x-ray showed left lower pneumonia. Pneumonia. She also received antibiotics with vancomycin, cefepime. She came to BiPAP and transferred to medical unit. Patient is breathing well, on 2 L of oxygen that she chronically uses. And she has morbid obesity and uses BiPAP at night.   #2 possible UTI: Urine cultures did not show any infection. Patient blood cultures are also negative. Patient finished 5 days of vancomycin, meropenem. So urine infection is ruled out. #3 C. difficile colitis: Patient started on by mouth vancomycin, started on July 7 and patient needs 10 day course. Patient is given 3 more days of vancomycin. #4 morbid obesity, patient had atrial fibrillation with RVR, transfer to telemetry, started on Cardizem drip, seen by cardiology, patient weaned off Cardizem drip, started on by mouth Cardizem, also started on Apixiban for anticoagulation. Patient to Coreg is decreased at 3.125 mg twice a day. She is on Cardizem CD 240 mg daily, Coreg 3.125 mg by mouth twice a day ,heart rate is 72 on telemetry today. #4 leukocytosis secondary to steroids: Improving. #6 acute gastritis and GERD: Received IV PPIs, and Zofran and nausea, abdominal pain improved   Di.scharge Condition;stable   Follow UP  Contact information for after-discharge care    [facility] .   Specialty:  Skilled Nursing Facility Contact information: [place] [phone]                Discharge Instructions  and  Discharge Medications      Allergies as of 05/16/2017      Reactions   Nsaids  Other (See Comments)   Reaction:  Unknown    Tramadol Itching      Medication List    STOP taking these medications   clonazePAM 0.5 MG tablet Commonly known as:  KLONOPIN   ertapenem IVPB Commonly known as:  INVANZ   guaiFENesin-dextromethorphan 100-10 MG/5ML syrup Commonly known as:  ROBITUSSIN DM   linezolid 600 MG tablet Commonly known as:  ZYVOX     TAKE these medications   acetaminophen 325 MG tablet Commonly known as:  TYLENOL Take 650 mg by mouth every 4 (four) hours as needed.   acetaminophen 650 MG CR tablet Commonly known as:  TYLENOL Take 1,300 mg by mouth 2 (two) times daily.   albuterol 108 (90 Base) MCG/ACT inhaler Commonly known as:  PROVENTIL HFA;VENTOLIN HFA Inhale 2 puffs into the lungs every 4 (four) hours as needed for wheezing or shortness of breath.   allopurinol 100 MG tablet Commonly known as:  ZYLOPRIM Take 100 mg by mouth daily.   alum & mag hydroxide-simeth 200-200-20 MG/5ML suspension Commonly known as:  MAALOX/MYLANTA Take 30 mLs by mouth every 4 (four) hours as needed for indigestion or heartburn.   apixaban 5 MG Tabs tablet Commonly known as:  ELIQUIS Take 1 tablet (5 mg total) by mouth 2 (two) times daily.   bisacodyl 5 MG EC tablet Commonly known as:  DULCOLAX Take 1 tablet (5 mg total) by mouth daily as needed for moderate constipation.   budesonide-formoterol 160-4.5 MCG/ACT inhaler Commonly known as:  SYMBICORT Inhale 2 puffs into the lungs 2 (two) times daily.   CALAZIME SKIN PROTECTANT EX Apply 1 application topically 4 (four) times daily as needed (for rash). Pt applies to buttocks.   carvedilol 3.125 MG tablet Commonly known as:  COREG Take 1 tablet (3.125 mg total) by mouth 2 (two) times daily. What changed:  medication strength  how much to take  when to take this   cyanocobalamin 500 MCG tablet Take 500 mcg by mouth 2 (two) times daily.   diltiazem 240 MG 24 hr capsule Commonly known as:  CARDIZEM  CD Take 1 capsule (240 mg total) by mouth daily.   docusate sodium 100 MG capsule Commonly known as:  COLACE Take 1 capsule (100 mg total) by mouth 2 (two) times daily.   feeding supplement (PRO-STAT SUGAR FREE 64) Liqd Take 30 mLs by mouth 2 (two) times daily.   ferrous sulfate 325 (65 FE) MG tablet Take 325 mg by mouth 2 (two) times daily with a meal.   fluticasone 50 MCG/ACT nasal spray Commonly known as:  FLONASE Place 2 sprays into both nostrils daily.   furosemide 20 MG tablet Commonly known as:  LASIX Take 2 tablets (40 mg total) by mouth daily. What changed:  how much to take   gabapentin 100 MG capsule Commonly known as:  NEURONTIN Take 200 mg by mouth 2 (two) times daily.   hydrOXYzine 25 MG tablet Commonly known as:  ATARAX/VISTARIL Take 25 mg by mouth every 6 (six) hours as needed.   ipratropium-albuterol 0.5-2.5 (3) MG/3ML Soln Commonly known as:  DUONEB Take 3 mLs by nebulization every 6 (six) hours.   levothyroxine 75  MCG tablet Commonly known as:  SYNTHROID, LEVOTHROID Take 75 mcg by mouth daily before breakfast.   nystatin cream Commonly known as:  MYCOSTATIN Apply 1 application topically every 8 (eight) hours as needed (for yeast). Pt applies to all skin folds.   omeprazole 20 MG capsule Commonly known as:  PRILOSEC Take 20 mg by mouth 2 (two) times daily before a meal.   PARoxetine 30 MG tablet Commonly known as:  PAXIL Take 30 mg by mouth daily.   QUEtiapine 25 MG tablet Commonly known as:  SEROQUEL Take 25 mg by mouth at bedtime.   saccharomyces boulardii 250 MG capsule Commonly known as:  FLORASTOR Take 250 mg by mouth 2 (two) times daily.   TUMS 500 MG chewable tablet Generic drug:  calcium carbonate Chew 1 tablet by mouth every 4 (four) hours as needed for indigestion or heartburn.   UTI-STAT Liqd Take 30 mLs by mouth daily.   valsartan 80 MG tablet Commonly known as:  DIOVAN Take 80 mg by mouth daily.   vancomycin 50 mg/mL  oral solution Commonly known as:  VANCOCIN Take 2.5 mLs (125 mg total) by mouth 4 (four) times daily.   Vitamin D (Ergocalciferol) 50000 units Caps capsule Commonly known as:  DRISDOL Take 50,000 Units by mouth every 30 (thirty) days.         Diet and Activity recommendation: See Discharge Instructions above   Consults obtained - ID, cardiology, critical care  Major procedures and Radiology Reports - PLEASE review detailed and final reports for all details, in brief -      Dg Chest Port 1 View  Result Date: 05/07/2017 CLINICAL DATA:  Shortness of breath EXAM: PORTABLE CHEST 1 VIEW COMPARISON:  Chest radiograph 05/06/2017 FINDINGS: Unchanged cardiomegaly and calcific aortic atherosclerosis. Left basilar consolidation and right basilar opacities is unchanged. IMPRESSION: Unchanged appearance of the chest with left basilar consolidation, cardiomegaly and right basilar opacities. Electronically Signed   By: Ulyses Jarred M.D.   On: 05/07/2017 05:52   Dg Chest Portable 1 View  Result Date: 05/06/2017 CLINICAL DATA:  Shortness of breath.  Altered mental status. EXAM: PORTABLE CHEST 1 VIEW COMPARISON:  February 09, 2016 FINDINGS: No pneumothorax. Interstitial opacities diffusely on the right. More focal opacity in left retrocardiac region. No other interval changes. IMPRESSION: 1. Diffuse interstitial opacities on the right may represent asymmetric edema or developing infiltrate. More focal infiltrate is seen in the left base, similar since the previous study. Electronically Signed   By: Dorise Bullion III M.D   On: 05/06/2017 17:21    Micro Results     Recent Results (from the past 240 hour(s))  Urine Culture     Status: None   Collection Time: 05/06/17  4:10 PM  Result Value Ref Range Status   Specimen Description URINE, CATHETERIZED  Final   Special Requests ertapenem (daily IM injections) Normal  Final   Culture   Final    NO GROWTH Performed at [facility], [address], [place], [place] [address]    Report Status 05/08/2017 FINAL  Final  Culture, blood (routine x 2)     Status: None   Collection Time: 05/06/17  5:56 PM  Result Value Ref Range Status   Specimen Description BLOOD LAC  Final   Special Requests   Final    BOTTLES DRAWN AEROBIC AND ANAEROBIC Blood Culture adequate volume   Culture NO GROWTH 5 DAYS  Final   Report Status 05/11/2017 FINAL  Final  Culture, blood (routine x 2)     Status: None   Collection Time: 05/06/17  5:56 PM  Result Value Ref Range Status   Specimen Description BLOOD RFA  Final   Special Requests   Final    BOTTLES DRAWN AEROBIC AND ANAEROBIC Blood Culture adequate volume   Culture NO GROWTH 5 DAYS  Final   Report Status 05/11/2017 FINAL  Final  MRSA PCR Screening     Status: Abnormal   Collection Time: 05/06/17  9:28 PM  Result Value Ref Range Status   MRSA by PCR POSITIVE (A) NEGATIVE Final    Comment:        The GeneXpert MRSA Assay (FDA approved for NASAL specimens only), is one component of a comprehensive MRSA colonization surveillance program. It is not intended to diagnose MRSA infection nor to guide or monitor treatment for MRSA infections. RESULT CALLED TO, READ BACK BY AND VERIFIED WITH: TESS THOMAS AT 2315 ON 05/06/17 RWW   C difficile quick scan w PCR reflex     Status: Abnormal   Collection Time: 05/08/17  2:05 PM  Result Value Ref Range Status   C Diff antigen POSITIVE (A) NEGATIVE Final   C Diff toxin NEGATIVE NEGATIVE Final   C Diff interpretation Results are indeterminate. See PCR results.  Final  Clostridium Difficile by PCR     Status: Abnormal   Collection Time: 05/08/17  2:05 PM  Result Value Ref Range Status   Toxigenic C Difficile by pcr POSITIVE (A) NEGATIVE Final    Comment: Positive for toxigenic C. difficile with little to no toxin production. Only treat if clinical presentation suggests symptomatic illness.       Today   Subjective:   Kathy Guzman today Stable for  discharge. Heart rate 72 on telemetry. Not reported in vital section.  Objective:   Blood pressure (!) 156/49, pulse (!) 125, temperature 98 F (36.7 C), temperature source Oral, resp. rate 18, height 5\' 2"  (1.575 m), weight (!) 152.1 kg (335 lb 5.1 oz), SpO2 92 %.   Intake/Output Summary (Last 24 hours) at 05/16/17 0847 Last data filed at 05/16/17 0516  Gross per 24 hour  Intake                0 ml  Output             1950 ml  Net            -1950 ml    Exam Awake Alert, Oriented x 3, No new F.N deficits, Normal affect [place].AT,PERRAL Supple Neck,No JVD, No cervical lymphadenopathy appriciated.  Symmetrical Chest wall movement, Good air movement bilaterally, CTAB RRR,No Gallops,Rubs or new Murmurs, No Parasternal Heave +ve B.Sounds, Abd Soft, Non tender, No organomegaly appriciated, No rebound -guarding or rigidity. No Cyanosis, Clubbing or edema, No new Rash or bruise  Data Review   CBC w Diff: Lab Results  Component Value Date   WBC 21.3 (H) 05/16/2017   HGB 9.6 (L) 05/16/2017   HGB 8.1 (L) 09/04/2014   HCT 30.2 (L) 05/16/2017   HCT 26.6 (L) 09/04/2014   PLT 229 05/16/2017   PLT 388 09/04/2014   LYMPHOPCT 14 05/15/2017   LYMPHOPCT 33.2 09/04/2014   BANDSPCT 0 05/15/2017   MONOPCT 5 05/15/2017   MONOPCT 8.3 09/04/2014   EOSPCT 1 05/15/2017   EOSPCT 5.8 09/04/2014   BASOPCT 0 05/15/2017   BASOPCT 0.4 09/04/2014    CMP: Lab Results  Component Value Date   NA 140  05/15/2017   NA 137 09/04/2014   K 4.3 05/15/2017   K 4.1 09/04/2014   CL 100 (L) 05/15/2017   CL 102 09/04/2014   CO2 33 (H) 05/15/2017   CO2 31 09/04/2014   BUN 41 (H) 05/15/2017   BUN 17 09/04/2014   CREATININE 1.06 (H) 05/15/2017   CREATININE 0.98 09/04/2014   PROT 5.6 (L) 05/14/2017   PROT 6.6 09/01/2014   ALBUMIN 2.8 (L) 05/14/2017   ALBUMIN 2.7 (L) 09/01/2014   BILITOT 0.9 05/14/2017   BILITOT 0.3 09/01/2014   ALKPHOS 54 05/14/2017   ALKPHOS 100 09/01/2014   AST 30 05/14/2017   AST 14  (L) 09/01/2014   ALT 22 05/14/2017   ALT 11 (L) 09/01/2014  .   Total Time in preparing paper work, data evaluation and todays exam - 78 minutes  Vora Clover M.D on 05/16/2017 at 8:47 AM    Note: This dictation was prepared with Dragon dictation along with smaller phrase technology. Any transcriptional errors that result from this process are unintentional.

## 2017-06-30 ENCOUNTER — Inpatient Hospital Stay
Admission: EM | Admit: 2017-06-30 | Discharge: 2017-07-05 | DRG: 190 | Disposition: A | Discharge status: Skilled Nursing Facility | Payer: Medicare Other | Attending: Internal Medicine | Admitting: Internal Medicine

## 2017-06-30 ENCOUNTER — Emergency Department: Payer: Medicare Other

## 2017-06-30 DIAGNOSIS — A0472 Enterocolitis due to Clostridium difficile, not specified as recurrent: Secondary | ICD-10-CM | POA: Diagnosis present

## 2017-06-30 DIAGNOSIS — Z87891 Personal history of nicotine dependence: Secondary | ICD-10-CM

## 2017-06-30 DIAGNOSIS — R748 Abnormal levels of other serum enzymes: Secondary | ICD-10-CM | POA: Diagnosis present

## 2017-06-30 DIAGNOSIS — E785 Hyperlipidemia, unspecified: Secondary | ICD-10-CM | POA: Diagnosis present

## 2017-06-30 DIAGNOSIS — G9341 Metabolic encephalopathy: Secondary | ICD-10-CM | POA: Diagnosis present

## 2017-06-30 DIAGNOSIS — R7989 Other specified abnormal findings of blood chemistry: Secondary | ICD-10-CM

## 2017-06-30 DIAGNOSIS — Z79899 Other long term (current) drug therapy: Secondary | ICD-10-CM

## 2017-06-30 DIAGNOSIS — J189 Pneumonia, unspecified organism: Secondary | ICD-10-CM

## 2017-06-30 DIAGNOSIS — N184 Chronic kidney disease, stage 4 (severe): Secondary | ICD-10-CM | POA: Diagnosis present

## 2017-06-30 DIAGNOSIS — K921 Melena: Secondary | ICD-10-CM | POA: Diagnosis not present

## 2017-06-30 DIAGNOSIS — I130 Hypertensive heart and chronic kidney disease with heart failure and stage 1 through stage 4 chronic kidney disease, or unspecified chronic kidney disease: Secondary | ICD-10-CM | POA: Diagnosis present

## 2017-06-30 DIAGNOSIS — Z7901 Long term (current) use of anticoagulants: Secondary | ICD-10-CM | POA: Diagnosis not present

## 2017-06-30 DIAGNOSIS — I5032 Chronic diastolic (congestive) heart failure: Secondary | ICD-10-CM | POA: Diagnosis present

## 2017-06-30 DIAGNOSIS — Z7189 Other specified counseling: Secondary | ICD-10-CM

## 2017-06-30 DIAGNOSIS — J9601 Acute respiratory failure with hypoxia: Secondary | ICD-10-CM | POA: Diagnosis not present

## 2017-06-30 DIAGNOSIS — G629 Polyneuropathy, unspecified: Secondary | ICD-10-CM | POA: Diagnosis present

## 2017-06-30 DIAGNOSIS — Z515 Encounter for palliative care: Secondary | ICD-10-CM | POA: Diagnosis present

## 2017-06-30 DIAGNOSIS — E662 Morbid (severe) obesity with alveolar hypoventilation: Secondary | ICD-10-CM | POA: Diagnosis present

## 2017-06-30 DIAGNOSIS — E872 Acidosis: Secondary | ICD-10-CM | POA: Diagnosis present

## 2017-06-30 DIAGNOSIS — Z7401 Bed confinement status: Secondary | ICD-10-CM

## 2017-06-30 DIAGNOSIS — F419 Anxiety disorder, unspecified: Secondary | ICD-10-CM | POA: Diagnosis present

## 2017-06-30 DIAGNOSIS — R778 Other specified abnormalities of plasma proteins: Secondary | ICD-10-CM

## 2017-06-30 DIAGNOSIS — J441 Chronic obstructive pulmonary disease with (acute) exacerbation: Principal | ICD-10-CM | POA: Diagnosis present

## 2017-06-30 DIAGNOSIS — Z6841 Body Mass Index (BMI) 40.0 and over, adult: Secondary | ICD-10-CM

## 2017-06-30 DIAGNOSIS — R4182 Altered mental status, unspecified: Secondary | ICD-10-CM

## 2017-06-30 DIAGNOSIS — Z66 Do not resuscitate: Secondary | ICD-10-CM | POA: Diagnosis present

## 2017-06-30 DIAGNOSIS — M1990 Unspecified osteoarthritis, unspecified site: Secondary | ICD-10-CM | POA: Diagnosis present

## 2017-06-30 DIAGNOSIS — M109 Gout, unspecified: Secondary | ICD-10-CM | POA: Diagnosis present

## 2017-06-30 DIAGNOSIS — J9602 Acute respiratory failure with hypercapnia: Secondary | ICD-10-CM | POA: Diagnosis not present

## 2017-06-30 DIAGNOSIS — F329 Major depressive disorder, single episode, unspecified: Secondary | ICD-10-CM | POA: Diagnosis present

## 2017-06-30 DIAGNOSIS — E039 Hypothyroidism, unspecified: Secondary | ICD-10-CM | POA: Diagnosis present

## 2017-06-30 DIAGNOSIS — J9622 Acute and chronic respiratory failure with hypercapnia: Secondary | ICD-10-CM | POA: Diagnosis present

## 2017-06-30 DIAGNOSIS — K219 Gastro-esophageal reflux disease without esophagitis: Secondary | ICD-10-CM | POA: Diagnosis present

## 2017-06-30 DIAGNOSIS — I482 Chronic atrial fibrillation: Secondary | ICD-10-CM | POA: Diagnosis present

## 2017-06-30 DIAGNOSIS — Z85828 Personal history of other malignant neoplasm of skin: Secondary | ICD-10-CM | POA: Diagnosis not present

## 2017-06-30 DIAGNOSIS — I959 Hypotension, unspecified: Secondary | ICD-10-CM | POA: Diagnosis present

## 2017-06-30 HISTORY — DX: Unspecified atrial fibrillation: I48.91

## 2017-06-30 LAB — URINALYSIS, COMPLETE (UACMP) WITH MICROSCOPIC
BACTERIA UA: NONE SEEN
BILIRUBIN URINE: NEGATIVE
Glucose, UA: NEGATIVE mg/dL
HGB URINE DIPSTICK: NEGATIVE
Ketones, ur: NEGATIVE mg/dL
Leukocytes, UA: NEGATIVE
NITRITE: NEGATIVE
PH: 5 (ref 5.0–8.0)
Protein, ur: NEGATIVE mg/dL
RBC / HPF: NONE SEEN RBC/hpf (ref 0–5)
SPECIFIC GRAVITY, URINE: 1.017 (ref 1.005–1.030)
WBC UA: NONE SEEN WBC/hpf (ref 0–5)

## 2017-06-30 LAB — CBC
HCT: 30.5 % — ABNORMAL LOW (ref 35.0–47.0)
Hemoglobin: 9.5 g/dL — ABNORMAL LOW (ref 12.0–16.0)
MCH: 32.6 pg (ref 26.0–34.0)
MCHC: 31.2 g/dL — ABNORMAL LOW (ref 32.0–36.0)
MCV: 104.3 fL — ABNORMAL HIGH (ref 80.0–100.0)
PLATELETS: 269 10*3/uL (ref 150–440)
RBC: 2.93 MIL/uL — AB (ref 3.80–5.20)
RDW: 16.3 % — ABNORMAL HIGH (ref 11.5–14.5)
WBC: 10.9 10*3/uL (ref 3.6–11.0)

## 2017-06-30 LAB — BASIC METABOLIC PANEL
Anion gap: 9 (ref 5–15)
BUN: 48 mg/dL — ABNORMAL HIGH (ref 6–20)
CHLORIDE: 100 mmol/L — AB (ref 101–111)
CO2: 29 mmol/L (ref 22–32)
CREATININE: 2.09 mg/dL — AB (ref 0.44–1.00)
Calcium: 9 mg/dL (ref 8.9–10.3)
GFR calc non Af Amer: 21 mL/min — ABNORMAL LOW (ref >60)
GFR, EST AFRICAN AMERICAN: 25 mL/min — AB (ref >60)
Glucose, Bld: 142 mg/dL — ABNORMAL HIGH (ref 65–99)
POTASSIUM: 4.8 mmol/L (ref 3.5–5.1)
SODIUM: 138 mmol/L (ref 135–145)

## 2017-06-30 LAB — APTT: aPTT: 40 seconds — ABNORMAL HIGH (ref 24–36)

## 2017-06-30 LAB — GLUCOSE, CAPILLARY: Glucose-Capillary: 135 mg/dL — ABNORMAL HIGH (ref 65–99)

## 2017-06-30 LAB — HEPATIC FUNCTION PANEL
ALBUMIN: 2.8 g/dL — AB (ref 3.5–5.0)
ALT: 11 U/L — ABNORMAL LOW (ref 14–54)
AST: 12 U/L — ABNORMAL LOW (ref 15–41)
Alkaline Phosphatase: 118 U/L (ref 38–126)
BILIRUBIN TOTAL: 0.5 mg/dL (ref 0.3–1.2)
Total Protein: 6.3 g/dL — ABNORMAL LOW (ref 6.5–8.1)

## 2017-06-30 LAB — PROTIME-INR
INR: 1.74
PROTHROMBIN TIME: 20.2 s — AB (ref 11.4–15.2)

## 2017-06-30 LAB — PROCALCITONIN

## 2017-06-30 LAB — ABO/RH: ABO/RH(D): A POS

## 2017-06-30 LAB — TROPONIN I: TROPONIN I: 0.04 ng/mL — AB (ref <0.03)

## 2017-06-30 LAB — BRAIN NATRIURETIC PEPTIDE: B Natriuretic Peptide: 370 pg/mL — ABNORMAL HIGH (ref 0.0–100.0)

## 2017-06-30 LAB — MRSA PCR SCREENING: MRSA by PCR: POSITIVE — AB

## 2017-06-30 LAB — PREPARE RBC (CROSSMATCH)

## 2017-06-30 MED ORDER — ALLOPURINOL 100 MG PO TABS
100.0000 mg | ORAL_TABLET | Freq: Every day | ORAL | Status: DC
  Administered 2017-07-01 – 2017-07-05 (×4): 100 mg via ORAL
  Filled 2017-06-30 (×5): qty 1

## 2017-06-30 MED ORDER — FUROSEMIDE 10 MG/ML IJ SOLN
40.0000 mg | Freq: Once | INTRAMUSCULAR | Status: AC
  Administered 2017-06-30: 40 mg via INTRAVENOUS
  Filled 2017-06-30: qty 4

## 2017-06-30 MED ORDER — SODIUM CHLORIDE 0.9 % IV SOLN
8.0000 mg/h | INTRAVENOUS | Status: DC
  Administered 2017-06-30 – 2017-07-01 (×2): 8 mg/h via INTRAVENOUS
  Filled 2017-06-30 (×2): qty 80

## 2017-06-30 MED ORDER — SACCHAROMYCES BOULARDII 250 MG PO CAPS
250.0000 mg | ORAL_CAPSULE | Freq: Two times a day (BID) | ORAL | Status: DC
  Administered 2017-07-01 – 2017-07-05 (×8): 250 mg via ORAL
  Filled 2017-06-30 (×11): qty 1

## 2017-06-30 MED ORDER — PIPERACILLIN-TAZOBACTAM 3.375 G IVPB: 3.3750 g | Freq: Three times a day (TID) | INTRAVENOUS | Status: DC

## 2017-06-30 MED ORDER — FLUTICASONE PROPIONATE 50 MCG/ACT NA SUSP
2.0000 | Freq: Every day | NASAL | Status: DC
  Administered 2017-07-01 – 2017-07-04 (×3): 2 via NASAL
  Filled 2017-06-30: qty 16

## 2017-06-30 MED ORDER — VANCOMYCIN HCL 10 G IV SOLR
2000.0000 mg | Freq: Once | INTRAVENOUS | Status: AC
  Administered 2017-06-30: 2000 mg via INTRAVENOUS
  Filled 2017-06-30: qty 2000

## 2017-06-30 MED ORDER — SODIUM CHLORIDE 0.9 % IV SOLN
80.0000 mg | Freq: Once | INTRAVENOUS | Status: AC
  Administered 2017-06-30: 20:00:00 80 mg via INTRAVENOUS
  Filled 2017-06-30: qty 80

## 2017-06-30 MED ORDER — HYDROXYZINE HCL 25 MG PO TABS
25.0000 mg | ORAL_TABLET | Freq: Four times a day (QID) | ORAL | Status: DC | PRN
  Administered 2017-07-01 – 2017-07-05 (×2): 25 mg via ORAL
  Filled 2017-06-30 (×4): qty 1

## 2017-06-30 MED ORDER — NYSTATIN 100000 UNIT/GM EX CREA
1.0000 "application " | TOPICAL_CREAM | Freq: Three times a day (TID) | CUTANEOUS | Status: DC | PRN
  Filled 2017-06-30: qty 15

## 2017-06-30 MED ORDER — IPRATROPIUM-ALBUTEROL 0.5-2.5 (3) MG/3ML IN SOLN
3.0000 mL | Freq: Four times a day (QID) | RESPIRATORY_TRACT | Status: DC
  Administered 2017-07-01 (×3): 3 mL via RESPIRATORY_TRACT
  Filled 2017-06-30 (×4): qty 3

## 2017-06-30 MED ORDER — DILTIAZEM HCL ER COATED BEADS 120 MG PO CP24
240.0000 mg | ORAL_CAPSULE | Freq: Every day | ORAL | Status: DC
  Administered 2017-07-01 – 2017-07-03 (×3): 240 mg via ORAL
  Filled 2017-06-30: qty 2
  Filled 2017-06-30 (×3): qty 1

## 2017-06-30 MED ORDER — SODIUM CHLORIDE 0.9 % IV SOLN: 1250.0000 mg | INTRAVENOUS | Status: DC

## 2017-06-30 MED ORDER — PRO-STAT SUGAR FREE PO LIQD
30.0000 mL | Freq: Two times a day (BID) | ORAL | Status: DC
  Administered 2017-07-01 – 2017-07-05 (×7): 30 mL via ORAL

## 2017-06-30 MED ORDER — VANCOMYCIN HCL 10 G IV SOLR
2000.0000 mg | Freq: Once | INTRAVENOUS | Status: DC
  Filled 2017-06-30: qty 2000

## 2017-06-30 MED ORDER — AZITHROMYCIN 500 MG PO TABS
250.0000 mg | ORAL_TABLET | Freq: Every day | ORAL | Status: DC
  Administered 2017-07-01: 250 mg via ORAL
  Filled 2017-06-30 (×2): qty 1

## 2017-06-30 MED ORDER — CARVEDILOL 3.125 MG PO TABS
3.1250 mg | ORAL_TABLET | Freq: Two times a day (BID) | ORAL | Status: DC
  Administered 2017-07-01 – 2017-07-03 (×4): 3.125 mg via ORAL
  Filled 2017-06-30 (×7): qty 1

## 2017-06-30 MED ORDER — PIPERACILLIN-TAZOBACTAM 3.375 G IVPB 30 MIN
3.3750 g | Freq: Once | INTRAVENOUS | Status: AC
  Administered 2017-06-30: 3.375 g via INTRAVENOUS

## 2017-06-30 MED ORDER — SODIUM CHLORIDE 0.9 % IV SOLN: 10.0000 mL/h | Freq: Once | INTRAVENOUS | Status: AC

## 2017-06-30 MED ORDER — FUROSEMIDE 20 MG PO TABS
40.0000 mg | ORAL_TABLET | Freq: Two times a day (BID) | ORAL | Status: DC
  Administered 2017-07-01 (×2): 40 mg via ORAL
  Filled 2017-06-30 (×2): qty 2

## 2017-06-30 MED ORDER — AZITHROMYCIN 500 MG PO TABS
500.0000 mg | ORAL_TABLET | Freq: Every day | ORAL | Status: AC
  Administered 2017-06-30: 500 mg via ORAL
  Filled 2017-06-30: qty 1

## 2017-06-30 MED ORDER — PANTOPRAZOLE SODIUM 40 MG IV SOLR: 40.0000 mg | Freq: Two times a day (BID) | INTRAVENOUS | Status: DC

## 2017-06-30 MED ORDER — PAROXETINE HCL 30 MG PO TABS
30.0000 mg | ORAL_TABLET | Freq: Every day | ORAL | Status: DC
  Administered 2017-07-01 – 2017-07-05 (×5): 30 mg via ORAL
  Filled 2017-06-30 (×5): qty 1

## 2017-06-30 MED ORDER — ACETAMINOPHEN 650 MG RE SUPP: 650.0000 mg | Freq: Four times a day (QID) | RECTAL | Status: DC | PRN

## 2017-06-30 MED ORDER — LEVOTHYROXINE SODIUM 75 MCG PO TABS
75.0000 ug | ORAL_TABLET | Freq: Every day | ORAL | Status: DC
  Administered 2017-07-01 – 2017-07-05 (×5): 75 ug via ORAL
  Filled 2017-06-30 (×3): qty 1
  Filled 2017-06-30: qty 3
  Filled 2017-06-30: qty 1
  Filled 2017-06-30 (×2): qty 3
  Filled 2017-06-30: qty 1

## 2017-06-30 MED ORDER — MOMETASONE FURO-FORMOTEROL FUM 200-5 MCG/ACT IN AERO
2.0000 | INHALATION_SPRAY | Freq: Two times a day (BID) | RESPIRATORY_TRACT | Status: DC
  Administered 2017-07-01 – 2017-07-05 (×9): 2 via RESPIRATORY_TRACT
  Filled 2017-06-30: qty 8.8

## 2017-06-30 MED ORDER — QUETIAPINE FUMARATE 25 MG PO TABS
25.0000 mg | ORAL_TABLET | Freq: Every day | ORAL | Status: DC
  Administered 2017-06-30 – 2017-07-04 (×5): 25 mg via ORAL
  Filled 2017-06-30 (×5): qty 1

## 2017-06-30 MED ORDER — ACETAMINOPHEN 325 MG PO TABS: 650.0000 mg | ORAL_TABLET | Freq: Four times a day (QID) | ORAL | Status: DC | PRN

## 2017-06-30 MED ORDER — METHYLPREDNISOLONE SODIUM SUCC 40 MG IJ SOLR
40.0000 mg | Freq: Every day | INTRAMUSCULAR | Status: DC
  Administered 2017-06-30: 40 mg via INTRAVENOUS
  Filled 2017-06-30: qty 1

## 2017-06-30 NOTE — ED Notes (Signed)
External female catheter placed on pt and hooked up to suction

## 2017-06-30 NOTE — ED Notes (Signed)
X-ray at bedside

## 2017-06-30 NOTE — ED Notes (Signed)
Pt falls asleep while talking. When pt falls asleep oxygen levels drop. Raised oxygen to 4 L nasal cannula.

## 2017-06-30 NOTE — H&P (Signed)
[facility] NAME: Kathy Guzman    MR#:  [id number]  DATE OF BIRTH:  November 04, 1934  DATE OF ADMISSION:  06/30/2017  PRIMARY CARE PHYSICIAN: Leonel Ramsay, MD   REQUESTING/REFERRING PHYSICIAN: Dr Mariea Clonts  CHIEF COMPLAINT:   Chief Complaint  Patient presents with  . COPD  . Altered Mental Status    HISTORY OF PRESENT ILLNESS:  Kathy Guzman  is a 82 y.o. female brought in with altered mental status. The patient was on them all to be woken up for breakfast or lunch. When family got there at 21 PM she still was unable to be woken up. She has been confused. The patient was placed on BiPAP and is starting to talk more at this point. The patient states that she feels okay and offered no complaints. Hospitalist services were contacted for admission. Patient also having black stools for a while as per family.  PAST MEDICAL HISTORY:   Past Medical History:  Diagnosis Date  . Anxiety   . Atrial fibrillation ([place])   . CHF (congestive heart failure) ([place])   . COPD (chronic obstructive pulmonary disease) ([place])   . Depression   . GERD (gastroesophageal reflux disease)   . Gout   . Hyperlipidemia   . Hypertension   . Hypothyroid   . Osteoarthritis   . Recurrent UTI   . Skin cancer   . Urethral stricture     PAST SURGICAL HISTORY:   Past Surgical History:  Procedure Laterality Date  . arm surgery    . bladder tack    . CHOLECYSTECTOMY    . PARTIAL HYSTERECTOMY      SOCIAL HISTORY:   Social History  Substance Use Topics  . Smoking status: Former Smoker    Packs/day: 0.25    Quit date: 03/18/1994  . Smokeless tobacco: Never Used  . Alcohol use No    FAMILY HISTORY:   Family History  Problem Relation Age of Onset  . Colon cancer Brother   . Heart attack Father   . CVA Mother     DRUG ALLERGIES:   Allergies  Allergen Reactions  . Nsaids Other (See Comments)    Reaction:  Unknown   . Tramadol Itching     REVIEW OF SYSTEMS:  Limited with altered mental status. Answered no to most questions. No chest pain, no abdominal pain, no shortness of breath, no nausea or vomiting.  MEDICATIONS AT HOME:   Prior to Admission medications   Medication Sig Start Date End Date Taking? Authorizing Provider  acetaminophen (TYLENOL) 325 MG tablet Take 650 mg by mouth every 4 (four) hours as needed.   Yes [provider]  albuterol (PROVENTIL HFA;VENTOLIN HFA) 108 (90 BASE) MCG/ACT inhaler Inhale 2 puffs into the lungs every 4 (four) hours as needed for wheezing or shortness of breath.   Yes [provider]  allopurinol (ZYLOPRIM) 100 MG tablet Take 100 mg by mouth daily.    Yes [provider]  alum & mag hydroxide-simeth (MAALOX/MYLANTA) 200-200-20 MG/5ML suspension Take 30 mLs by mouth every 4 (four) hours as needed for indigestion or heartburn.   Yes [provider]  apixaban (ELIQUIS) 5 MG TABS tablet Take 1 tablet (5 mg total) by mouth 2 (two) times daily. 05/16/17  Yes Epifanio Lesches, MD  bisacodyl (DULCOLAX) 5 MG EC tablet Take 1 tablet (5 mg total) by mouth daily as needed for moderate constipation. 02/11/16  Yes Nicholes Mango, MD  budesonide-formoterol (  SYMBICORT) 160-4.5 MCG/ACT inhaler Inhale 2 puffs into the lungs 2 (two) times daily.   Yes [provider]  calcium carbonate (TUMS) 500 MG chewable tablet Chew 1 tablet by mouth every 4 (four) hours as needed for indigestion or heartburn.   Yes [provider]  carvedilol (COREG) 3.125 MG tablet Take 1 tablet (3.125 mg total) by mouth 2 (two) times daily. 05/16/17 05/16/18 Yes Epifanio Lesches, MD  cyanocobalamin 500 MCG tablet Take 500 mcg by mouth 2 (two) times daily.   Yes [provider]  diltiazem (CARDIZEM CD) 240 MG 24 hr capsule Take 1 capsule (240 mg total) by mouth daily. 05/16/17  Yes Epifanio Lesches, MD  docusate sodium (COLACE) 100 MG capsule Take 1 capsule (100 mg  total) by mouth 2 (two) times daily. 02/11/16  Yes Gouru, Illene Silver, MD  ferrous sulfate 325 (65 FE) MG tablet Take 325 mg by mouth 2 (two) times daily with a meal.    Yes [provider]  fluticasone (FLONASE) 50 MCG/ACT nasal spray Place 2 sprays into both nostrils daily. 01/13/16  Yes Dustin Flock, MD  furosemide (LASIX) 20 MG tablet Take 2 tablets (40 mg total) by mouth daily. Patient taking differently: Take 40 mg by mouth 2 (two) times daily.  01/13/16  Yes Dustin Flock, MD  gabapentin (NEURONTIN) 100 MG capsule Take 200 mg by mouth 2 (two) times daily.    Yes [provider]  hydrOXYzine (ATARAX/VISTARIL) 25 MG tablet Take 25 mg by mouth every 6 (six) hours as needed.   Yes [provider]  ipratropium-albuterol (DUONEB) 0.5-2.5 (3) MG/3ML SOLN Take 3 mLs by nebulization every 6 (six) hours. 01/13/16  Yes Dustin Flock, MD  levothyroxine (SYNTHROID, LEVOTHROID) 75 MCG tablet Take 75 mcg by mouth daily before breakfast.   Yes [provider]  losartan (COZAAR) 50 MG tablet Take 50 mg by mouth daily.   Yes [provider]  nystatin cream (MYCOSTATIN) Apply 1 application topically every 8 (eight) hours as needed (for yeast). Pt applies to all skin folds.   Yes [provider]  omeprazole (PRILOSEC) 20 MG capsule Take 20 mg by mouth daily.    Yes [provider]  PARoxetine (PAXIL) 30 MG tablet Take 30 mg by mouth daily.   Yes [provider]  QUEtiapine (SEROQUEL) 25 MG tablet Take 25 mg by mouth at bedtime.    Yes [provider]  saccharomyces boulardii (FLORASTOR) 250 MG capsule Take 250 mg by mouth 2 (two) times daily.   Yes [provider]  Skin Protectants, Misc. (CALAZIME SKIN PROTECTANT EX) Apply 1 application topically 4 (four) times daily as needed (for rash). Pt applies to buttocks.   Yes [provider]  Vitamin D, Ergocalciferol, (DRISDOL) 50000 units CAPS capsule Take 50,000 Units by  mouth every 30 (thirty) days.   Yes [provider]  Amino Acids-Protein Hydrolys (FEEDING SUPPLEMENT, PRO-STAT SUGAR FREE 64,) LIQD Take 30 mLs by mouth 2 (two) times daily. 02/11/16   Gouru, Illene Silver, MD  Cranberry-Vitamin C-Inulin (UTI-STAT) LIQD Take 30 mLs by mouth daily.    [provider]  valsartan (DIOVAN) 80 MG tablet Take 80 mg by mouth daily.     [provider]      VITAL SIGNS:  Blood pressure 110/80, pulse (!) 56, temperature 97.7 F (36.5 C), temperature source Oral, resp. rate 13, weight (!) 161.3 kg (355 lb 8 oz), SpO2 96 %.  PHYSICAL EXAMINATION:  GENERAL:  82 y.o.-year-old patient lying  in the bed On BiPAP EYES: Pupils equal, round, reactive to light and accommodation. No scleral icterus.  HEENT: Head atraumatic, normocephalic. Oropharynx and nasopharynx clear.  NECK:  Supple, no jugular venous distention. No thyroid enlargement, no tenderness.  LUNGS: Decreased breath sounds bilaterally, no wheezing, rales,rhonchi or crepitation. No use of accessory muscles of respiration.  CARDIOVASCULAR: S1, S2 normal. No murmurs, rubs, or gallops.  ABDOMEN: Soft, nontender, nondistended. Bowel sounds present. No organomegaly or mass.  EXTREMITIES: 3+ edema, no cyanosis, or clubbing.  NEUROLOGIC: Limited with altered mental status PSYCHIATRIC: The patient is alert and answered only a few yes or no questions.  SKIN: No rash, lesion, or ulcer anteriorly  LABORATORY PANEL:   CBC  Recent Labs Lab 06/30/17 1832  WBC 10.9  HGB 9.5*  HCT 30.5*  PLT 269   ------------------------------------------------------------------------------------------------------------------  Chemistries   Recent Labs Lab 06/30/17 1832  NA 138  K 4.8  CL 100*  CO2 29  GLUCOSE 142*  BUN 48*  CREATININE 2.09*  CALCIUM 9.0   ------------------------------------------------------------------------------------------------------------------  Cardiac Enzymes  Recent  Labs Lab 06/30/17 1832  TROPONINI 0.04*   ------------------------------------------------------------------------------------------------------------------  RADIOLOGY:  Dg Chest 1 View  Result Date: 06/30/2017 CLINICAL DATA:  Lethargy, COPD EXAM: CHEST 1 VIEW COMPARISON:  05/07/2017 FINDINGS: Cardiomegaly with atherosclerosis. Patchy atelectasis at the right base. No focal consolidation. No large effusion. No pneumothorax. Left basilar opacity unchanged. IMPRESSION: 1. No change in cardiomegaly. 2. Patchy left greater than right bibasilar opacity, may reflect atelectasis, no change from prior Electronically Signed   By: Donavan Foil M.D.   On: 06/30/2017 19:04    EKG:   Sinus rhythm 60 bpm, left bundle-branch block  IMPRESSION AND PLAN:   1. Acute hypercarbic respiratory failure and respiratory acidosis. BiPAP in order to blow off CO2. Case discussed with E link physician on call. Patient will be admitted to the critical care unit. Patient is a DO NOT RESUSCITATE. 2. Upper GI bleed with melena. Serial hemoglobins. ER physician ordered for a blood transfusion. Patient placed on Protonix drip. GI consult. Hold Eliquis. 3. COPD exacerbation. Start Solu-Medrol and nebulizer treatments 4. Questionable pneumonia. Send off pro calcitonin. And probably stop antibiotics if this is negative. On vancomycin and Zosyn. 5. Relative hypotension hold antihypertensive medications except for rate controlling medications at this point. 6. Atrial fibrillation. Continue Coreg and Cardizem if blood pressure allows. 7. History of chronic diastolic congestive heart failure. Continue Lasix if blood pressure allows. Right now no signs of heart failure. 8. Morbid obesity 9. Hypothyroidism unspecified on levothyroxine 10. Chronic kidney disease stage IV. Continue to monitor.    All the records are reviewed and case discussed with ED provider. Management plans discussed with the patient, family and they are in  agreement.  CODE STATUS: DO NOT RESUSCITATE  TOTAL TIME TAKING CARE OF THIS PATIENT: 55 minutes. Case discussed with E Link physician that the patient be made it to the critical care unit.   Loletha Grayer M.D on 06/30/2017 at 9:08 PM  Between 7am to 6pm - Pager - [phone]  After 6pm call admission pager [phone]  [facility]  [phone]  CC: Primary care physician; Leonel Ramsay, MD

## 2017-06-30 NOTE — ED Notes (Signed)
Pt unable to sign consent form. Signature went all across page and down. Pt stated "I can't see it". Pt asked if family could sign when they get back to room.

## 2017-06-30 NOTE — ED Provider Notes (Addendum)
[facility] Emergency Department Provider Note  ____________________________________________  Time seen: Approximately 7:05 PM  I have reviewed the triage vital signs and the nursing notes.   HISTORY  Chief Complaint COPD and Altered Mental Status  The patient's history is limited due to her somnolence and inability to give details as well as altered mental status.  HPI Kathy Guzman is a 82 y.o. female from [facility] with a history of COPD, CHF, HTN, HL and recurrent UTI presenting for altered mental status and hypotension. Per report, the patient was brought here today for decreased responsiveness and a systolic blood pressure of 104 which is relatively low for her. EMS noted the patient's O2 sats to be in the high 80s, which resolved with 6 L nasal cannula. The patient denies any pain, although she stated to the nurses that she was having abdominal pain, she denies this to me. The patient was unable to give any further details, and thought she was at home.   Past Medical History:  Diagnosis Date  . Anxiety   . CHF (congestive heart failure) ([place])   . COPD (chronic obstructive pulmonary disease) ([place])   . Depression   . GERD (gastroesophageal reflux disease)   . Gout   . Hyperlipidemia   . Hypertension   . Hypothyroid   . Osteoarthritis   . Recurrent UTI   . Skin cancer   . Urethral stricture     Patient Active Problem List   Diagnosis Date Noted  . Respiratory failure ([place]) 05/06/2017  . Urinary tract infection without hematuria   . SOB (shortness of breath)   . Chronic respiratory failure ([place]) 02/24/2016  . Pressure ulcer 02/08/2016  . Sepsis ([place]) 02/07/2016  . Acute encephalopathy 01/10/2016  . Delirium 01/07/2016  . Acute cystitis without hematuria 01/07/2016  . Hyperkalemia 01/07/2016  . Hyponatremia 01/07/2016  . Anemia 01/07/2016  . Morbid obesity ([place]) 01/07/2016  . Body aches 01/07/2016  . Chronic diastolic heart failure  ([place]) 03/19/2015  . COPD (chronic obstructive pulmonary disease) ([place]) 03/19/2015    Past Surgical History:  Procedure Laterality Date  . CHOLECYSTECTOMY    . PARTIAL HYSTERECTOMY      Current Outpatient Rx  . Order #: [id number] Class: Historical Med  . Order #: [id number] Class: Historical Med  . Order #: [id number] Class: Historical Med  . Order #: [id number] Class: Historical Med  . Order #: [id number] Class: Normal  . Order #: [id number] Class: Normal  . Order #: [id number] Class: Historical Med  . Order #: [id number] Class: Historical Med  . Order #: [id number] Class: Normal  . Order #: [id number] Class: Historical Med  . Order #: [id number] Class: Normal  . Order #: [id number] Class: Normal  . Order #: [id number] Class: Historical Med  . Order #: [id number] Class: No Print  . Order #: [id number] Class: No Print  . Order #: [id number] Class: Historical Med  . Order #: [id number] Class: Historical Med  . Order #: [id number] Class: No Print  . Order #: [id number] Class: Historical Med  . Order #: [id number] Class: Historical Med  . Order #: [id number] Class: Historical Med  . Order #: [id number] Class: Historical Med  . Order #: [id number] Class: Historical Med  . Order #: [id number] Class: Historical Med  . Order #: [id number] Class: Historical Med  . Order #: [id number] Class: Historical Med  . Order #: [id number] Class: Historical Med  . Order #: [id number] Class: Normal  . Order #: [id number] Class: Historical Med  . Order #: [id number] Class: Historical Med    Allergies Nsaids and Tramadol  Family History  Problem Relation Age of Onset  .  Colon cancer Brother   . Heart attack Father     Social History Social History  Substance Use Topics  . Smoking status: Former Smoker    Packs/day: 0.25    Quit date: 03/18/1994  . Smokeless tobacco: Never Used  . Alcohol use No    Review of Systems Unable to obtain due to patient status.   ____________________________________________   PHYSICAL EXAM:  VITAL  SIGNS: ED Triage Vitals  Enc Vitals Group     BP 06/30/17 1843 104/68     Pulse Rate 06/30/17 1839 64     Resp 06/30/17 1839 20     Temp 06/30/17 1839 97.7 F (36.5 C)     Temp Source 06/30/17 1839 Oral     SpO2 06/30/17 1839 96 %     Weight 06/30/17 1830 (!) 355 lb 8 oz (161.3 kg)     Height --      Head Circumference --      Peak Flow --      Pain Score --      Pain Loc --      Pain Edu? --      Excl. in [place]? --     Constitutional: The patient is somnolent but arousable to voice. She knows who she is but cannot tell the year, or where she is. Eyes: Conjunctivae are normal.  EOMI. No scleral icterus. No eye discharge. Head: Atraumatic. Nose: No congestion/rhinnorhea. Mouth/Throat: Mucous membranes are dry.  Neck: No stridor.  Supple.  No JVD. No meningismus. Cardiovascular: Normal rate, regular rhythm. No murmurs, rubs or gallops.  Respiratory: Normal respiratory effort.  No accessory muscle use or retractions. Lungs CTAB.  No wheezes, rales or ronchi. Gastrointestinal: Soft, morbidly obese and nondistended.  No obvious grimacing or evidence of tenderness to palpation on my examination. No guarding or rebound.  No peritoneal signs. Genitourinary: The patient has black liquid stool oozing from her rectum that is guaiac positive Musculoskeletal: Bilateral symmetric LE edema.  Neurologic:  somnolent but arouses to voice. Mildly slurred speech and answers most questions inappropriately  Face and smile are symmetric.  EOMI.  Moves all extremities well. Skin:  Skin is warm, dry and intact. No rash noted. Psychiatric: Able to assess due to patient mental status.  ____________________________________________   LABS (all labs ordered are listed, but only abnormal results are displayed)  Labs Reviewed  CBC - Abnormal; Notable for the following:       Result Value   RBC 2.93 (*)    Hemoglobin 9.5 (*)    HCT 30.5 (*)    MCV 104.3 (*)    MCHC 31.2 (*)    RDW 16.3 (*)    All other  components within normal limits  BLOOD GAS, VENOUS - Abnormal; Notable for the following:    pH, Ven 7.18 (*)    pCO2, Ven 94 (*)    Bicarbonate 35.1 (*)    Acid-Base Excess 4.8 (*)    All other components within normal limits  BASIC METABOLIC PANEL  TROPONIN I  BRAIN NATRIURETIC PEPTIDE  HEPATIC FUNCTION PANEL  PROTIME-INR  APTT  URINALYSIS, COMPLETE (UACMP) WITH MICROSCOPIC  TYPE AND SCREEN  PREPARE RBC (CROSSMATCH)   ____________________________________________  EKG  ED ECG REPORT I, Eula Listen, the attending physician, personally viewed and interpreted this ECG.   Date: 06/30/2017  EKG Time: 1921  Rate: 60  Rhythm: normal sinus rhythm  Axis: leftward  Intervals:first-degree A-V block   ST&T Change: No STEMI  ____________________________________________  RADIOLOGY  Dg Chest 1 View  Result Date: 06/30/2017 CLINICAL DATA:  Lethargy, COPD EXAM: CHEST 1 VIEW COMPARISON:  05/07/2017 FINDINGS: Cardiomegaly with atherosclerosis. Patchy atelectasis at the right base. No focal consolidation. No large effusion. No pneumothorax. Left basilar opacity unchanged. IMPRESSION: 1. No change in cardiomegaly. 2. Patchy left greater than right bibasilar opacity, may reflect atelectasis, no change from prior Electronically Signed   By: Donavan Foil M.D.   On: 06/30/2017 19:04    ____________________________________________   PROCEDURES  Procedure(s) performed: None  Procedures  Critical Care performed: Yes, see critical care note(s) ____________________________________________   INITIAL IMPRESSION / ASSESSMENT AND PLAN / ED COURSE  Pertinent labs & imaging results that were available during my care of the patient were reviewed by me and considered in my medical decision making (see chart for details).  82 y.o. female with COPD and multiple chronic comorbidities brought to the emergency department for altered mental status and relative hypotension. On arrival to  the ER, her vital signs are reassuring and she is afebrile. She does have active GI bleeding and given that it is melena, I'll initiate her on a Protonix drip and give her intravenous fluids. Her hemoglobin is just above 7, and given that she is actively bleeding we will initiate transfusion of 1 unit of blood. In addition, her blood gas shows acidemia with significant hypercarbia, which is likely the cause of her altered mental status. The respiratory therapist is on the way to place the patient on BiPAP. The patient will require admission to the hospital for further evaluation and treatment.  ----------------------------------------- 7:26 PM on 06/30/2017 -----------------------------------------  The patient has been placed on BiPAP at this time. One unit of blood has been ordered. She does have a patchy infiltrate on the right lung base concerning for possible pneumonia, and she has received vancomycin and Zosyn. The patient has an elevated troponin, but is not a candidate for ASA given her active GI bleed.  The patient has been admitted to the hospitalist for further evaluation and treatment.  CRITICAL CARE Performed by: Eula Listen   Total critical care time: 40 minutes  Critical care time was exclusive of separately billable procedures and treating other patients.  Critical care was necessary to treat or prevent imminent or life-threatening deterioration.  Critical care was time spent personally by me on the following activities: development of treatment plan with patient and/or surrogate as well as nursing, discussions with consultants, evaluation of patient's response to treatment, examination of patient, obtaining history from patient or surrogate, ordering and performing treatments and interventions, ordering and review of laboratory studies, ordering and review of radiographic studies, pulse oximetry and re-evaluation of patient's  condition.   ____________________________________________  FINAL CLINICAL IMPRESSION(S) / ED DIAGNOSES  Final diagnoses:  Acute respiratory failure with hypoxia and hypercarbia (HCC)  Gastrointestinal hemorrhage with melena  Altered mental status, unspecified altered mental status type  HCAP (healthcare-associated pneumonia)         NEW MEDICATIONS STARTED DURING THIS VISIT:  New Prescriptions   No medications on file      Eula Listen, MD 06/30/17 1927    Eula Listen, MD 06/30/17 [phone]

## 2017-06-30 NOTE — Consult Note (Signed)
Name: Kathy Guzman MRN: [id number] DOB: Aug 08, 1935    ADMISSION DATE:  06/30/2017  CONSULTATION DATE:  06/30/17  REFERRING MD :  Dr. Leslye Peer  CHIEF COMPLAINT: Altered Mental Status  BRIEF PATIENT DESCRIPTION: 82 year old female with acute on chronic respiratory failure secondary to COPD exacerbation  SIGNIFICANT EVENTS  8/29 Patient admitted to the SDU with Acute On chronic respiratory failure secondary to COPD Exacerbation  STUDIES:  02/08/2016 ECHO>>Not well visualized. The cavity size was normal.  Systolic function was normal. The estimated ejection fraction was  in the range of 60% to 65%   HISTORY OF PRESENT ILLNESS:  Kathy Guzman is an 82 year old female with known history of Anxiety,COPD,Depression,GERD,Gout, Hyperlipidemia,Hypertension and hypothyroidism.  Patient was brought to [facility] ON 8/29 with altered mental status.  Patient was awake for breakfast and lunch but when family got there around 5 she was unable to be woken up.  She has been confused.  Patient was placed on Bipap in the ED. Patient started to be more awake. Patient was noted to have black tarry stool in the ED. Patient admitted to the SDU for closer monitoring.  PAST MEDICAL HISTORY :   has a past medical history of Anxiety; Atrial fibrillation ([place]); CHF (congestive heart failure) ([place]); COPD (chronic obstructive pulmonary disease) ([place]); Depression; GERD (gastroesophageal reflux disease); Gout; Hyperlipidemia; Hypertension; Hypothyroid; Osteoarthritis; Recurrent UTI; Skin cancer; and Urethral stricture.  has a past surgical history that includes Partial hysterectomy; Cholecystectomy; bladder tack; and arm surgery. Prior to Admission medications   Medication Sig Start Date End Date Taking? Authorizing Provider  acetaminophen (TYLENOL) 325 MG tablet Take 650 mg by mouth every 4 (four) hours as needed.   Yes [provider]  albuterol (PROVENTIL HFA;VENTOLIN HFA) 108 (90 BASE) MCG/ACT inhaler Inhale 2  puffs into the lungs every 4 (four) hours as needed for wheezing or shortness of breath.   Yes [provider]  allopurinol (ZYLOPRIM) 100 MG tablet Take 100 mg by mouth daily.    Yes [provider]  alum & mag hydroxide-simeth (MAALOX/MYLANTA) 200-200-20 MG/5ML suspension Take 30 mLs by mouth every 4 (four) hours as needed for indigestion or heartburn.   Yes [provider]  apixaban (ELIQUIS) 5 MG TABS tablet Take 1 tablet (5 mg total) by mouth 2 (two) times daily. 05/16/17  Yes Epifanio Lesches, MD  bisacodyl (DULCOLAX) 5 MG EC tablet Take 1 tablet (5 mg total) by mouth daily as needed for moderate constipation. 02/11/16  Yes Gouru, Illene Silver, MD  budesonide-formoterol (SYMBICORT) 160-4.5 MCG/ACT inhaler Inhale 2 puffs into the lungs 2 (two) times daily.   Yes [provider]  calcium carbonate (TUMS) 500 MG chewable tablet Chew 1 tablet by mouth every 4 (four) hours as needed for indigestion or heartburn.   Yes [provider]  carvedilol (COREG) 3.125 MG tablet Take 1 tablet (3.125 mg total) by mouth 2 (two) times daily. 05/16/17 05/16/18 Yes Epifanio Lesches, MD  cyanocobalamin 500 MCG tablet Take 500 mcg by mouth 2 (two) times daily.   Yes [provider]  diltiazem (CARDIZEM CD) 240 MG 24 hr capsule Take 1 capsule (240 mg total) by mouth daily. 05/16/17  Yes Epifanio Lesches, MD  docusate sodium (COLACE) 100 MG capsule Take 1 capsule (100 mg total) by mouth 2 (two) times daily. 02/11/16  Yes Gouru, Illene Silver, MD  ferrous sulfate 325 (65 FE) MG tablet Take 325 mg by mouth 2 (two) times daily with a meal.  Yes [provider]  fluticasone (FLONASE) 50 MCG/ACT nasal spray Place 2 sprays into both nostrils daily. 01/13/16  Yes Dustin Flock, MD  furosemide (LASIX) 20 MG tablet Take 2 tablets (40 mg total) by mouth daily. Patient taking differently: Take 40 mg by mouth 2 (two) times daily.  01/13/16  Yes Dustin Flock, MD  gabapentin  (NEURONTIN) 100 MG capsule Take 200 mg by mouth 2 (two) times daily.    Yes [provider]  hydrOXYzine (ATARAX/VISTARIL) 25 MG tablet Take 25 mg by mouth every 6 (six) hours as needed.   Yes [provider]  ipratropium-albuterol (DUONEB) 0.5-2.5 (3) MG/3ML SOLN Take 3 mLs by nebulization every 6 (six) hours. 01/13/16  Yes Dustin Flock, MD  levothyroxine (SYNTHROID, LEVOTHROID) 75 MCG tablet Take 75 mcg by mouth daily before breakfast.   Yes [provider]  losartan (COZAAR) 50 MG tablet Take 50 mg by mouth daily.   Yes [provider]  nystatin cream (MYCOSTATIN) Apply 1 application topically every 8 (eight) hours as needed (for yeast). Pt applies to all skin folds.   Yes [provider]  omeprazole (PRILOSEC) 20 MG capsule Take 20 mg by mouth daily.    Yes [provider]  PARoxetine (PAXIL) 30 MG tablet Take 30 mg by mouth daily.   Yes [provider]  QUEtiapine (SEROQUEL) 25 MG tablet Take 25 mg by mouth at bedtime.    Yes [provider]  saccharomyces boulardii (FLORASTOR) 250 MG capsule Take 250 mg by mouth 2 (two) times daily.   Yes [provider]  Skin Protectants, Misc. (CALAZIME SKIN PROTECTANT EX) Apply 1 application topically 4 (four) times daily as needed (for rash). Pt applies to buttocks.   Yes [provider]  Vitamin D, Ergocalciferol, (DRISDOL) 50000 units CAPS capsule Take 50,000 Units by mouth every 30 (thirty) days.   Yes [provider]  Amino Acids-Protein Hydrolys (FEEDING SUPPLEMENT, PRO-STAT SUGAR FREE 64,) LIQD Take 30 mLs by mouth 2 (two) times daily. 02/11/16   Gouru, Illene Silver, MD  Cranberry-Vitamin C-Inulin (UTI-STAT) LIQD Take 30 mLs by mouth daily.    [provider]  valsartan (DIOVAN) 80 MG tablet Take 80 mg by mouth daily.     [provider]   Allergies  Allergen Reactions  . Nsaids Other (See Comments)    Reaction:  Unknown   . Tramadol  Itching    FAMILY HISTORY:  family history includes CVA in her mother; Colon cancer in her brother; Heart attack in her father. SOCIAL HISTORY:  reports that she quit smoking about 23 years ago. She smoked 0.25 packs per day. She has never used smokeless tobacco. She reports that she does not drink alcohol or use drugs.  REVIEW OF SYSTEMS:   Constitutional: Negative for fever, chills, weight loss, malaise/fatigue and diaphoresis.  HENT: Negative for hearing loss, ear pain, nosebleeds, congestion, sore throat, neck pain, tinnitus and ear discharge.   Eyes: Negative for blurred vision, double vision, photophobia, pain, discharge and redness.  Respiratory: Negative for cough, hemoptysis, sputum production, shortness of breath, wheezing and stridor.   Cardiovascular: Negative for chest pain, palpitations, orthopnea, claudication, leg swelling and PND.  Gastrointestinal: Negative for heartburn, nausea, vomiting, abdominal pain, diarrhea, constipation, blood in stool and melena.  Genitourinary: Negative for dysuria, urgency, frequency, hematuria and flank pain.  Musculoskeletal: Negative for myalgias, back pain, joint pain and falls.  Skin: Negative for itching and rash.  Neurological: Negative for dizziness, tingling, tremors, sensory change,  speech change, focal weakness, seizures, loss of consciousness, weakness and headaches.  Endo/Heme/Allergies: Negative for environmental allergies and polydipsia. Does not bruise/bleed easily.  SUBJECTIVE: Patient is confused  VITAL SIGNS: Temp:  [97.6 F (36.4 C)-97.7 F (36.5 C)] 97.6 F (36.4 C) (08/29 2116) Pulse Rate:  [36-64] 58 (08/29 2116) Resp:  [13-20] 15 (08/29 2116) BP: (104-132)/(68-101) 132/101 (08/29 2116) SpO2:  [64 %-100 %] 97 % (08/29 2116) Weight:  [161.3 kg (355 lb 8 oz)] 161.3 kg (355 lb 8 oz) (08/29 1830)  PHYSICAL EXAMINATION: General:  Morbidly obese, caucasian female, in no distress Neuro:  Awake but confused HEENT:  AT,[place],no jvd Cardiovascular: s1s2,,bradycardia,regular, no wheezes,crackles,rhonchi Lungs: Diminished air entry throughout, no wheezes,crackles,rhonchi Abdomen:  Obese,NT,ND Musculoskeletal:  No edema, cyanosis Skin:  Warm,dry and intact   Recent Labs Lab 06/30/17 1832  NA 138  K 4.8  CL 100*  CO2 29  BUN 48*  CREATININE 2.09*  GLUCOSE 142*    Recent Labs Lab 06/30/17 1832  HGB 9.5*  HCT 30.5*  WBC 10.9  PLT 269   Dg Chest 1 View  Result Date: 06/30/2017 CLINICAL DATA:  Lethargy, COPD EXAM: CHEST 1 VIEW COMPARISON:  05/07/2017 FINDINGS: Cardiomegaly with atherosclerosis. Patchy atelectasis at the right base. No focal consolidation. No large effusion. No pneumothorax. Left basilar opacity unchanged. IMPRESSION: 1. No change in cardiomegaly. 2. Patchy left greater than right bibasilar opacity, may reflect atelectasis, no change from prior Electronically Signed   By: Donavan Foil M.D.   On: 06/30/2017 19:04    ASSESSMENT / PLAN:  Acute hypercarbic/hypoxic respiratory failure secondary to COPD exacerbation Obesity Hypoventilation Syndrome Upper GI bleed Atrial Fibrillation Hx of CHF Chronic kidney disease stage IV Hx of GERD    PLAN Patient is off bipap, and is on Nasal cannula currently Keep sats >88% Continue steroids, taper Protonix Gtt GI consulted One unit of Blood ordered by EDP Continue to check serial Hgb Will d/c vanc and zosyn now as PCT<0.10 Continue Azithromycin X4 days Continue lasix Continue coreg/cardizem Will restart rest of the home medications when patient more stable Hold Eliquis due to concerns of GIB Continue Synthroid Monitor I/o   Kathy Guzman,AG-ACNP Pulmonary and [place]   06/30/2017, 9:33 PM

## 2017-06-30 NOTE — ED Triage Notes (Signed)
Pt presents to ED from [facility]. EMS reports that family wanted pt sent out d/t lethargy and low BP. EMS states 104 palp for BP. Pt arrives confused, thinks she is at the house. Pt has COPD, wears oxygen chronically but EMS did not receive report from [facility] about how much oxygen she wears. EMS reports pt was 86% on oxygen, raised to 6 L and oxygen levels came up per EMS. PT arrives with DNR paperwork.

## 2017-06-30 NOTE — ED Notes (Signed)
Date and time results received: 06/30/17 1928   Test: troponin Critical Value: 0.04  Name of Provider Notified: Dr. Mariea Clonts

## 2017-06-30 NOTE — ED Notes (Signed)
Pt called out stating that she was "messing in my britches." warm wipes grabbed. Pulled back pt brief to find black tarry stool, large amount. Cleaned pt with the help of a few nurses. Pt turned some by self. Clean chucks placed under pt. Pt moved up in bed.

## 2017-06-30 NOTE — ED Notes (Signed)
Date and time results received: 06/30/17 1850   Test: VBG Critical Value: ph 7.18, pCO2 94  Name of Provider Notified: Dr. Mariea Clonts

## 2017-06-30 NOTE — Progress Notes (Signed)
Pharmacy Antibiotic Note  Kathy Guzman is a 82 y.o. female admitted on 06/30/2017 with pneumonia.  Pharmacy has been consulted for Vancomycin and Zosyn dosing.   Patient from [facility] with hx COPD, CHF, HTN, HL, recurrent UTI   Plan: Patient received Vancomycin 2000 mg x 1 in ER and Zosyn 3.375gm x 1 in ER. Vancomycin 1250 IV every 36 hours.  Goal trough 15-20 mcg/mL. Zosyn 3.375g IV q8h (4 hour infusion).  F/u MRSA PCR.  Morbid obesity Ke 0.019  t1/2  36.48  Vd 66   Adj BW= 94.5 kg Crcl 32 ml/min.  F/u renal fxn and will need to order Vancomycin trough when appropriate.   Weight: (!) 355 lb 8 oz (161.3 kg)  Temp (24hrs), Avg:97.7 F (36.5 C), Min:97.6 F (36.4 C), Max:97.7 F (36.5 C)   Recent Labs Lab 06/30/17 1832  WBC 10.9  CREATININE 2.09*    Estimated Creatinine Clearance: 32.1 mL/min (A) (by C-G formula based on SCr of 2.09 mg/dL (H)).    Allergies  Allergen Reactions  . Nsaids Other (See Comments)    Reaction:  Unknown   . Tramadol Itching    Antimicrobials this admission: Zosyn 8/29 >>   Vanc 8/29 >>   Azith 8/29 >>  Dose adjustments this admission:    Microbiology results:   BCx:     UCx:  UA neg     Sputum:    8/29 MRSA PCR: pending CXR: Patchy left greater than right bibasilar opacity  Thank you for allowing pharmacy to be a part of this patient's care.  Fermon Ureta A 06/30/2017 9:58 PM

## 2017-06-30 NOTE — ED Notes (Signed)
While lying pt down to clean the stool off of her, pt oxygen dropped into 60's. Increased oxygen that pt was receiving.

## 2017-06-30 NOTE — Progress Notes (Signed)
Patient has been placed on bipap. Tolerated well

## 2017-07-01 DIAGNOSIS — K921 Melena: Secondary | ICD-10-CM

## 2017-07-01 DIAGNOSIS — J9602 Acute respiratory failure with hypercapnia: Secondary | ICD-10-CM

## 2017-07-01 DIAGNOSIS — J9601 Acute respiratory failure with hypoxia: Secondary | ICD-10-CM

## 2017-07-01 LAB — PROCALCITONIN

## 2017-07-01 LAB — CBC
HCT: 34.7 % — ABNORMAL LOW (ref 35.0–47.0)
Hemoglobin: 11 g/dL — ABNORMAL LOW (ref 12.0–16.0)
MCH: 32.1 pg (ref 26.0–34.0)
MCHC: 31.7 g/dL — AB (ref 32.0–36.0)
MCV: 101.2 fL — ABNORMAL HIGH (ref 80.0–100.0)
PLATELETS: 252 10*3/uL (ref 150–440)
RBC: 3.43 MIL/uL — ABNORMAL LOW (ref 3.80–5.20)
RDW: 20.5 % — AB (ref 11.5–14.5)
WBC: 10.1 10*3/uL (ref 3.6–11.0)

## 2017-07-01 LAB — BASIC METABOLIC PANEL
ANION GAP: 9 (ref 5–15)
BUN: 46 mg/dL — ABNORMAL HIGH (ref 6–20)
CALCIUM: 8.8 mg/dL — AB (ref 8.9–10.3)
CO2: 30 mmol/L (ref 22–32)
Chloride: 101 mmol/L (ref 101–111)
Creatinine, Ser: 1.94 mg/dL — ABNORMAL HIGH (ref 0.44–1.00)
GFR, EST AFRICAN AMERICAN: 27 mL/min — AB (ref >60)
GFR, EST NON AFRICAN AMERICAN: 23 mL/min — AB (ref >60)
GLUCOSE: 140 mg/dL — AB (ref 65–99)
Potassium: 5 mmol/L (ref 3.5–5.1)
Sodium: 140 mmol/L (ref 135–145)

## 2017-07-01 LAB — C DIFFICILE QUICK SCREEN W PCR REFLEX
C DIFFICILE (CDIFF) TOXIN: NEGATIVE
C DIFFICLE (CDIFF) ANTIGEN: POSITIVE — AB

## 2017-07-01 LAB — CLOSTRIDIUM DIFFICILE BY PCR: Toxigenic C. Difficile by PCR: POSITIVE — AB

## 2017-07-01 MED ORDER — PANTOPRAZOLE SODIUM 40 MG PO TBEC
40.0000 mg | DELAYED_RELEASE_TABLET | Freq: Two times a day (BID) | ORAL | Status: DC
  Administered 2017-07-01 – 2017-07-05 (×8): 40 mg via ORAL
  Filled 2017-07-01 (×9): qty 1

## 2017-07-01 MED ORDER — GABAPENTIN 100 MG PO CAPS
200.0000 mg | ORAL_CAPSULE | Freq: Two times a day (BID) | ORAL | Status: DC
Start: 2017-07-01 — End: 2017-07-05
  Administered 2017-07-01 – 2017-07-05 (×8): 200 mg via ORAL
  Filled 2017-07-01 (×9): qty 2

## 2017-07-01 MED ORDER — METHYLPREDNISOLONE SODIUM SUCC 40 MG IJ SOLR: 20.0000 mg | Freq: Every day | INTRAMUSCULAR | Status: DC

## 2017-07-01 MED ORDER — IPRATROPIUM-ALBUTEROL 0.5-2.5 (3) MG/3ML IN SOLN
3.0000 mL | Freq: Four times a day (QID) | RESPIRATORY_TRACT | Status: DC
  Administered 2017-07-01 – 2017-07-05 (×14): 3 mL via RESPIRATORY_TRACT
  Filled 2017-07-01 (×13): qty 3

## 2017-07-01 NOTE — Progress Notes (Signed)
[facility] [place] NAME: Kathy Guzman    MR#:  [id number]  DATE OF BIRTH:  [birth year]  SUBJECTIVE:   Patient here due to altered mental status and hypercapnic respiratory failure.Weaned off the BiPAP now and hypercapnia has improved. Patient remained somewhat anxious and confused at times as per the family. No other acute events overnight. Hemoglobin stable posttransfusion and seen by gastroenterology and no plans for acute intervention.  REVIEW OF SYSTEMS:    Review of Systems  Constitutional: Negative for chills and fever.  HENT: Negative for congestion and tinnitus.   Eyes: Negative for blurred vision and double vision.  Respiratory: Negative for cough, shortness of breath and wheezing.   Cardiovascular: Negative for chest pain, orthopnea and PND.  Gastrointestinal: Negative for abdominal pain, diarrhea, nausea and vomiting.  Genitourinary: Negative for dysuria and hematuria.  Neurological: Positive for weakness. Negative for dizziness, sensory change and focal weakness.  All other systems reviewed and are negative.   Nutrition: Clear liquid and will advance as tolerated.  Tolerating Diet: Yes Tolerating PT: Bedbound at baseline.   DRUG ALLERGIES:   Allergies  Allergen Reactions  . Nsaids Other (See Comments)    Reaction:  Unknown   . Tramadol Itching    VITALS:  Blood pressure 95/78, pulse 79, temperature 98.3 F (36.8 C), temperature source Oral, resp. rate 16, height 5\' 3"  (1.6 m), weight (!) 151.3 kg (333 lb 8.9 oz), SpO2 100 %.  PHYSICAL EXAMINATION:   Physical Exam  GENERAL:  82 y.o.-year-old morbidly obese patient lying in bed anxious but in NAD. Marland Kitchen  EYES: Pupils equal, round, reactive to light and accommodation. No scleral icterus. Extraocular muscles intact.  HEENT: Head atraumatic, normocephalic. Oropharynx and nasopharynx clear.  NECK:  Supple, no jugular venous distention. No thyroid enlargement, no tenderness.   LUNGS: Poor Resp. effort, no wheezing, rales, rhonchi. No use of accessory muscles of respiration.  CARDIOVASCULAR: S1, S2 normal. No murmurs, rubs, or gallops.  ABDOMEN: Soft, nontender, nondistended. Bowel sounds present. No organomegaly or mass.  EXTREMITIES: No cyanosis, clubbing, +2 edema b/l due to chronic venous stasis.   NEUROLOGIC: Cranial nerves II through XII are intact. No focal Motor or sensory deficits b/l.  Globally weak and bedbound PSYCHIATRIC: The patient is alert and oriented x 2. SKIN: No obvious rash, lesion, or ulcer.    LABORATORY PANEL:   CBC  Recent Labs Lab 07/01/17 0439  WBC 10.1  HGB 11.0*  HCT 34.7*  PLT 252   ------------------------------------------------------------------------------------------------------------------  Chemistries   Recent Labs Lab 06/30/17 1832 07/01/17 0439  NA 138 140  K 4.8 5.0  CL 100* 101  CO2 29 30  GLUCOSE 142* 140*  BUN 48* 46*  CREATININE 2.09* 1.94*  CALCIUM 9.0 8.8*  AST 12*  --   ALT 11*  --   ALKPHOS 118  --   BILITOT 0.5  --    ------------------------------------------------------------------------------------------------------------------  Cardiac Enzymes  Recent Labs Lab 06/30/17 1832  TROPONINI 0.04*   ------------------------------------------------------------------------------------------------------------------  RADIOLOGY:  Dg Chest 1 View  Result Date: 06/30/2017 CLINICAL DATA:  Lethargy, COPD EXAM: CHEST 1 VIEW COMPARISON:  05/07/2017 FINDINGS: Cardiomegaly with atherosclerosis. Patchy atelectasis at the right base. No focal consolidation. No large effusion. No pneumothorax. Left basilar opacity unchanged. IMPRESSION: 1. No change in cardiomegaly. 2. Patchy left greater than right bibasilar opacity, may reflect atelectasis, no change from prior Electronically Signed   By: Donavan Foil M.D.   On: 06/30/2017 19:04  ASSESSMENT AND PLAN:   82 year old female with past medical  history of morbid obesity,chronic atrial fibrillation, COPD, GERD, history of congestive heart failure, hypertension hyperlipidemia hypothyroidism, recurrent urinary tract infections a presented to the hospital due to altered mental status.  1. Altered mental status-this was metabolic encephalopathy secondary to the patient's hypercapnic respiratory failure. -Patient's hypercapnia has now resolved mental status has improved but she continues to be somewhat anxious. -Follow mental status.  2. Acute respiratory failure with hypercapnia-secondary to obesity pickwickian syndrome combined with underlying COPD. -Patient was on BiPAP and now has been weaned off of it. Continue IV steroids which have been tapered. -continue DuoNeb's.  3. Hypothyroidism-continue Synthroid.  4. Neuropathy-continue gabapentin.  5. History of gout-no acute attack. Continue allopurinol.  6. History of chronic atrial fibrillation-continue carvedilol, Cardizem. Rate controlled. -Anticoagulation on hold given suspected GI bleed.  7. GI bleed-this was suspected to be an upper GI bleed. Hemoglobin has improved posttransfusion. Seen by gastroenterology and no plans for acute intervention given her comorbidities and her respiratory status. -Continue to hold Eliquis for now and if hemoglobin stable tomorrow can likely resume. - cont. Protonix.   8. Hx of Anxiety - cont. Paxil, Seroquel.     All the records are reviewed and case discussed with Care Management/Social Worker. Management plans discussed with the patient, family and they are in agreement.  CODE STATUS: DNR  DVT Prophylaxis: Ted's & SCD's   TOTAL TIME TAKING CARE OF THIS PATIENT: 30 minutes.   POSSIBLE D/C IN 2-3 DAYS, DEPENDING ON CLINICAL CONDITION.   Henreitta Leber M.D on 07/01/2017 at 2:41 PM  Between 7am to 6pm - Pager - [phone]  After 6pm go to [email or web address] - Proofreader  [facility]  Office   [phone]  CC: Primary care physician; Leonel Ramsay, MD

## 2017-07-01 NOTE — Progress Notes (Signed)
Pt transported in stable condition. Family at bedside. All belongings with patient.

## 2017-07-01 NOTE — Consult Note (Signed)
Lucilla Lame, MD [facility]  [address] [place], [place] [address] Phone: [phone] Fax : [phone]  Consultation  Referring Provider:     Dr. Leslye Peer Primary Care Physician:  Leonel Ramsay, MD Primary Gastroenterologist:  Althia Forts         Reason for Consultation:     [place]  Date of Admission:  06/30/2017 Date of Consultation:  07/01/2017         HPI:   Kathy Guzman is a 82 y.o. female who was admitted with respiratory distress. The patient is a resident of a nursing home. The patient is not able to give much history and the history is gotten from the family. The patient has been on iron for some time and has had chronically black stools. The patient was in the ER for shortness of breath and noted to have a dark stool. The patient's baseline hemoglobin in has been around 10. It has been as low as 9.5 in the past. The patient's admission hemoglobin was 9.5. The patient was transfused with 1 unit of blood and this morning her hemoglobin was 11. The patient denies any abdominal pain nausea vomiting fevers or chills. The patient is now being consult at for dark stools while taking iron.  Past Medical History:  Diagnosis Date  . Anxiety   . Atrial fibrillation ([place])   . CHF (congestive heart failure) ([place])   . COPD (chronic obstructive pulmonary disease) ([place])   . Depression   . GERD (gastroesophageal reflux disease)   . Gout   . Hyperlipidemia   . Hypertension   . Hypothyroid   . Osteoarthritis   . Recurrent UTI   . Skin cancer   . Urethral stricture     Past Surgical History:  Procedure Laterality Date  . arm surgery    . bladder tack    . CHOLECYSTECTOMY    . PARTIAL HYSTERECTOMY      Prior to Admission medications   Medication Sig Start Date End Date Taking? Authorizing Provider  acetaminophen (TYLENOL) 325 MG tablet Take 650 mg by mouth every 4 (four) hours as needed.   Yes [provider]  albuterol (PROVENTIL HFA;VENTOLIN HFA) 108  (90 BASE) MCG/ACT inhaler Inhale 2 puffs into the lungs every 4 (four) hours as needed for wheezing or shortness of breath.   Yes [provider]  allopurinol (ZYLOPRIM) 100 MG tablet Take 100 mg by mouth daily.    Yes [provider]  alum & mag hydroxide-simeth (MAALOX/MYLANTA) 200-200-20 MG/5ML suspension Take 30 mLs by mouth every 4 (four) hours as needed for indigestion or heartburn.   Yes [provider]  apixaban (ELIQUIS) 5 MG TABS tablet Take 1 tablet (5 mg total) by mouth 2 (two) times daily. 05/16/17  Yes Epifanio Lesches, MD  bisacodyl (DULCOLAX) 5 MG EC tablet Take 1 tablet (5 mg total) by mouth daily as needed for moderate constipation. 02/11/16  Yes Gouru, Illene Silver, MD  budesonide-formoterol (SYMBICORT) 160-4.5 MCG/ACT inhaler Inhale 2 puffs into the lungs 2 (two) times daily.   Yes [provider]  calcium carbonate (TUMS) 500 MG chewable tablet Chew 1 tablet by mouth every 4 (four) hours as needed for indigestion or heartburn.   Yes [provider]  carvedilol (COREG) 3.125 MG tablet Take 1 tablet (3.125 mg total) by mouth 2 (two) times daily. 05/16/17 05/16/18 Yes Epifanio Lesches, MD  cyanocobalamin 500 MCG tablet Take 500 mcg by mouth 2 (two) times daily.   Yes  [provider]  diltiazem (CARDIZEM CD) 240 MG 24 hr capsule Take 1 capsule (240 mg total) by mouth daily. 05/16/17  Yes Epifanio Lesches, MD  docusate sodium (COLACE) 100 MG capsule Take 1 capsule (100 mg total) by mouth 2 (two) times daily. 02/11/16  Yes Gouru, Illene Silver, MD  ferrous sulfate 325 (65 FE) MG tablet Take 325 mg by mouth 2 (two) times daily with a meal.    Yes [provider]  fluticasone (FLONASE) 50 MCG/ACT nasal spray Place 2 sprays into both nostrils daily. 01/13/16  Yes Dustin Flock, MD  furosemide (LASIX) 20 MG tablet Take 2 tablets (40 mg total) by mouth daily. Patient taking differently: Take 40 mg by mouth 2 (two) times daily.  01/13/16   Yes Dustin Flock, MD  gabapentin (NEURONTIN) 100 MG capsule Take 200 mg by mouth 2 (two) times daily.    Yes [provider]  hydrOXYzine (ATARAX/VISTARIL) 25 MG tablet Take 25 mg by mouth every 6 (six) hours as needed.   Yes [provider]  ipratropium-albuterol (DUONEB) 0.5-2.5 (3) MG/3ML SOLN Take 3 mLs by nebulization every 6 (six) hours. 01/13/16  Yes Dustin Flock, MD  levothyroxine (SYNTHROID, LEVOTHROID) 75 MCG tablet Take 75 mcg by mouth daily before breakfast.   Yes [provider]  losartan (COZAAR) 50 MG tablet Take 50 mg by mouth daily.   Yes [provider]  nystatin cream (MYCOSTATIN) Apply 1 application topically every 8 (eight) hours as needed (for yeast). Pt applies to all skin folds.   Yes [provider]  omeprazole (PRILOSEC) 20 MG capsule Take 20 mg by mouth daily.    Yes [provider]  PARoxetine (PAXIL) 30 MG tablet Take 30 mg by mouth daily.   Yes [provider]  QUEtiapine (SEROQUEL) 25 MG tablet Take 25 mg by mouth at bedtime.    Yes [provider]  saccharomyces boulardii (FLORASTOR) 250 MG capsule Take 250 mg by mouth 2 (two) times daily.   Yes [provider]  Skin Protectants, Misc. (CALAZIME SKIN PROTECTANT EX) Apply 1 application topically 4 (four) times daily as needed (for rash). Pt applies to buttocks.   Yes [provider]  Vitamin D, Ergocalciferol, (DRISDOL) 50000 units CAPS capsule Take 50,000 Units by mouth every 30 (thirty) days.   Yes [provider]  Amino Acids-Protein Hydrolys (FEEDING SUPPLEMENT, PRO-STAT SUGAR FREE 64,) LIQD Take 30 mLs by mouth 2 (two) times daily. 02/11/16   Gouru, Illene Silver, MD  Cranberry-Vitamin C-Inulin (UTI-STAT) LIQD Take 30 mLs by mouth daily.    [provider]  valsartan (DIOVAN) 80 MG tablet Take 80 mg by mouth daily.     [provider]    Family History  Problem Relation Age of Onset  . Colon cancer  Brother   . Heart attack Father   . CVA Mother      Social History  Substance Use Topics  . Smoking status: Former Smoker    Packs/day: 0.25    Quit date: 03/18/1994  . Smokeless tobacco: Never Used  . Alcohol use No    Allergies as of 06/30/2017 - Review Complete 06/30/2017  Allergen Reaction Noted  . Nsaids Other (See Comments) 02/07/2016  . Tramadol Itching 03/19/2015    Review of Systems:    All systems reviewed and negative except where noted in HPI.   Physical Exam:  Vital signs in last 24 hours: Temp:  [97.5 F (36.4 C)-98.6 F (37 C)] 98.6 F (37 C) (  08/30 0826) Pulse Rate:  [36-89] 86 (08/30 1152) Resp:  [13-27] 19 (08/30 1000) BP: (72-174)/(35-156) 158/141 (08/30 1152) SpO2:  [64 %-100 %] 92 % (08/30 1100) Weight:  [333 lb 8.9 oz (151.3 kg)-355 lb 8 oz (161.3 kg)] 333 lb 8.9 oz (151.3 kg) (08/29 2116) Last BM Date:  (06/30/2017) General:   Pleasant, cooperative in NAD, morbidly obese Head:  Normocephalic and atraumatic. Eyes:   No icterus.   Conjunctiva pink. PERRLA. Ears:  Normal auditory acuity. Neck:  Supple; no masses or thyroidomegaly Lungs: Respirations even and unlabored. Lungs clear to auscultation bilaterally.   No wheezes, crackles, or rhonchi.  Heart:  Regular rate and rhythm;  Without murmur, clicks, rubs or gallops Abdomen:  Soft, nondistended, nontender. Normal bowel sounds. No appreciable masses or hepatomegaly.  No rebound or guarding.  Rectal:  Not performed. Msk:  Symmetrical without gross deformities.    Extremities:  Without edema, cyanosis or clubbing. Neurologic:  Alert and confused;  grossly normal neurologically. Skin:  Intact without significant lesions or rashes. Cervical Nodes:  No significant cervical adenopathy. Psych:  Alert and cooperative. Normal affect.  LAB RESULTS:  Recent Labs  06/30/17 1832 07/01/17 0439  WBC 10.9 10.1  HGB 9.5* 11.0*  HCT 30.5* 34.7*  PLT 269 252   BMET  Recent Labs  06/30/17 1832  07/01/17 0439  NA 138 140  K 4.8 5.0  CL 100* 101  CO2 29 30  GLUCOSE 142* 140*  BUN 48* 46*  CREATININE 2.09* 1.94*  CALCIUM 9.0 8.8*   LFT  Recent Labs  06/30/17 1832  PROT 6.3*  ALBUMIN 2.8*  AST 12*  ALT 11*  ALKPHOS 118  BILITOT 0.5  BILIDIR <0.1*  IBILI NOT CALCULATED   PT/INR  Recent Labs  06/30/17 1832  LABPROT 20.2*  INR 1.74    STUDIES: Dg Chest 1 View  Result Date: 06/30/2017 CLINICAL DATA:  Lethargy, COPD EXAM: CHEST 1 VIEW COMPARISON:  05/07/2017 FINDINGS: Cardiomegaly with atherosclerosis. Patchy atelectasis at the right base. No focal consolidation. No large effusion. No pneumothorax. Left basilar opacity unchanged. IMPRESSION: 1. No change in cardiomegaly. 2. Patchy left greater than right bibasilar opacity, may reflect atelectasis, no change from prior Electronically Signed   By: Donavan Foil M.D.   On: 06/30/2017 19:04      Impression / Plan:   SHALLON YAKLIN is a 82 y.o. y/o female with With black stools while taking iron. The patient was transfused at her baseline of 9.5 and is now having a hemoglobin of 11. The patient has had no further black stools overnight. The patient was admitted for respiratory distress. I would continue treating the patient for respiratory distress and monitor hemoglobin but there is no need for any endoscopic procedures at this time. The patient's family states that they were told she would not be sedated due to the high risk for a shoulder injury in the past. Nothing further to do from a GI point of view. I will sign off.  Please call if any further GI concerns or questions.  We would like to thank you for the opportunity to participate in the care of Kathy Guzman.   Thank you for involving me in the care of this patient.      LOS: 1 day   Lucilla Lame, MD  07/01/2017, 1:07 PM   Note: This dictation was prepared with Dragon dictation along with smaller phrase technology. Any transcriptional errors that result  from this process are unintentional.

## 2017-07-01 NOTE — Progress Notes (Signed)
   07/01/17 0855  Clinical Encounter Type  Visited With Other (Comment)  Visit Type Initial;Spiritual support  Referral From Nurse;Other (Comment)   Silent prayer provided for patient during rounding on unit. [place] will have on-call follow-up.

## 2017-07-01 NOTE — Progress Notes (Signed)
Pt placed on [facility] CPAP C-1. CPAP plugged into red outlet. 3L O2 inline. Pt family at bedside.

## 2017-07-01 NOTE — Progress Notes (Signed)
BP 174/156 MAP 164  Pt very confused and unable to remain still at this time. Difficulty in obtaining accurate BP. Will notify Dr. Mortimer Fries. At this time pt is refusing to allow RN to take another BP. Will continue to assess.

## 2017-07-02 LAB — BPAM RBC
Blood Product Expiration Date: 201809052359
Blood Product Expiration Date: 201809152359
ISSUE DATE / TIME: 201808300008
UNIT TYPE AND RH: 5100
Unit Type and Rh: 6200

## 2017-07-02 LAB — CBC
HEMATOCRIT: 31.6 % — AB (ref 35.0–47.0)
Hemoglobin: 10.1 g/dL — ABNORMAL LOW (ref 12.0–16.0)
MCH: 31.5 pg (ref 26.0–34.0)
MCHC: 32.1 g/dL (ref 32.0–36.0)
MCV: 98.3 fL (ref 80.0–100.0)
Platelets: 257 10*3/uL (ref 150–440)
RBC: 3.21 MIL/uL — ABNORMAL LOW (ref 3.80–5.20)
RDW: 19.5 % — AB (ref 11.5–14.5)
WBC: 11 10*3/uL (ref 3.6–11.0)

## 2017-07-02 LAB — BASIC METABOLIC PANEL
Anion gap: 10 (ref 5–15)
BUN: 53 mg/dL — ABNORMAL HIGH (ref 6–20)
CALCIUM: 8.8 mg/dL — AB (ref 8.9–10.3)
CO2: 30 mmol/L (ref 22–32)
CREATININE: 2.21 mg/dL — AB (ref 0.44–1.00)
Chloride: 102 mmol/L (ref 101–111)
GFR calc non Af Amer: 20 mL/min — ABNORMAL LOW (ref >60)
GFR, EST AFRICAN AMERICAN: 23 mL/min — AB (ref >60)
Glucose, Bld: 101 mg/dL — ABNORMAL HIGH (ref 65–99)
Potassium: 4.3 mmol/L (ref 3.5–5.1)
Sodium: 142 mmol/L (ref 135–145)

## 2017-07-02 LAB — TYPE AND SCREEN
ABO/RH(D): A POS
Antibody Screen: NEGATIVE
Unit division: 0
Unit division: 0

## 2017-07-02 LAB — BLOOD GAS, ARTERIAL
Acid-Base Excess: 8.2 mmol/L — ABNORMAL HIGH (ref 0.0–2.0)
Bicarbonate: 35.6 mmol/L — ABNORMAL HIGH (ref 20.0–28.0)
DELIVERY SYSTEMS: POSITIVE
FIO2: 0.32
INSPIRATORY PAP: 14
O2 SAT: 87.4 %
PATIENT TEMPERATURE: 37
PCO2 ART: 66 mmHg — AB (ref 32.0–48.0)
PO2 ART: 57 mmHg — AB (ref 83.0–108.0)
pH, Arterial: 7.34 — ABNORMAL LOW (ref 7.350–7.450)

## 2017-07-02 LAB — PROCALCITONIN

## 2017-07-02 LAB — GLUCOSE, CAPILLARY: GLUCOSE-CAPILLARY: 91 mg/dL (ref 65–99)

## 2017-07-02 MED ORDER — VANCOMYCIN 50 MG/ML ORAL SOLUTION
125.0000 mg | Freq: Four times a day (QID) | ORAL | Status: DC
  Administered 2017-07-02 – 2017-07-05 (×14): 125 mg via ORAL
  Filled 2017-07-02 (×17): qty 2.5

## 2017-07-02 MED ORDER — TRAZODONE HCL 50 MG PO TABS: 50.0000 mg | ORAL_TABLET | Freq: Every evening | ORAL | Status: DC | PRN

## 2017-07-02 MED ORDER — CHLORHEXIDINE GLUCONATE CLOTH 2 % EX PADS
6.0000 | MEDICATED_PAD | Freq: Every day | CUTANEOUS | Status: DC
  Administered 2017-07-02 – 2017-07-05 (×4): 6 via TOPICAL

## 2017-07-02 MED ORDER — METRONIDAZOLE IN NACL 5-0.79 MG/ML-% IV SOLN
500.0000 mg | Freq: Three times a day (TID) | INTRAVENOUS | Status: DC
  Administered 2017-07-02 – 2017-07-04 (×7): 500 mg via INTRAVENOUS
  Filled 2017-07-02 (×13): qty 100

## 2017-07-02 MED ORDER — APIXABAN 2.5 MG PO TABS
2.5000 mg | ORAL_TABLET | Freq: Two times a day (BID) | ORAL | Status: DC
  Administered 2017-07-02 – 2017-07-05 (×6): 2.5 mg via ORAL
  Filled 2017-07-02 (×6): qty 1

## 2017-07-02 MED ORDER — MUPIROCIN 2 % EX OINT
1.0000 "application " | TOPICAL_OINTMENT | Freq: Two times a day (BID) | CUTANEOUS | Status: DC
  Administered 2017-07-02 – 2017-07-05 (×6): 1 via NASAL
  Filled 2017-07-02 (×2): qty 22

## 2017-07-02 NOTE — NC FL2 (Signed)
[place] LEVEL OF CARE SCREENING TOOL     IDENTIFICATION  Patient Name: Kathy Guzman Birthdate: [birth year] Sex: female Admission Date (Current Location): 06/30/2017  [place] and [place] Number:  Engineering geologist and Address:  [facility], [address], [place], [place] [address]      Provider Number: [id number]  Attending Physician Name and Address:  Loletha Grayer, MD  Relative Name and Phone Number:       Current Level of Care: Hospital Recommended Level of Care: [place] Prior Approval Number:    Date Approved/Denied:   PASRR Number:    Discharge Plan: SNF    Current Diagnoses: Patient Active Problem List   Diagnosis Date Noted  . Acute respiratory failure with hypercapnia ([place]) 06/30/2017  . Gastrointestinal hemorrhage with melena   . Altered mental status   . Acute respiratory failure with hypoxia and hypercarbia ([place]) 05/06/2017  . Urinary tract infection without hematuria   . SOB (shortness of breath)   . Chronic respiratory failure ([place]) 02/24/2016  . Pressure ulcer 02/08/2016  . Sepsis ([place]) 02/07/2016  . Acute encephalopathy 01/10/2016  . Delirium 01/07/2016  . Acute cystitis without hematuria 01/07/2016  . Hyperkalemia 01/07/2016  . Hyponatremia 01/07/2016  . Anemia 01/07/2016  . Morbid obesity ([place]) 01/07/2016  . Body aches 01/07/2016  . Chronic diastolic heart failure ([place]) 03/19/2015  . COPD (chronic obstructive pulmonary disease) ([place]) 03/19/2015    Orientation RESPIRATION BLADDER Height & Weight     Self   (bipap) Incontinent Weight: (!) 333 lb 8.9 oz (151.3 kg) Height:  5\' 2"  (157.5 cm)  BEHAVIORAL SYMPTOMS/MOOD NEUROLOGICAL BOWEL NUTRITION STATUS   (none)  (none) Incontinent Diet (cardiac)  AMBULATORY STATUS COMMUNICATION OF NEEDS Skin   Total Care Verbally Normal                       Personal Care Assistance Level of Assistance  Total care       Total Care  Assistance: Maximum assistance   Functional Limitations Info             SPECIAL CARE FACTORS FREQUENCY                       Contractures Contractures Info: Not present    Additional Factors Info  Allergies, Code Status Code Status Info: dnr Allergies Info: nsaids; tramadol           Current Medications (07/02/2017):  This is the current hospital active medication list Current Facility-Administered Medications  Medication Dose Route Frequency Provider Last Rate Last Dose  . acetaminophen (TYLENOL) tablet 650 mg  650 mg Oral Q6H PRN Loletha Grayer, MD       Or  . acetaminophen (TYLENOL) suppository 650 mg  650 mg Rectal Q6H PRN Wieting, Richard, MD      . allopurinol (ZYLOPRIM) tablet 100 mg  100 mg Oral Daily Loletha Grayer, MD   100 mg at 07/01/17 1152  . carvedilol (COREG) tablet 3.125 mg  3.125 mg Oral BID Loletha Grayer, MD   3.125 mg at 07/01/17 2316  . Chlorhexidine Gluconate Cloth 2 % PADS 6 each  6 each Topical Q0600 Anette Riedel, RPH   6 each at 07/02/17 1010  . diltiazem (CARDIZEM CD) 24 hr capsule 240 mg  240 mg Oral Daily Loletha Grayer, MD   240 mg at 07/01/17 1214  . feeding supplement (PRO-STAT SUGAR FREE 64) liquid 30  mL  30 mL Oral BID Loletha Grayer, MD   30 mL at 07/01/17 1153  . fluticasone (FLONASE) 50 MCG/ACT nasal spray 2 spray  2 spray Each Nare Daily Loletha Grayer, MD   2 spray at 07/01/17 1507  . gabapentin (NEURONTIN) capsule 200 mg  200 mg Oral BID Flora Lipps, MD   200 mg at 07/01/17 2259  . hydrOXYzine (ATARAX/VISTARIL) tablet 25 mg  25 mg Oral Q6H PRN Loletha Grayer, MD   25 mg at 07/01/17 0844  . ipratropium-albuterol (DUONEB) 0.5-2.5 (3) MG/3ML nebulizer solution 3 mL  3 mL Nebulization Q6H WA Loletha Grayer, MD   3 mL at 07/02/17 5697  . levothyroxine (SYNTHROID, LEVOTHROID) tablet 75 mcg  75 mcg Oral QAC breakfast Loletha Grayer, MD   75 mcg at 07/02/17 0856  . metroNIDAZOLE (FLAGYL) IVPB 500 mg  500 mg  Intravenous Q8H Loletha Grayer, MD   Stopped at 07/02/17 1021  . mometasone-formoterol (DULERA) 200-5 MCG/ACT inhaler 2 puff  2 puff Inhalation BID Loletha Grayer, MD   2 puff at 07/02/17 [phone]  . mupirocin ointment (BACTROBAN) 2 % 1 application  1 application Nasal BID Lifsey, Hannah C, RPH      . nystatin cream (MYCOSTATIN) 1 application  1 application Topical X6P PRN Wieting, Richard, MD      . pantoprazole (PROTONIX) EC tablet 40 mg  40 mg Oral BID Flora Lipps, MD   40 mg at 07/01/17 2259  . PARoxetine (PAXIL) tablet 30 mg  30 mg Oral Daily Loletha Grayer, MD   30 mg at 07/01/17 1153  . QUEtiapine (SEROQUEL) tablet 25 mg  25 mg Oral QHS Loletha Grayer, MD   25 mg at 07/01/17 2300  . saccharomyces boulardii (FLORASTOR) capsule 250 mg  250 mg Oral BID Loletha Grayer, MD   250 mg at 07/01/17 2259  . traZODone (DESYREL) tablet 50 mg  50 mg Oral QHS PRN Wieting, Richard, MD      . vancomycin (VANCOCIN) 50 mg/mL oral solution 125 mg  125 mg Oral Q6H Loletha Grayer, MD   125 mg at 07/02/17 5374     Discharge Medications: Please see discharge summary for a list of discharge medications.  Relevant Imaging Results:  Relevant Lab Results:   Additional Information    Shela Leff, LCSW

## 2017-07-02 NOTE — Clinical Social Work Note (Signed)
Clinical Social Work Assessment  Patient Details  Name: Kathy Guzman MRN: [id number] Date of Birth: [birth year]  Date of referral:  07/02/17               Reason for consult:  Facility Placement                Permission sought to share information with:    Permission granted to share information::     Name::        Agency::     Relationship::     Contact Information:     Housing/Transportation Living arrangements for the past 2 months:  [place] of Information:  Adult Children Patient Interpreter Needed:  None Criminal Activity/Legal Involvement Pertinent to Current Situation/Hospitalization:  No - Comment as needed Significant Relationships:  Adult Children Lives with:  Facility Resident Do you feel safe going back to the place where you live?  Yes Need for family participation in patient care:  Yes (Comment)  Care giving concerns:  Patient is a long term resident at [facility] for 3 years.    Social Worker assessment / plan:  CSW spoke with patient's daughters: Kathy Guzman [phone]) and Kathy Guzman [phone]) this morning and explained role and purpose of visit. CSW spoke with patient's daughters about the request for bipap that was made from the ICU MD for their mother to have at [facility]. CSW informed them that CSW was told by Magda Paganini at [facility] that they could accommodate this request. Upon saying this, the daughters informed CSW that [facility] was informed about the need for a bipap back in July and that they were told that patient would need a sleep study first and that the bipap representative had told them that the machine that was at their facility was not working. CSW spoke with Magda Paganini again informing her about this new information and if for definite they would be able to accommodate the bipap or not at their facility when patient returns. Magda Paganini assured this CSW that there would be no issue getting the bipap for patient to have  upon patient's return.   CSW spoke with patient's daughters offering supportive listening and will continue to follow.  Employment status:  Retired Nurse, adult PT Recommendations:  Not assessed at this time Information / Referral to community resources:     Patient/Family's Response to care:  Patient's daughter expressed appreciation for CSW assistance.  Patient/Family's Understanding of and Emotional Response to Diagnosis, Current Treatment, and Prognosis:  Patient's daughters are very involved in their mother's care.  Emotional Assessment Appearance:  Appears stated age Attitude/Demeanor/Rapport:   (unable to speak; asleep and on bipap) Affect (typically observed):    Orientation:    Alcohol / Substance use:  Not Applicable Psych involvement (Current and /or in the community):  No (Comment)  Discharge Needs  Concerns to be addressed:  Care Coordination Readmission within the last 30 days:  No Current discharge risk:  None Barriers to Discharge:  No Barriers Identified   Shela Leff, [place] 07/02/2017, 12:21 PM

## 2017-07-02 NOTE — Progress Notes (Signed)
Palliative Medicine Team  Due to high volume of referrals, there is a delay seeing this patient. PMT not at [facility] over the weekend but will arrange goals of care with patient and family on Monday. If discharged over the weekend, may benefit from outpatient palliative referral for initial goals of care. Thank you for the opportunity to participate in the care of [name].   Ihor Dow, FNP-C Palliative Medicine Team  Phone: [phone] Fax: [phone]

## 2017-07-02 NOTE — Progress Notes (Signed)
Patient ID: Kathy Guzman, female   DOB: October 20, 1935, 82 y.o.   MRN: [id number]  Case discussed with Dr. Mortimer Fries critical care specialist. Palliative care consultation, CT scan of the head, continuous BiPAP for now. Transfer back to the ICU.  Case discussed with respiratory therapist with ABG results.  Dr. Loletha Grayer

## 2017-07-02 NOTE — Care Management (Addendum)
RNCM consult for follow up on CPAP delay. I have left message at Dr. Blane Ohara office (PCP) to see if they know anything about this need.  This is usually an outpatient arrangement with SNF. RNCM will continue to follow. [id number]: patient saw Dr. Ginette Pitman at [facility] in 2015; they have not seen her since. Dr. Ouida Sills has followed her "home care visit" 04/15/2017 at [facility]. Nurse will send message to Dr. Ouida Sills. 8/31- Received call back from Dr. Ouida Sills with [facility]- he is not aware that patient was needing a CPAP (patient last seen by him 04/15/17 at [facility]).  This will need to be arranged through him as he is patient's PCP (as outpatient). If patient need BiPAP, CSW can arrange with [facility] however CSW will need to know ASAP as it could take time to arrange.

## 2017-07-02 NOTE — Progress Notes (Signed)
Patient ID: Kathy Guzman, female   DOB: 1935-01-30, 82 y.o.   MRN: [id number]  [employer] PROGRESS NOTE  Kathy Guzman [id number]:[id number] DOB: 06-20-1935 DOA: 06/30/2017 PCP: Kathy Ramsay, MD  HPI/Subjective: As per family, the patient has not slept in 2 nights. Patient's given a sternal rub and opened her eyes and started talking to her daughter. Did not answer my questions. Close her eyes back easily. Some episodes of tremor upper extremities. Patient currently on BiPAP.   Objective: Vitals:   07/01/17 2354 07/02/17 0518  BP:  (!) 102/28  Pulse:  80  Resp:  20  Temp:  98.9 F (37.2 C)  SpO2: 97% 95%    Filed Weights   06/30/17 1830 06/30/17 2116  Weight: (!) 161.3 kg (355 lb 8 oz) (!) 151.3 kg (333 lb 8.9 oz)    ROS: Review of Systems  Unable to perform ROS: Acuity of condition   Exam: Physical Exam  Constitutional: She appears lethargic.  HENT:  Difficult to look and health today secondary to altered mental status  Eyes: Conjunctivae and lids are normal.  Neck: Carotid bruit is not present. No thyromegaly present.  Cardiovascular: Regular rhythm, S1 normal and S2 normal.   No murmur heard. Respiratory: She has decreased breath sounds in the right lower field and the left lower field. She has no wheezes. She has no rhonchi.  GI: Soft. Bowel sounds are normal. There is no tenderness.  Musculoskeletal:       Right ankle: She exhibits swelling.       Left ankle: She exhibits swelling.  Neurological: She appears lethargic.  Tremor or shaking upper extremities  Skin: Skin is warm. No rash noted. Nails show no clubbing.  Psychiatric:  Lethargic      Data Reviewed: Basic Metabolic Panel:  Recent Labs Lab 06/30/17 1832 07/01/17 0439 07/02/17 0504  NA 138 140 142  K 4.8 5.0 4.3  CL 100* 101 102  CO2 29 30 30   GLUCOSE 142* 140* 101*  BUN 48* 46* 53*  CREATININE 2.09* 1.94* 2.21*  CALCIUM 9.0 8.8* 8.8*   Liver Function Tests:  Recent Labs Lab  06/30/17 1832  AST 12*  ALT 11*  ALKPHOS 118  BILITOT 0.5  PROT 6.3*  ALBUMIN 2.8*   CBC:  Recent Labs Lab 06/30/17 1832 07/01/17 0439 07/02/17 0504  WBC 10.9 10.1 11.0  HGB 9.5* 11.0* 10.1*  HCT 30.5* 34.7* 31.6*  MCV 104.3* 101.2* 98.3  PLT 269 252 257   Cardiac Enzymes:  Recent Labs Lab 06/30/17 1832  TROPONINI 0.04*   BNP (last 3 results)  Recent Labs  05/06/17 1553 06/30/17 2154  BNP 234.0* 370.0*     CBG:  Recent Labs Lab 06/30/17 2110  GLUCAP 135*    Recent Results (from the past 240 hour(s))  MRSA PCR Screening     Status: Abnormal   Collection Time: 06/30/17  9:35 PM  Result Value Ref Range Status   MRSA by PCR POSITIVE (A) NEGATIVE Final    Comment:        The GeneXpert MRSA Assay (FDA approved for NASAL specimens only), is one component of a comprehensive MRSA colonization surveillance program. It is not intended to diagnose MRSA infection nor to guide or monitor treatment for MRSA infections. CRITICAL RESULT CALLED TO, READ BACK BY AND VERIFIED WITH: TESS THOMAS AT 2339 06/30/17 ALV   C difficile quick scan w PCR reflex     Status: Abnormal   Collection Time: 07/01/17  5:30 PM  Result Value Ref Range Status   C Diff antigen POSITIVE (A) NEGATIVE Final   C Diff toxin NEGATIVE NEGATIVE Final   C Diff interpretation Results are indeterminate. See PCR results.  Final  Clostridium Difficile by PCR     Status: Abnormal   Collection Time: 07/01/17  5:30 PM  Result Value Ref Range Status   Toxigenic C Difficile by pcr POSITIVE (A) NEGATIVE Final    Comment: Positive for toxigenic C. difficile with little to no toxin production. Only treat if clinical presentation suggests symptomatic illness.     Studies: Dg Chest 1 View  Result Date: 06/30/2017 CLINICAL DATA:  Lethargy, COPD EXAM: CHEST 1 VIEW COMPARISON:  05/07/2017 FINDINGS: Cardiomegaly with atherosclerosis. Patchy atelectasis at the right base. No focal consolidation. No large  effusion. No pneumothorax. Left basilar opacity unchanged. IMPRESSION: 1. No change in cardiomegaly. 2. Patchy left greater than right bibasilar opacity, may reflect atelectasis, no change from prior Electronically Signed   By: Donavan Foil M.D.   On: 06/30/2017 19:04    Scheduled Meds: . allopurinol  100 mg Oral Daily  . carvedilol  3.125 mg Oral BID  . diltiazem  240 mg Oral Daily  . feeding supplement (PRO-STAT SUGAR FREE 64)  30 mL Oral BID  . fluticasone  2 spray Each Nare Daily  . furosemide  40 mg Oral BID  . gabapentin  200 mg Oral BID  . ipratropium-albuterol  3 mL Nebulization Q6H WA  . levothyroxine  75 mcg Oral QAC breakfast  . methylPREDNISolone (SOLU-MEDROL) injection  20 mg Intravenous Daily  . mometasone-formoterol  2 puff Inhalation BID  . pantoprazole  40 mg Oral BID  . PARoxetine  30 mg Oral Daily  . QUEtiapine  25 mg Oral QHS  . saccharomyces boulardii  250 mg Oral BID  . vancomycin  125 mg Oral Q6H   Continuous Infusions: . metronidazole      Assessment/Plan:  1. Acute encephalopathy, acute hypercarbic respiratory failure. Stat ABG. May have to go back to the ICU because the patient is on BiPAP currently. Await ABG results first. 2. C. difficile colitis. IV Flagyl and by mouth vancomycin if able to take. 3. COPD exacerbation. Stop Solu-Medrol secondary to altered mental status and poor sleeping. 4. Relative hypotension. Hold Lasix this morning. Rate controlling meds only if able to take. 5. History of atrial fibrillation on Coreg and Cardizem if blood pressure allows 6. Upper GI bleed with melena. GI does not want to do any procedures. Eliquis on hold. Higher risk of stroke while off Eliquis. 7. Morbid obesity 8. Hypothyroidism unspecified on levothyroxine 9. Chronic kidney disease stage IV  Code Status:     Code Status Orders        Start     Ordered   06/30/17 2113  Do not attempt resuscitation (DNR)  Continuous    Question Answer Comment  In the  event of cardiac or respiratory ARREST Do not call a "code blue"   In the event of cardiac or respiratory ARREST Do not perform Intubation, CPR, defibrillation or ACLS   In the event of cardiac or respiratory ARREST Use medication by any route, position, wound care, and other measures to relive pain and suffering. May use oxygen, suction and manual treatment of airway obstruction as needed for comfort.   Comments Nurse may pronounce      06/30/17 2113    Code Status History    Date Active Date Inactive Code Status  Order ID Comments User Context   06/30/2017  9:12 PM 06/30/2017  9:13 PM DNR [id number]  Loletha Grayer, MD Inpatient   05/06/2017  9:32 PM 05/16/2017  5:13 PM DNR [id number]  Dustin Flock, MD Inpatient   02/07/2016  4:30 PM 02/11/2016  8:16 PM DNR [id number]  Hillary Bow, MD ED   01/07/2016 10:25 PM 01/13/2016  7:22 PM Full Code [id number]  Theodoro Grist, MD ED    Advance Directive Documentation     Most Recent Value  Type of Advance Directive  Out of facility DNR (pink MOST or yellow form)  Pre-existing out of facility DNR order (yellow form or pink MOST form)  -  "MOST" Form in Place?  -     Family Communication: 2 daughters at the bedside  Disposition Plan: May need transfer back to the ICU for BiPAP during the day  Antibiotics:  IV Flagyl  By mouth vancomycin  Time spent: 32 minutes. Patient may need transfer back to the ICU pending on ABG results and patient requiring BiPAP during the day while sleeping.  Loletha Grayer  [employer]

## 2017-07-02 NOTE — Progress Notes (Signed)
Name: Kathy Guzman MRN: [id number] DOB: 05/28/1935    ADMISSION DATE:  06/30/2017  BRIEF PATIENT DESCRIPTION:  82 year old female with acute on chronic respiratory failure secondary to COPD exacerbation and Obesity hypoventilation syndrome requiring continuous Bipap   SIGNIFICANT EVENTS  8/29-Patient admitted to the SDU with Acute On chronic respiratory failure secondary to COPD Exacerbation 08/30-Pt transferred to [place] Unit  08/31-Pt transferred back to the Stepdown Unit   STUDIES:  02/08/2016 ECHO>>Not well visualized. The cavity size was normal.  Systolic function was normal. The estimated ejection fraction was  in the range of 60% to 65%  HISTORY OF PRESENT ILLNESS:   82 year old female with known history of Anxiety, COPD, Depression, GERD, Gout, Hyperlipidemia, Hypertension and Hypothyroidism.  Pt was brought to [facility] 8/29 with altered mental status.  Patient was awake for breakfast and lunch but when family got there around 5 she was unable to be woken up.  She has been confused.  Patient was placed on Bipap in the ED. Patient started to be more awake. Patient was noted to have black tarry stool in the ED. Patient admitted to the SDU for closer monitoring.  REVIEW OF SYSTEMS:   Unable to assess pt on continuous Bipap   SUBJECTIVE:  Unable to assess pt on continuous Bipap  VITAL SIGNS: Temp:  [98.1 F (36.7 C)-98.9 F (37.2 C)] 98.9 F (37.2 C) (08/31 0518) Pulse Rate:  [78-89] 80 (08/31 0518) Resp:  [16-20] 20 (08/31 0518) BP: (95-158)/(28-141) 102/28 (08/31 0518) SpO2:  [92 %-100 %] 95 % (08/31 0518) FiO2 (%):  [32 %] 32 % (08/31 0048)  PHYSICAL EXAMINATION: General: Morbidly obese caucasian female, on continuous Bipap  Neuro: Lethargic, follows commands  HEENT: Supple, no JVD  Cardiovascular: S1S2, RRR, no M/R/G Lungs: Diminished air entry throughout, even, non labored on Bipap; no wheezes,crackles,rhonchi Abdomen: +BS x4, obese, non tender, non distended    Musculoskeletal: 2+ bilateral lower extremity edema, moves all extremities Skin:  Warm scattered ecchymosis    Recent Labs Lab 06/30/17 1832 07/01/17 0439 07/02/17 0504  NA 138 140 142  K 4.8 5.0 4.3  CL 100* 101 102  CO2 29 30 30   BUN 48* 46* 53*  CREATININE 2.09* 1.94* 2.21*  GLUCOSE 142* 140* 101*    Recent Labs Lab 06/30/17 1832 07/01/17 0439 07/02/17 0504  HGB 9.5* 11.0* 10.1*  HCT 30.5* 34.7* 31.6*  WBC 10.9 10.1 11.0  PLT 269 252 257   Dg Chest 1 View  Result Date: 06/30/2017 CLINICAL DATA:  Lethargy, COPD EXAM: CHEST 1 VIEW COMPARISON:  05/07/2017 FINDINGS: Cardiomegaly with atherosclerosis. Patchy atelectasis at the right base. No focal consolidation. No large effusion. No pneumothorax. Left basilar opacity unchanged. IMPRESSION: 1. No change in cardiomegaly. 2. Patchy left greater than right bibasilar opacity, may reflect atelectasis, no change from prior Electronically Signed   By: Donavan Foil M.D.   On: 06/30/2017 19:04    ASSESSMENT / PLAN: Acute on chronic hypercarbic/hypoxic respiratory failure secondary to COPD exacerbation and Obesity hypoventilation syndrome  Upper GI bleed-resolved  Atrial Fibrillation-resolved  Hx: CHF, GERD, and Hypothyroidism  P: Continuous Bipap for now wean as tolerated, will need Bipap qhs  Maintain O2 sats 88% to 92% Continue scheduled bronchodilator therapy  Prn CXR  Continue carvedilol and diltiazem  Continue protonix  Continue flagyl and vancomycin  Trend WBC and monitor fever curve Continue Synthroid Trend BMP Replace electrolytes as indicated  Monitor UOP   -At discharge pt will  require Bipap qhs due to hx of Obesity hypoventilation syndrome to prevent respiratory arrest which could potentially lead to death, and consistent use of Bipap qhs would likely decrease hospital readmissions.  Marda Stalker, [place] Pager [phone] (please enter 7 digits) PCCM Consult Pager [phone]  (please enter 7 digits)

## 2017-07-02 NOTE — Care Management (Signed)
Per Dr. Mortimer Fries patient would benefit from bipap at facility. CSW updated.

## 2017-07-02 NOTE — Progress Notes (Signed)
Called and gave report to [employer] RN in CCU

## 2017-07-02 NOTE — Progress Notes (Signed)
Removed C-1 CPAP from pt's room. Pt didn't feel like she was getting enough air and family was concerned about the "small machine on mama". I replaced the C-1 CPAP with the V-60 CPAP/BIPAP machine. Pt like the CPAP machine better and family felt better because the CPAP now had audible alarms.

## 2017-07-02 NOTE — Progress Notes (Signed)
Alert talking with daughter who is at bedside.  o2 sats 100% on high flow with no distress noted. Diet ordered by NP per patient's request.  Patient has been tolerating sips of water.  Patient to wear bipap at night.

## 2017-07-03 LAB — BASIC METABOLIC PANEL
Anion gap: 9 (ref 5–15)
BUN: 53 mg/dL — AB (ref 6–20)
CO2: 27 mmol/L (ref 22–32)
Calcium: 9 mg/dL (ref 8.9–10.3)
Chloride: 107 mmol/L (ref 101–111)
Creatinine, Ser: 1.9 mg/dL — ABNORMAL HIGH (ref 0.44–1.00)
GFR calc Af Amer: 28 mL/min — ABNORMAL LOW (ref >60)
GFR calc non Af Amer: 24 mL/min — ABNORMAL LOW (ref >60)
Glucose, Bld: 90 mg/dL (ref 65–99)
POTASSIUM: 5.5 mmol/L — AB (ref 3.5–5.1)
SODIUM: 143 mmol/L (ref 135–145)

## 2017-07-03 LAB — CBC WITH DIFFERENTIAL/PLATELET
Basophils Absolute: 0.2 10*3/uL — ABNORMAL HIGH (ref 0–0.1)
Basophils Relative: 2 %
EOS ABS: 0.7 10*3/uL (ref 0–0.7)
Eosinophils Relative: 5 %
HEMATOCRIT: 31.9 % — AB (ref 35.0–47.0)
Hemoglobin: 10 g/dL — ABNORMAL LOW (ref 12.0–16.0)
LYMPHS ABS: 3.4 10*3/uL (ref 1.0–3.6)
Lymphocytes Relative: 28 %
MCH: 30.6 pg (ref 26.0–34.0)
MCHC: 31.5 g/dL — AB (ref 32.0–36.0)
MCV: 97.2 fL (ref 80.0–100.0)
MONOS PCT: 8 %
Monocytes Absolute: 0.9 10*3/uL (ref 0.2–0.9)
NEUTROS ABS: 7.1 10*3/uL — AB (ref 1.4–6.5)
NEUTROS PCT: 57 %
Platelets: 216 10*3/uL (ref 150–440)
RBC: 3.28 MIL/uL — AB (ref 3.80–5.20)
RDW: 18.6 % — ABNORMAL HIGH (ref 11.5–14.5)
WBC: 12.3 10*3/uL — AB (ref 3.6–11.0)

## 2017-07-03 MED ORDER — DIPHENHYDRAMINE HCL 25 MG PO CAPS
25.0000 mg | ORAL_CAPSULE | Freq: Once | ORAL | Status: AC
  Administered 2017-07-03: 25 mg via ORAL
  Filled 2017-07-03: qty 1

## 2017-07-03 NOTE — Progress Notes (Signed)
Patient on HFNC to maintain O2 sats.  At bedtime, patient is on BiPap.  No distress noted. Daughter at bedside. Continues on enteric and contact precautions.
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
## 2017-07-03 NOTE — Progress Notes (Signed)
Brief Note Patient has done well overnight. On nasal canula. Will sign off.  Please call with questions.

## 2017-07-03 NOTE — Progress Notes (Signed)
Pt has remained alert and oriented with no c/o pain. Pt has been transitioned to [facility] from HFNC. 4LNC is basline. Lung sounds are diminished to auscultation. Pt has remained in Afib on cardiac monitor. Pt is alert and oriented to self and place with confusion at times. Pt has an external catheter d/t body habitus and irritation to skin folds. A flexy seal is in place d/t c.diff and skin integrity. Pt with orders to transfer to the floor.

## 2017-07-03 NOTE — Progress Notes (Signed)
Patient's potassium 5.5 and MD notified. Orders to check CBC and BMP in the am.

## 2017-07-04 LAB — BASIC METABOLIC PANEL
ANION GAP: 6 (ref 5–15)
BUN: 57 mg/dL — AB (ref 6–20)
CO2: 31 mmol/L (ref 22–32)
Calcium: 8.9 mg/dL (ref 8.9–10.3)
Chloride: 103 mmol/L (ref 101–111)
Creatinine, Ser: 1.64 mg/dL — ABNORMAL HIGH (ref 0.44–1.00)
GFR calc Af Amer: 33 mL/min — ABNORMAL LOW (ref >60)
GFR calc non Af Amer: 28 mL/min — ABNORMAL LOW (ref >60)
GLUCOSE: 111 mg/dL — AB (ref 65–99)
Potassium: 4.2 mmol/L (ref 3.5–5.1)
Sodium: 140 mmol/L (ref 135–145)

## 2017-07-04 LAB — CBC
HEMATOCRIT: 30.3 % — AB (ref 35.0–47.0)
Hemoglobin: 9.6 g/dL — ABNORMAL LOW (ref 12.0–16.0)
MCH: 31.5 pg (ref 26.0–34.0)
MCHC: 31.8 g/dL — ABNORMAL LOW (ref 32.0–36.0)
MCV: 98.9 fL (ref 80.0–100.0)
PLATELETS: 215 10*3/uL (ref 150–440)
RBC: 3.06 MIL/uL — AB (ref 3.80–5.20)
RDW: 18 % — AB (ref 11.5–14.5)
WBC: 11.6 10*3/uL — AB (ref 3.6–11.0)

## 2017-07-04 MED ORDER — DILTIAZEM HCL ER COATED BEADS 120 MG PO CP24
120.0000 mg | ORAL_CAPSULE | Freq: Every day | ORAL | Status: DC
  Administered 2017-07-05: 120 mg via ORAL
  Filled 2017-07-04: qty 1

## 2017-07-04 NOTE — Progress Notes (Signed)
Patient ID: Kathy Guzman, female   DOB: 1934-12-13, 82 y.o.   MRN: [id number]  [facility] PROGRESS NOTE  Kathy Guzman FBP:[id number] DOB: 1935-02-28 DOA: 06/30/2017 PCP: Leonel Ramsay, MD  HPI/Subjective: Patient feels okay and offers no complaints. Her blood pressures been running on the lower side. Looks like having less output out of the rectal tube. Some shortness of breath and cough.  Objective: Vitals:   07/04/17 0908 07/04/17 0912  BP: (!) 92/56 95/65  Pulse: (!) 102 88  Resp: 18   Temp: 98.4 F (36.9 C)   SpO2: 98%     Filed Weights   06/30/17 1830 06/30/17 2116 07/02/17 1027  Weight: (!) 161.3 kg (355 lb 8 oz) (!) 151.3 kg (333 lb 8.9 oz) (!) 151.3 kg (333 lb 8.9 oz)    ROS: Review of Systems  Constitutional: Negative for chills and fever.  Eyes: Negative for blurred vision.  Respiratory: Negative for cough and shortness of breath.   Cardiovascular: Negative for chest pain.  Gastrointestinal: Positive for diarrhea. Negative for abdominal pain, constipation, nausea and vomiting.  Genitourinary: Negative for dysuria.  Musculoskeletal: Negative for joint pain.  Neurological: Negative for dizziness and headaches.   Exam: Physical Exam  HENT:  Nose: No mucosal edema.  Mouth/Throat: No oropharyngeal exudate.  Eyes: Pupils are equal, round, and reactive to light. Conjunctivae and lids are normal.  Neck: Carotid bruit is not present. No thyromegaly present.  Cardiovascular: Regular rhythm, S1 normal and S2 normal.   No murmur heard. Pulses:      Dorsalis pedis pulses are 1+ on the right side, and 1+ on the left side.  Respiratory: She has decreased breath sounds in the right lower field and the left lower field. She has no wheezes. She has no rhonchi.  GI: Soft. Bowel sounds are normal. There is no tenderness.  Musculoskeletal:       Right ankle: She exhibits swelling.       Left ankle: She exhibits swelling.  Neurological: She is alert. She  displays no tremor.  Skin: Skin is warm. No rash noted. Nails show no clubbing.  Psychiatric: She has a normal mood and affect.      Data Reviewed: Basic Metabolic Panel:  Recent Labs Lab 06/30/17 1832 07/01/17 0439 07/02/17 0504 07/03/17 0510 07/04/17 0604  NA 138 140 142 143 140  K 4.8 5.0 4.3 5.5* 4.2  CL 100* 101 102 107 103  CO2 29 30 30 27 31   GLUCOSE 142* 140* 101* 90 111*  BUN 48* 46* 53* 53* 57*  CREATININE 2.09* 1.94* 2.21* 1.90* 1.64*  CALCIUM 9.0 8.8* 8.8* 9.0 8.9   Liver Function Tests:  Recent Labs Lab 06/30/17 1832  AST 12*  ALT 11*  ALKPHOS 118  BILITOT 0.5  PROT 6.3*  ALBUMIN 2.8*   CBC:  Recent Labs Lab 06/30/17 1832 07/01/17 0439 07/02/17 0504 07/03/17 0510 07/04/17 0604  WBC 10.9 10.1 11.0 12.3* 11.6*  NEUTROABS  --   --   --  7.1*  --   HGB 9.5* 11.0* 10.1* 10.0* 9.6*  HCT 30.5* 34.7* 31.6* 31.9* 30.3*  MCV 104.3* 101.2* 98.3 97.2 98.9  PLT 269 252 257 216 215   Cardiac Enzymes:  Recent Labs Lab 06/30/17 1832  TROPONINI 0.04*   BNP (last 3 results)  Recent Labs  05/06/17 1553 06/30/17 2154  BNP 234.0* 370.0*     CBG:  Recent Labs Lab 06/30/17 2110 07/02/17 1020  GLUCAP 135* 91  Recent Results (from the past 240 hour(s))  MRSA PCR Screening     Status: Abnormal   Collection Time: 06/30/17  9:35 PM  Result Value Ref Range Status   MRSA by PCR POSITIVE (A) NEGATIVE Final    Comment:        The GeneXpert MRSA Assay (FDA approved for NASAL specimens only), is one component of a comprehensive MRSA colonization surveillance program. It is not intended to diagnose MRSA infection nor to guide or monitor treatment for MRSA infections. CRITICAL RESULT CALLED TO, READ BACK BY AND VERIFIED WITH: TESS THOMAS AT 2339 06/30/17 ALV   C difficile quick scan w PCR reflex     Status: Abnormal   Collection Time: 07/01/17  5:30 PM  Result Value Ref Range Status   C Diff antigen POSITIVE (A) NEGATIVE Final   C Diff  toxin NEGATIVE NEGATIVE Final   C Diff interpretation Results are indeterminate. See PCR results.  Final  Clostridium Difficile by PCR     Status: Abnormal   Collection Time: 07/01/17  5:30 PM  Result Value Ref Range Status   Toxigenic C Difficile by pcr POSITIVE (A) NEGATIVE Final    Comment: Positive for toxigenic C. difficile with little to no toxin production. Only treat if clinical presentation suggests symptomatic illness.      Scheduled Meds: . allopurinol  100 mg Oral Daily  . apixaban  2.5 mg Oral BID  . Chlorhexidine Gluconate Cloth  6 each Topical Q0600  . [START ON 07/05/2017] diltiazem  120 mg Oral Daily  . feeding supplement (PRO-STAT SUGAR FREE 64)  30 mL Oral BID  . fluticasone  2 spray Each Nare Daily  . gabapentin  200 mg Oral BID  . ipratropium-albuterol  3 mL Nebulization Q6H WA  . levothyroxine  75 mcg Oral QAC breakfast  . mometasone-formoterol  2 puff Inhalation BID  . mupirocin ointment  1 application Nasal BID  . pantoprazole  40 mg Oral BID  . PARoxetine  30 mg Oral Daily  . QUEtiapine  25 mg Oral QHS  . saccharomyces boulardii  250 mg Oral BID  . vancomycin  125 mg Oral Q6H    Assessment/Plan:  1. Acute encephalopathy, acute hypercarbic respiratory failure. Oxygen via nasal cannula during the day. Will need BiPAP at night.  BiPAP or noninvasive ventilation such as Trilogy machine will have to be set up at night prior to disposition in order to prevent readmissions. 2. C. difficile colitis. Since more alert today likely can just use oral vancomycin. 3. COPD. Continue nebulizers 4. Relative hypotension. Cardizem only for heart rate control. All of the blood pressure medications discontinued 5. History of atrial fibrillation on on Cardizem. Watch hemoglobin closely with restarting Eliquis. 6. Morbid obesity 7. Hypothyroidism unspecified on levothyroxine 8. Chronic kidney disease stage IV. Creatinine actually improved today to 1.64 with a GFR of  33. 9. Black stools could be secondary to C. difficile rather than GI bleeding. Watch closely on Eliquis. So far hemoglobin okay at 9.6  Code Status:     Code Status Orders        Start     Ordered   06/30/17 2113  Do not attempt resuscitation (DNR)  Continuous    Question Answer Comment  In the event of cardiac or respiratory ARREST Do not call a "code blue"   In the event of cardiac or respiratory ARREST Do not perform Intubation, CPR, defibrillation or ACLS   In the event of cardiac or  respiratory ARREST Use medication by any route, position, wound care, and other measures to relive pain and suffering. May use oxygen, suction and manual treatment of airway obstruction as needed for comfort.   Comments Nurse may pronounce      06/30/17 2113    Code Status History    Date Active Date Inactive Code Status Order ID Comments User Context   06/30/2017  9:12 PM 06/30/2017  9:13 PM DNR [id number]  Loletha Grayer, MD Inpatient   05/06/2017  9:32 PM 05/16/2017  5:13 PM DNR [id number]  Dustin Flock, MD Inpatient   02/07/2016  4:30 PM 02/11/2016  8:16 PM DNR [id number]  Hillary Bow, MD ED   01/07/2016 10:25 PM 01/13/2016  7:22 PM Full Code [id number]  Theodoro Grist, MD ED    Advance Directive Documentation     Most Recent Value  Type of Advance Directive  Out of facility DNR (pink MOST or yellow form)  Pre-existing out of facility DNR order (yellow form or pink MOST form)  -  "MOST" Form in Place?  -     Family Communication: left message for [name] her daughter Disposition Plan: rectal tube will need to be removed prior to disposition back to facility. Also will have to have noninvasive ventilation set up prior to disposition  Antibiotics:  By mouth vancomycin  Time spent: 27 minutes.   Loletha Grayer  [employer]

## 2017-07-05 DIAGNOSIS — Z515 Encounter for palliative care: Secondary | ICD-10-CM

## 2017-07-05 DIAGNOSIS — J189 Pneumonia, unspecified organism: Secondary | ICD-10-CM

## 2017-07-05 DIAGNOSIS — Z7189 Other specified counseling: Secondary | ICD-10-CM

## 2017-07-05 LAB — HEMOGLOBIN: Hemoglobin: 9.6 g/dL — ABNORMAL LOW (ref 12.0–16.0)

## 2017-07-05 MED ORDER — DILTIAZEM HCL ER COATED BEADS 120 MG PO CP24: 120.0000 mg | ORAL_CAPSULE | Freq: Every day | ORAL | 0 refills | Status: AC

## 2017-07-05 MED ORDER — VANCOMYCIN 50 MG/ML ORAL SOLUTION: 125.0000 mg | Freq: Four times a day (QID) | ORAL | 0 refills | Status: AC

## 2017-07-05 MED ORDER — APIXABAN 2.5 MG PO TABS: 2.5000 mg | ORAL_TABLET | Freq: Two times a day (BID) | ORAL | 0 refills | Status: AC

## 2017-07-05 NOTE — Progress Notes (Signed)
EMS here for patient. Daughter at bedside.  

## 2017-07-05 NOTE — Clinical Social Work Note (Addendum)
Patient is a long term car resident at [facility], plan for her to return back to SNF.  CSW spoke to [facility] admission worker Tiffany and told her that patient will need to be on a Bipap at night.  She confirmed there is one available that patient can use if she discharges today.  CSW faxed Bipap settings to [facility] to ensure they have it.  CSW continuing to follow patient's progress throughout discharge planning.  Jones Broom. [place], MSW, [place]  07/05/2017 10:21 AM

## 2017-07-05 NOTE — Discharge Summary (Signed)
[place] [facility] [place] NAME: Kathy Guzman    MR#:  [id number]  DATE OF BIRTH:  [birth year]  DATE OF ADMISSION:  06/30/2017 ADMITTING PHYSICIAN: Loletha Grayer, MD  DATE OF DISCHARGE: 07/05/2017  PRIMARY CARE PHYSICIAN: Leonel Ramsay, MD    ADMISSION DIAGNOSIS:  Elevated troponin [R74.8] HCAP (healthcare-associated pneumonia) [J18.9] Gastrointestinal hemorrhage with melena [K92.1] Acute respiratory failure with hypoxia and hypercarbia ([place]) [J96.01, J96.02] Altered mental status, unspecified altered mental status type [R41.82]  DISCHARGE DIAGNOSIS:  Active Problems:   Acute respiratory failure with hypercapnia (HCC)   Gastrointestinal hemorrhage with melena   Altered mental status   SECONDARY DIAGNOSIS:   Past Medical History:  Diagnosis Date  . Anxiety   . Atrial fibrillation ([place])   . CHF (congestive heart failure) ([place])   . COPD (chronic obstructive pulmonary disease) ([place])   . Depression   . GERD (gastroesophageal reflux disease)   . Gout   . Hyperlipidemia   . Hypertension   . Hypothyroid   . Osteoarthritis   . Recurrent UTI   . Skin cancer   . Urethral stricture     HOSPITAL COURSE:   1. Acute encephalopathy with acute hypercarbic respiratory failure. Patient was sent into the hospital because she was unable to be woken up. Her CO2 was found to be elevated and 94. She was started on BiPAP and admitted to the ICU. She was then sent out of the ICU and had to be sent back to the ICU for CO2 retention again. The patient is very sensitive to rising PCO2. The patient will need BiPAP 20/8 40% oxygen at night and when she is sleeping. During the day she can be on 3 L nasal cannula. I'd rather have her more hypoxic. Palliative care can follow over at the facility. 2. C. difficile colitis. Oral vancomycin 125 mg by mouth every 6 hours for another 10 days. This can be pill form or liquid form whatever your pharmacy has. 3. COPD.  Continue nebulizers. 4. Relative hypotension. All of her antihypertensive medications were stopped except for Cardizem. 5. History of atrial fibrillation on Cardizem. Watch hemoglobin closely with restarting Eliquis. Recommend checking a hemoglobin every couple weeks. 6. Hypothyroidism unspecified on levothyroxin 7. Morbid obesity. 8. Chronic kidney disease stage IV. Creatinine actually improved to 1.64 with a GFR of 33 9. Black stools on presentation likely secondary to Clostridium difficile colitis rather than GI bleed. Hemoglobin stable at 9.6 even restarting Eliquis. Black stools could also be secondary to iron.  DISCHARGE CONDITIONS:  Fair  CONSULTS OBTAINED:   critical care specialist Gastroenterologist  DRUG ALLERGIES:   Allergies  Allergen Reactions  . Nsaids Other (See Comments)    Reaction:  Unknown   . Tramadol Itching    DISCHARGE MEDICATIONS:   Current Discharge Medication List    START taking these medications   Details  vancomycin (VANCOCIN) 50 mg/mL oral solution Take 2.5 mLs (125 mg total) by mouth every 6 (six) hours. Qty: 1000 mL, Refills: 0      CONTINUE these medications which have CHANGED   Details  apixaban (ELIQUIS) 2.5 MG TABS tablet Take 1 tablet (2.5 mg total) by mouth 2 (two) times daily. Qty: 60 tablet, Refills: 0    diltiazem (CARDIZEM CD) 120 MG 24 hr capsule Take 1 capsule (120 mg total) by mouth daily. Qty: 30 capsule, Refills: 0      CONTINUE these medications which have NOT CHANGED   Details  acetaminophen (  TYLENOL) 325 MG tablet Take 650 mg by mouth every 4 (four) hours as needed.    albuterol (PROVENTIL HFA;VENTOLIN HFA) 108 (90 BASE) MCG/ACT inhaler Inhale 2 puffs into the lungs every 4 (four) hours as needed for wheezing or shortness of breath.    allopurinol (ZYLOPRIM) 100 MG tablet Take 100 mg by mouth daily.     alum & mag hydroxide-simeth (MAALOX/MYLANTA) 200-200-20 MG/5ML suspension Take 30 mLs by mouth every 4 (four)  hours as needed for indigestion or heartburn.    bisacodyl (DULCOLAX) 5 MG EC tablet Take 1 tablet (5 mg total) by mouth daily as needed for moderate constipation. Qty: 30 tablet, Refills: 0    budesonide-formoterol (SYMBICORT) 160-4.5 MCG/ACT inhaler Inhale 2 puffs into the lungs 2 (two) times daily.    calcium carbonate (TUMS) 500 MG chewable tablet Chew 1 tablet by mouth every 4 (four) hours as needed for indigestion or heartburn.    cyanocobalamin 500 MCG tablet Take 500 mcg by mouth 2 (two) times daily.    ferrous sulfate 325 (65 FE) MG tablet Take 325 mg by mouth 2 (two) times daily with a meal.     fluticasone (FLONASE) 50 MCG/ACT nasal spray Place 2 sprays into both nostrils daily. Refills: 2    furosemide (LASIX) 20 MG tablet Take 2 tablets (40 mg total) by mouth daily. Qty: 30 tablet    gabapentin (NEURONTIN) 100 MG capsule Take 200 mg by mouth 2 (two) times daily.     hydrOXYzine (ATARAX/VISTARIL) 25 MG tablet Take 25 mg by mouth every 6 (six) hours as needed.    ipratropium-albuterol (DUONEB) 0.5-2.5 (3) MG/3ML SOLN Take 3 mLs by nebulization every 6 (six) hours. Qty: 360 mL    levothyroxine (SYNTHROID, LEVOTHROID) 75 MCG tablet Take 75 mcg by mouth daily before breakfast.    nystatin cream (MYCOSTATIN) Apply 1 application topically every 8 (eight) hours as needed (for yeast). Pt applies to all skin folds.    omeprazole (PRILOSEC) 20 MG capsule Take 20 mg by mouth daily.     PARoxetine (PAXIL) 30 MG tablet Take 30 mg by mouth daily.    QUEtiapine (SEROQUEL) 25 MG tablet Take 25 mg by mouth at bedtime.     saccharomyces boulardii (FLORASTOR) 250 MG capsule Take 250 mg by mouth 2 (two) times daily.    Skin Protectants, Misc. (CALAZIME SKIN PROTECTANT EX) Apply 1 application topically 4 (four) times daily as needed (for rash). Pt applies to buttocks.    Vitamin D, Ergocalciferol, (DRISDOL) 50000 units CAPS capsule Take 50,000 Units by mouth every 30 (thirty) days.     Amino Acids-Protein Hydrolys (FEEDING SUPPLEMENT, PRO-STAT SUGAR FREE 64,) LIQD Take 30 mLs by mouth 2 (two) times daily. Qty: 900 mL, Refills: 0    Cranberry-Vitamin C-Inulin (UTI-STAT) LIQD Take 30 mLs by mouth daily.      STOP taking these medications     carvedilol (COREG) 3.125 MG tablet      docusate sodium (COLACE) 100 MG capsule      losartan (COZAAR) 50 MG tablet      valsartan (DIOVAN) 80 MG tablet          DISCHARGE INSTRUCTIONS:   Follow-up Dr. rehabilitation one day  If you experience worsening of your admission symptoms, develop shortness of breath, life threatening emergency, suicidal or homicidal thoughts you must seek medical attention immediately by calling 911 or calling your MD immediately  if symptoms less severe.  You Must read complete instructions/literature along with all the  possible adverse reactions/side effects for all the Medicines you take and that have been prescribed to you. Take any new Medicines after you have completely understood and accept all the possible adverse reactions/side effects.   Please note  You were cared for by a hospitalist during your hospital stay. If you have any questions about your discharge medications or the care you received while you were in the hospital after you are discharged, you can call the unit and asked to speak with the hospitalist on call if the hospitalist that took care of you is not available. Once you are discharged, your primary care physician will handle any further medical issues. Please note that NO REFILLS for any discharge medications will be authorized once you are discharged, as it is imperative that you return to your primary care physician (or establish a relationship with a primary care physician if you do not have one) for your aftercare needs so that they can reassess your need for medications and monitor your lab values.    Today   CHIEF COMPLAINT:   Chief Complaint  Patient presents with   . COPD  . Altered Mental Status    HISTORY OF PRESENT ILLNESS:  Kathy Guzman  is a 82 y.o. female with a known history of COPD presented with altered mental status and unable to wake her up   VITAL SIGNS:  Blood pressure (!) 127/59, pulse 85, temperature 98.3 F (36.8 C), temperature source Oral, resp. rate 18, height 5\' 2"  (1.575 m), weight (!) 151.3 kg (333 lb 8.9 oz), SpO2 93 %.    PHYSICAL EXAMINATION:  GENERAL:  82 y.o.-year-old patient lying in the bed with no acute distress.  EYES: Pupils equal, round, reactive to light and accommodation. No scleral icterus. Extraocular muscles intact.  HEENT: Head atraumatic, normocephalic. Oropharynx and nasopharynx clear.  NECK:  Supple, no jugular venous distention. No thyroid enlargement, no tenderness.  LUNGS: Decreased breath sounds bilaterally, no wheezing, rales,rhonchi or crepitation. No use of accessory muscles of respiration.  CARDIOVASCULAR: S1, S2 normal. No murmurs, rubs, or gallops.  ABDOMEN: Soft, non-tender, non-distended. Bowel sounds present. No organomegaly or mass.  EXTREMITIES: 3+ edema, no cyanosis, or clubbing.  NEUROLOGIC: Cranial nerves II through XII are intact. Muscle strength 5/5 in all extremities. Sensation intact. Gait not checked.  PSYCHIATRIC: The patient is alert and oriented x 3.  SKIN: No obvious rash, lesion, or ulcer.   DATA REVIEW:   CBC  Recent Labs Lab 07/04/17 0604 07/05/17 0831  WBC 11.6*  --   HGB 9.6* 9.6*  HCT 30.3*  --   PLT 215  --     Chemistries   Recent Labs Lab 06/30/17 1832  07/04/17 0604  NA 138  < > 140  K 4.8  < > 4.2  CL 100*  < > 103  CO2 29  < > 31  GLUCOSE 142*  < > 111*  BUN 48*  < > 57*  CREATININE 2.09*  < > 1.64*  CALCIUM 9.0  < > 8.9  AST 12*  --   --   ALT 11*  --   --   ALKPHOS 118  --   --   BILITOT 0.5  --   --   < > = values in this interval not displayed.  Cardiac Enzymes  Recent Labs Lab 06/30/17 1832  TROPONINI 0.04*     Microbiology Results  Results for orders placed or performed during the hospital encounter of 06/30/17  MRSA PCR  Screening     Status: Abnormal   Collection Time: 06/30/17  9:35 PM  Result Value Ref Range Status   MRSA by PCR POSITIVE (A) NEGATIVE Final    Comment:        The GeneXpert MRSA Assay (FDA approved for NASAL specimens only), is one component of a comprehensive MRSA colonization surveillance program. It is not intended to diagnose MRSA infection nor to guide or monitor treatment for MRSA infections. CRITICAL RESULT CALLED TO, READ BACK BY AND VERIFIED WITH: TESS THOMAS AT 2339 06/30/17 ALV   C difficile quick scan w PCR reflex     Status: Abnormal   Collection Time: 07/01/17  5:30 PM  Result Value Ref Range Status   C Diff antigen POSITIVE (A) NEGATIVE Final   C Diff toxin NEGATIVE NEGATIVE Final   C Diff interpretation Results are indeterminate. See PCR results.  Final  Clostridium Difficile by PCR     Status: Abnormal   Collection Time: 07/01/17  5:30 PM  Result Value Ref Range Status   Toxigenic C Difficile by pcr POSITIVE (A) NEGATIVE Final    Comment: Positive for toxigenic C. difficile with little to no toxin production. Only treat if clinical presentation suggests symptomatic illness.      Management plans discussed with the patient, family and they are in agreement.  CODE STATUS:     Code Status Orders        Start     Ordered   06/30/17 2113  Do not attempt resuscitation (DNR)  Continuous    Question Answer Comment  In the event of cardiac or respiratory ARREST Do not call a "code blue"   In the event of cardiac or respiratory ARREST Do not perform Intubation, CPR, defibrillation or ACLS   In the event of cardiac or respiratory ARREST Use medication by any route, position, wound care, and other measures to relive pain and suffering. May use oxygen, suction and manual treatment of airway obstruction as needed for comfort.   Comments Nurse may  pronounce      06/30/17 2113    Code Status History    Date Active Date Inactive Code Status Order ID Comments User Context   06/30/2017  9:12 PM 06/30/2017  9:13 PM DNR [id number]  Loletha Grayer, MD Inpatient   05/06/2017  9:32 PM 05/16/2017  5:13 PM DNR [id number]  Dustin Flock, MD Inpatient   02/07/2016  4:30 PM 02/11/2016  8:16 PM DNR [id number]  Hillary Bow, MD ED   01/07/2016 10:25 PM 01/13/2016  7:22 PM Full Code [id number]  Theodoro Grist, MD ED    Advance Directive Documentation     Most Recent Value  Type of Advance Directive  Out of facility DNR (pink MOST or yellow form)  Pre-existing out of facility DNR order (yellow form or pink MOST form)  -  "MOST" Form in Place?  -      TOTAL TIME TAKING CARE OF THIS PATIENT: 35 minutes.    Loletha Grayer M.D on 07/05/2017 at 12:33 PM  Between 7am to 6pm - Pager - [phone]  After 6pm go to [email or web address] - password [email or web address]  [facility] Office  [phone]  CC: Primary care physician; Leonel Ramsay, MD

## 2017-07-05 NOTE — Progress Notes (Signed)
Patient continues to tolerate bipap well.

## 2017-07-05 NOTE — Progress Notes (Signed)
Patient given discharge teaching and paperwork regarding medications, diet, follow-up appointments and activity. Patient understanding verbalized. No complaints at this time. IV and telemetry discontinued prior to leaving. Skin assessment as previously charted and vitals are stable; on chronic O2. Patient being discharged to [facility]. Caregiver/family present during discharge teaching. No further needs by Care Management. Facility packet prepared by Randall Hiss, [place]. EMS called for transport.

## 2017-07-05 NOTE — Clinical Social Work Note (Addendum)
CSW confirmed with Tiffany at [facility] they can accept patient today and they do have a bipap that is available for her.  CSW updated patient and her family that [facility] will have bipap available for her.  Bipap settings were faxed earlier in the day.    Patient to be d/c'ed today to [facility].  Patient and family agreeable to plans will transport via ems RN to call report [phone].  Evette Cristal, MSW, [place]

## 2017-07-05 NOTE — Consult Note (Signed)
Consultation Note Date: 07/05/17  Patient Name: Kathy Guzman  DOB: [birth year]  MRN: [id number]  Age / Sex: 82 y.o., female  PCP: Leonel Ramsay, MD Referring Physician: Loletha Grayer, MD  Reason for Consultation: Establishing goals of care  HPI/Patient Profile: 82 y.o. female  with past medical history of COPD on chronic oxygen, CHF, atrial fibrillation, anxiety, depression, hypertension, hyperlipidemia, hypothyroidism, osteoarthritis, recurrent UTI, and GERD admitted on 06/30/2017 with altered mental status. Placed on BiPAP and showed clinical improvement. Patient with acute on chronic respiratory failure with hypercapnia. Will need BiPAP HS. Positive for C. Difficile and receiving oral vancomycin. DNR. Palliative medicine consultation for goals of care.    Clinical Assessment and Goals of Care: I have reviewed medical records, discussed with care team, and met with patient and daughter Kathy Guzman) at bedside to discuss diagnosis, [place], EOL wishes, disposition and options.  Introduced Palliative Medicine as specialized medical care for people living with serious illness. It focuses on providing relief from the symptoms and stress of a serious illness. The goal is to improve quality of life for both the patient and the family.   We discussed a brief life review of the patient. Widowed. Three adult daughters. She has lived at [facility] for 3+ years. Bed bound. Good appetite. 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 "This is how her knees got so bad." Kathy Guzman is primary caregiver but all daughters are involved and supportive.   Discussed hospital diagnoses and interventions. Educated on disease trajectories of COPD and CHF. Patient wearing BiPAP at night and tolerating well. The plan is to continue BiPAP at facility on discharge. Daughter hopeful this will prevent  re-hospitalization due to CO2 build-up.   Advanced directives, concepts specific to code status, and artifical feeding and hydration were discussed. Patient does NOT have a document living will or HCPOA but daughter's turn to [place] to make decisions if patient is not able to. Patient and daughter confirm her wishes for DNR/DNI. Encouraged they complete advanced directive packet will hospitalized. Kathy Guzman is not interested at this time.   Kathy Guzman asks me about prognosis/hospice. She was recently told by facility NP that her mother 6 months or less to live. We again discussed multiple chronic, progressive conditions that are being managed medically but will continue to progress. Unsure of prognosis at this point. Kathy Guzman understands and is realistic. "The decision is out of our hands." Educated on palliative versus hospice services at the facility. I explained I do not feel she is ready for hospice services yet but will have palliative services to follow at facility. Kathy Guzman understands this can transition to hospice services at any time if she should decline or continue to be re-hospitalized.   Questions and concerns were addressed. Therapeutic listening and emotional support provided.    SUMMARY OF RECOMMENDATIONS    DNR/DNI. Continue all other interventions including BiPAP HS.   Daughter agreeable with palliative services to follow at SNF understanding this can transition to hospice services when appropriate.  Likely discharge today if BiPAP is delivered.   Code Status/Advance Care Planning:  DNR  Symptom Management:   Per attending  Palliative Prophylaxis:   Aspiration, Delirium Protocol, Oral Care and Turn Reposition  Additional Recommendations (Limitations, Scope, Preferences):  DNR/DNI. Otherwise, full scope treatment  Psycho-social/Spiritual:   Desire for further Chaplaincy support: yes  Additional Recommendations: Caregiving  Support/Resources and Education on Hospice  Prognosis:     Unable to determine  Discharge Planning: LTC facility with palliative services     Primary Diagnoses: Present on Admission: . Acute respiratory failure with hypercapnia ([place])   I have reviewed the medical record, interviewed the patient and family, and examined the patient. The following aspects are pertinent.  Past Medical History:  Diagnosis Date  . Anxiety   . Atrial fibrillation ([place])   . CHF (congestive heart failure) ([place])   . COPD (chronic obstructive pulmonary disease) ([place])   . Depression   . GERD (gastroesophageal reflux disease)   . Gout   . Hyperlipidemia   . Hypertension   . Hypothyroid   . Osteoarthritis   . Recurrent UTI   . Skin cancer   . Urethral stricture    Social History   Social History  . Marital status: Divorced    Spouse name: N/A  . Number of children: N/A  . Years of education: N/A   Social History Main Topics  . Smoking status: Former Smoker    Packs/day: 0.25    Quit date: 03/18/1994  . Smokeless tobacco: Never Used  . Alcohol use No  . Drug use: No  . Sexual activity: Not Asked   Other Topics Concern  . None   Social History Narrative  . None   Family History  Problem Relation Age of Onset  . Colon cancer Brother   . Heart attack Father   . CVA Mother    Scheduled Meds: . allopurinol  100 mg Oral Daily  . apixaban  2.5 mg Oral BID  . Chlorhexidine Gluconate Cloth  6 each Topical Q0600  . diltiazem  120 mg Oral Daily  . feeding supplement (PRO-STAT SUGAR FREE 64)  30 mL Oral BID  . fluticasone  2 spray Each Nare Daily  . gabapentin  200 mg Oral BID  . ipratropium-albuterol  3 mL Nebulization Q6H WA  . levothyroxine  75 mcg Oral QAC breakfast  . mometasone-formoterol  2 puff Inhalation BID  . mupirocin ointment  1 application Nasal BID  . pantoprazole  40 mg Oral BID  . PARoxetine  30 mg Oral Daily  . QUEtiapine  25 mg Oral QHS  . saccharomyces boulardii  250 mg Oral BID  . vancomycin  125 mg Oral Q6H    Continuous Infusions: PRN Meds:.acetaminophen **OR** acetaminophen, hydrOXYzine, nystatin cream, traZODone Medications Prior to Admission:  Prior to Admission medications   Medication Sig Start Date End Date Taking? Authorizing Provider  acetaminophen (TYLENOL) 325 MG tablet Take 650 mg by mouth every 4 (four) hours as needed.   Yes [provider]  albuterol (PROVENTIL HFA;VENTOLIN HFA) 108 (90 BASE) MCG/ACT inhaler Inhale 2 puffs into the lungs every 4 (four) hours as needed for wheezing or shortness of breath.   Yes [provider]  allopurinol (ZYLOPRIM) 100 MG tablet Take 100 mg by mouth daily.    Yes [provider]  alum & mag hydroxide-simeth (MAALOX/MYLANTA) 200-200-20 MG/5ML suspension Take 30 mLs by mouth every 4 (four) hours as needed for indigestion or heartburn.   Yes  [provider]  apixaban (ELIQUIS) 5 MG TABS tablet Take 1 tablet (5 mg total) by mouth 2 (two) times daily. 05/16/17  Yes Epifanio Lesches, MD  bisacodyl (DULCOLAX) 5 MG EC tablet Take 1 tablet (5 mg total) by mouth daily as needed for moderate constipation. 02/11/16  Yes Gouru, Illene Silver, MD  budesonide-formoterol (SYMBICORT) 160-4.5 MCG/ACT inhaler Inhale 2 puffs into the lungs 2 (two) times daily.   Yes [provider]  calcium carbonate (TUMS) 500 MG chewable tablet Chew 1 tablet by mouth every 4 (four) hours as needed for indigestion or heartburn.   Yes [provider]  carvedilol (COREG) 3.125 MG tablet Take 1 tablet (3.125 mg total) by mouth 2 (two) times daily. 05/16/17 05/16/18 Yes Epifanio Lesches, MD  cyanocobalamin 500 MCG tablet Take 500 mcg by mouth 2 (two) times daily.   Yes [provider]  diltiazem (CARDIZEM CD) 240 MG 24 hr capsule Take 1 capsule (240 mg total) by mouth daily. 05/16/17  Yes Epifanio Lesches, MD  docusate sodium (COLACE) 100 MG capsule Take 1 capsule (100 mg total) by mouth 2 (two) times daily. 02/11/16  Yes Gouru,  Illene Silver, MD  ferrous sulfate 325 (65 FE) MG tablet Take 325 mg by mouth 2 (two) times daily with a meal.    Yes [provider]  fluticasone (FLONASE) 50 MCG/ACT nasal spray Place 2 sprays into both nostrils daily. 01/13/16  Yes Dustin Flock, MD  furosemide (LASIX) 20 MG tablet Take 2 tablets (40 mg total) by mouth daily. Patient taking differently: Take 40 mg by mouth 2 (two) times daily.  01/13/16  Yes Dustin Flock, MD  gabapentin (NEURONTIN) 100 MG capsule Take 200 mg by mouth 2 (two) times daily.    Yes [provider]  hydrOXYzine (ATARAX/VISTARIL) 25 MG tablet Take 25 mg by mouth every 6 (six) hours as needed.   Yes [provider]  ipratropium-albuterol (DUONEB) 0.5-2.5 (3) MG/3ML SOLN Take 3 mLs by nebulization every 6 (six) hours. 01/13/16  Yes Dustin Flock, MD  levothyroxine (SYNTHROID, LEVOTHROID) 75 MCG tablet Take 75 mcg by mouth daily before breakfast.   Yes [provider]  losartan (COZAAR) 50 MG tablet Take 50 mg by mouth daily.   Yes [provider]  nystatin cream (MYCOSTATIN) Apply 1 application topically every 8 (eight) hours as needed (for yeast). Pt applies to all skin folds.   Yes [provider]  omeprazole (PRILOSEC) 20 MG capsule Take 20 mg by mouth daily.    Yes [provider]  PARoxetine (PAXIL) 30 MG tablet Take 30 mg by mouth daily.   Yes [provider]  QUEtiapine (SEROQUEL) 25 MG tablet Take 25 mg by mouth at bedtime.    Yes [provider]  saccharomyces boulardii (FLORASTOR) 250 MG capsule Take 250 mg by mouth 2 (two) times daily.   Yes [provider]  Skin Protectants, Misc. (CALAZIME SKIN PROTECTANT EX) Apply 1 application topically 4 (four) times daily as needed (for rash). Pt applies to buttocks.   Yes [provider]  Vitamin D, Ergocalciferol, (DRISDOL) 50000 units CAPS capsule Take 50,000 Units by mouth every 30 (thirty) days.   Yes [provider]  Amino Acids-Protein Hydrolys (FEEDING SUPPLEMENT, PRO-STAT SUGAR FREE 64,) LIQD Take 30 mLs by mouth 2 (two) times daily. 02/11/16   Gouru, Illene Silver, MD  Cranberry-Vitamin C-Inulin (UTI-STAT) LIQD Take 30 mLs by mouth daily.    [provider]  valsartan (DIOVAN) 80 MG tablet Take 80  mg by mouth daily.     [provider]   Allergies  Allergen Reactions  . Nsaids Other (See Comments)    Reaction:  Unknown   . Tramadol Itching   Review of Systems  Respiratory: Positive for shortness of breath and wheezing.   Gastrointestinal: Positive for diarrhea.   Physical Exam  Constitutional: She is cooperative. She appears ill.  HENT:  Head: Normocephalic and atraumatic.  Cardiovascular: Regular rhythm.   Pulmonary/Chest: No accessory muscle usage. No tachypnea. No respiratory distress. She has decreased breath sounds.  Abdominal: Normal appearance.  Neurological: She is alert.  Oriented to person, place, time. Periods of confusion  Skin: Skin is warm and dry.  Psychiatric: She has a normal mood and affect. Her speech is normal and behavior is normal.  Nursing note and vitals reviewed.  Vital Signs: BP (!) 127/59 (BP Location: Right Arm)   Guzman 85   Temp 98.3 F (36.8 C) (Oral)   Resp 18   Ht 5' 2"  (1.575 m)   Wt (!) 151.3 kg (333 lb 8.9 oz)   SpO2 97%   BMI 61.01 kg/m  Pain Assessment: No/denies pain POSS *See Group Information*: S-Acceptable,Sleep, easy to arouse Pain Score: 0-No pain  SpO2: SpO2: 97 % O2 Device:SpO2: 97 % O2 Flow Rate: .O2 Flow Rate (L/min): 5 L/min  IO: Intake/output summary:   Intake/Output Summary (Last 24 hours) at 07/05/17 1112 Last data filed at 07/05/17 0646  Gross per 24 hour  Intake                0 ml  Output             1000 ml  Net            -1000 ml    LBM: Last BM Date: 07/04/17 Baseline Weight: Weight: (!) 161.3 kg (355 lb 8 oz) Most recent weight: Weight: (!) 151.3 kg (333 lb 8.9 oz)     Palliative  Assessment/Data: PPS 30%   Flowsheet Rows     Most Recent Value  Intake Tab  Referral Department  Hospitalist  Unit at Time of Referral  Cardiac/Telemetry Unit  Palliative Care Primary Diagnosis  Pulmonary  Palliative Care Type  New Palliative care  Reason for referral  Clarify Goals of Care  Date first seen by Palliative Care  07/05/17  Clinical Assessment  Palliative Performance Scale Score  30%  Psychosocial & Spiritual Assessment  Palliative Care Outcomes  Patient/Family meeting held?  Yes  Who was at the meeting?  patient and daughter Kathy Guzman)  Palliative Care Outcomes  Clarified goals of care, Provided psychosocial or spiritual support, ACP counseling assistance, Counseled regarding hospice, Linked to palliative care logitudinal support      Time In: 1000 Time Out: 1110 Time Total: 69mn Greater than 50%  of this time was spent counseling and coordinating care related to the above assessment and plan.  Signed by:  MIhor Dow FNP-C Palliative Medicine Team  Phone: [phone]: [phone]  Please contact Palliative Medicine Team phone at [phone] questions and concerns.  For individual provider: See AShea Evans

## 2017-07-05 NOTE — Care Management Important Message (Signed)
Important Message  Patient Details  Name: Kathy Guzman MRN: [id number] Date of Birth: 1935/08/21   Medicare Important Message Given:  Yes    Katrina Stack, RN 07/05/2017, 12:34 PM

## 2017-07-06 DIAGNOSIS — Z515 Encounter for palliative care: Secondary | ICD-10-CM

## 2017-07-06 DIAGNOSIS — J189 Pneumonia, unspecified organism: Secondary | ICD-10-CM

## 2017-07-06 DIAGNOSIS — Z7189 Other specified counseling: Secondary | ICD-10-CM

## 2017-07-06 LAB — BLOOD GAS, VENOUS
ACID-BASE EXCESS: 4.8 mmol/L — AB (ref 0.0–2.0)
BICARBONATE: 35.1 mmol/L — AB (ref 20.0–28.0)
O2 Saturation: 58.5 %
PATIENT TEMPERATURE: 37
pCO2, Ven: 94 mmHg (ref 44.0–60.0)
pH, Ven: 7.18 — CL (ref 7.250–7.430)
pO2, Ven: 39 mmHg (ref 32.0–45.0)

## 2017-07-06 NOTE — Care Management Note (Signed)
Case Management Note  Patient Details  Name: Kathy Guzman MRN: [id number] Date of Birth: Aug 11, 1935  Subjective/Objective:                Received call from Clayton Lefort- patient's daughter.  She relayed that [facility] did not have a bipap machine that was ordered for patient to use at hs.    Action/Plan: Informed CSW  Expected Discharge Date:  07/05/17               Expected Discharge Plan:     In-House Referral:     Discharge planning Services     Post Acute Care Choice:    Choice offered to:     DME Arranged:    DME Agency:     HH Arranged:    HH Agency:     Status of Service:     If discussed at [employer], dates discussed:    Additional Comments:  Katrina Stack, RN 07/06/2017, 8:59 AM

## 2017-07-06 NOTE — Clinical Social Work Note (Signed)
07-06-17 CSW received phone call from case manager that patient's daughter called and said that patient did not get her bipap last night even though SNF confirmed they did have one for patient.  CSW contacted Tiffany at [facility] and she said Clinical biochemist of nursing informed her they would be able to provide it.  Tiffany was informed today that the bipap was not working and they had to order new parts for it.  Patient was on oxygen overnight, respiratory therapy at SNF is working on getting patient's Bipap ready for her to use.  CSW spoke to patient's daughter and informed her that CSW has spokent to [facility] as well about patient not having Bipap last night.  Jones Broom. [place], MSW, [place]  07/06/2017 9:47 AM

## 2018-11-02 DEATH — deceased
